# Patient Record
Sex: Female | Born: 1965 | Race: White | Hispanic: No | Marital: Married | Smoking: Former smoker
Health system: Southern US, Community
[De-identification: ages and names within clinical notes are randomized; demographics above are authoritative.]

## PROBLEM LIST (undated history)

## (undated) DIAGNOSIS — F1010 Alcohol abuse, uncomplicated: Secondary | ICD-10-CM

## (undated) DIAGNOSIS — I10 Essential (primary) hypertension: Secondary | ICD-10-CM

## (undated) DIAGNOSIS — Z90710 Acquired absence of both cervix and uterus: Secondary | ICD-10-CM

## (undated) DIAGNOSIS — F32A Depression, unspecified: Secondary | ICD-10-CM

## (undated) DIAGNOSIS — I480 Paroxysmal atrial fibrillation: Secondary | ICD-10-CM

## (undated) DIAGNOSIS — I4891 Unspecified atrial fibrillation: Secondary | ICD-10-CM

## (undated) HISTORY — DX: Essential (primary) hypertension: I10

## (undated) HISTORY — DX: Depression, unspecified: F32.A

## (undated) HISTORY — DX: Acquired absence of both cervix and uterus: Z90.710

## (undated) HISTORY — PX: ABDOMINAL HYSTERECTOMY: SHX81

## (undated) HISTORY — PX: CHOLECYSTECTOMY: SHX55

## (undated) HISTORY — PX: TONSILLECTOMY: SUR1361

## (undated) HISTORY — PX: TOTAL ABDOMINAL HYSTERECTOMY: SHX209

## (undated) HISTORY — PX: SMALL INTESTINE SURGERY: SHX150

---

## 2000-03-04 ENCOUNTER — Emergency Department (HOSPITAL_COMMUNITY): Admission: EM | Admit: 2000-03-04 | Discharge: 2000-03-04 | Payer: Self-pay | Admitting: Emergency Medicine

## 2000-03-05 ENCOUNTER — Other Ambulatory Visit (HOSPITAL_COMMUNITY): Admission: RE | Admit: 2000-03-05 | Discharge: 2000-03-18 | Payer: Self-pay | Admitting: Psychiatry

## 2001-09-26 ENCOUNTER — Emergency Department (HOSPITAL_COMMUNITY): Admission: EM | Admit: 2001-09-26 | Discharge: 2001-09-26 | Payer: Self-pay | Admitting: Emergency Medicine

## 2001-09-26 ENCOUNTER — Encounter: Payer: Self-pay | Admitting: Emergency Medicine

## 2001-09-27 ENCOUNTER — Emergency Department (HOSPITAL_COMMUNITY): Admission: EM | Admit: 2001-09-27 | Discharge: 2001-09-27 | Payer: Self-pay | Admitting: Emergency Medicine

## 2002-01-27 ENCOUNTER — Emergency Department (HOSPITAL_COMMUNITY): Admission: EM | Admit: 2002-01-27 | Discharge: 2002-01-28 | Payer: Self-pay | Admitting: *Deleted

## 2002-01-27 ENCOUNTER — Encounter: Payer: Self-pay | Admitting: Emergency Medicine

## 2002-01-28 ENCOUNTER — Emergency Department (HOSPITAL_COMMUNITY): Admission: EM | Admit: 2002-01-28 | Discharge: 2002-01-28 | Payer: Self-pay | Admitting: Emergency Medicine

## 2002-03-18 ENCOUNTER — Inpatient Hospital Stay (HOSPITAL_COMMUNITY): Admission: EM | Admit: 2002-03-18 | Discharge: 2002-03-19 | Payer: Self-pay | Admitting: Emergency Medicine

## 2020-10-18 ENCOUNTER — Encounter: Payer: Self-pay | Admitting: Internal Medicine

## 2020-10-18 ENCOUNTER — Other Ambulatory Visit: Payer: Self-pay

## 2020-10-18 ENCOUNTER — Ambulatory Visit (INDEPENDENT_AMBULATORY_CARE_PROVIDER_SITE_OTHER): Payer: 59 | Admitting: Internal Medicine

## 2020-10-18 VITALS — BP 143/90 | HR 81 | Temp 98.5°F | Resp 18 | Ht 65.0 in | Wt 262.0 lb

## 2020-10-18 DIAGNOSIS — K219 Gastro-esophageal reflux disease without esophagitis: Secondary | ICD-10-CM

## 2020-10-18 DIAGNOSIS — Z720 Tobacco use: Secondary | ICD-10-CM | POA: Diagnosis not present

## 2020-10-18 DIAGNOSIS — E669 Obesity, unspecified: Secondary | ICD-10-CM | POA: Insufficient documentation

## 2020-10-18 DIAGNOSIS — Z7689 Persons encountering health services in other specified circumstances: Secondary | ICD-10-CM

## 2020-10-18 DIAGNOSIS — F17210 Nicotine dependence, cigarettes, uncomplicated: Secondary | ICD-10-CM

## 2020-10-18 DIAGNOSIS — F320 Major depressive disorder, single episode, mild: Secondary | ICD-10-CM | POA: Diagnosis not present

## 2020-10-18 DIAGNOSIS — Z1231 Encounter for screening mammogram for malignant neoplasm of breast: Secondary | ICD-10-CM

## 2020-10-18 DIAGNOSIS — I10 Essential (primary) hypertension: Secondary | ICD-10-CM | POA: Insufficient documentation

## 2020-10-18 DIAGNOSIS — Z23 Encounter for immunization: Secondary | ICD-10-CM

## 2020-10-18 DIAGNOSIS — F322 Major depressive disorder, single episode, severe without psychotic features: Secondary | ICD-10-CM | POA: Insufficient documentation

## 2020-10-18 DIAGNOSIS — Z122 Encounter for screening for malignant neoplasm of respiratory organs: Secondary | ICD-10-CM

## 2020-10-18 DIAGNOSIS — Z1211 Encounter for screening for malignant neoplasm of colon: Secondary | ICD-10-CM

## 2020-10-18 DIAGNOSIS — F321 Major depressive disorder, single episode, moderate: Secondary | ICD-10-CM | POA: Insufficient documentation

## 2020-10-18 NOTE — Assessment & Plan Note (Signed)
Low-dose CT chest ordered

## 2020-10-18 NOTE — Assessment & Plan Note (Signed)
On Nexium 

## 2020-10-18 NOTE — Assessment & Plan Note (Signed)
Diet modification and moderate exercise/walking advised 

## 2020-10-18 NOTE — Assessment & Plan Note (Signed)
35 pack-year smoking history, currently smokes 1 cigarette in week.  Asked about quitting: confirms that she currently smokes cigarettes Advise to quit smoking: Educated about QUITTING to reduce the risk of cancer, cardio and cerebrovascular disease. Assess willingness: Unwilling to quit at this time, but is working on cutting back. Assist with counseling and pharmacotherapy: Counseled for 5 minutes and literature provided. Arrange for follow up: Follow up in 3 months and continue to offer help.

## 2020-10-18 NOTE — Assessment & Plan Note (Addendum)
On Wellbutrin, well-controlled Was initially started for smoking cessation Melatonin as needed for insomnia

## 2020-10-18 NOTE — Progress Notes (Signed)
New Patient Office Visit  Subjective:  Patient ID: Renee [name], female    DOB: [birth year]  Age: 54 y.o. MRN: [id number]  CC:  Chief Complaint  Patient presents with  . New Patient (Initial Visit)    new pt just moved here from [place]     HPI Renee [name] is a 54 year old female with past medical history of uncontrolled hypertension, GERD, depression and insomnia who presents for establishing care.  Patient has a history of uncontrolled hypertension, for which she takes multiple medications.  She states that she takes her medications regularly.  Her blood pressure was 143/90 in the office today.  She used to check her BP at home, but has not been checking it recently.  She denies any headache, dizziness, chest pain, dyspnea or palpitations.  She has 25-pack-year smoking history, but has been smoking only 1 cigarette in a week for the last 3 years.  Wellbutrin was started initially to help with smoking cessation, but she continued to take it as she was also suffering from mild depression and prefers to continue it for now.  She also takes Nexium for GERD.  She takes melatonin as needed for insomnia.  Patient has had 2 doses of COVID vaccine.  She received flu vaccine and Shingrix vaccine (first dose) in the office today.  Past Medical History:  Diagnosis Date  . Depression    [facility] 10/17/2020  . Hypertension    [employer] 10/17/2020    Past Surgical History:  Procedure Laterality Date  . ABDOMINAL HYSTERECTOMY N/A    [facility] 10/17/2020  . SMALL INTESTINE SURGERY N/A    [facility] 10/17/2020  . TONSILLECTOMY      History reviewed. No pertinent family history.  Social History   Socioeconomic History  . Marital status: Married    Spouse name: Not on file  . Number of children: Not on file  . Years of education: Not on file  . Highest education level: Not on file  Occupational History  . Not on file  Tobacco Use  . Smoking status: Current Some Day Smoker     Types: Cigarettes  . Smokeless tobacco: Never Used  . Tobacco comment: 35 pack-year  Substance and Sexual Activity  . Alcohol use: Yes  . Drug use: Never  . Sexual activity: Yes  Other Topics Concern  . Not on file  Social History Narrative  . Not on file   Social Determinants of Health   Financial Resource Strain:   . Difficulty of Paying Living Expenses: Not on file  Food Insecurity:   . Worried About Charity fundraiser in the Last Year: Not on file  . Ran Out of Food in the Last Year: Not on file  Transportation Needs:   . Lack of Transportation (Medical): Not on file  . Lack of Transportation (Non-Medical): Not on file  Physical Activity:   . Days of Exercise per Week: Not on file  . Minutes of Exercise per Session: Not on file  Stress:   . Feeling of Stress : Not on file  Social Connections:   . Frequency of Communication with Friends and Family: Not on file  . Frequency of Social Gatherings with Friends and Family: Not on file  . Attends Religious Services: Not on file  . Active Member of Clubs or Organizations: Not on file  . Attends Archivist Meetings: Not on file  . Marital Status: Not on file  Intimate Partner Violence:   . Fear  of Current or Ex-Partner: Not on file  . Emotionally Abused: Not on file  . Physically Abused: Not on file  . Sexually Abused: Not on file    ROS Review of Systems  Constitutional: Negative for chills and fever.  HENT: Negative for congestion, sinus pressure, sinus pain and sore throat.   Eyes: Negative for pain and discharge.  Respiratory: Negative for cough and shortness of breath.   Cardiovascular: Negative for chest pain and palpitations.  Gastrointestinal: Negative for abdominal pain, constipation, diarrhea, nausea and vomiting.  Endocrine: Negative for polydipsia and polyuria.  Genitourinary: Negative for dysuria and hematuria.  Musculoskeletal: Negative for neck pain and neck stiffness.  Skin: Negative for rash.   Neurological: Negative for dizziness and weakness.  Psychiatric/Behavioral: Negative for agitation and behavioral problems.    Objective:   Today's Vitals: BP (!) 143/90 (BP Location: Right Arm, Patient Position: Sitting, Cuff Size: Normal)   Pulse 81   Temp 98.5 F (36.9 C) (Oral)   Resp 18   Ht 5' 5"  (1.651 m)   Wt 262 lb (118.8 kg)   SpO2 98%   BMI 43.60 kg/m   Physical Exam Vitals reviewed.  Constitutional:      General: She is not in acute distress.    Appearance: She is obese. She is not diaphoretic.  HENT:     Head: Normocephalic and atraumatic.     Nose: Nose normal.     Mouth/Throat:     Mouth: Mucous membranes are moist.  Eyes:     General: No scleral icterus.    Extraocular Movements: Extraocular movements intact.     Pupils: Pupils are equal, round, and reactive to light.  Cardiovascular:     Rate and Rhythm: Normal rate and regular rhythm.     Pulses: Normal pulses.     Heart sounds: Normal heart sounds. No murmur heard.   Pulmonary:     Breath sounds: Normal breath sounds. No wheezing or rales.  Abdominal:     Palpations: Abdomen is soft.     Tenderness: There is no abdominal tenderness.  Musculoskeletal:     Cervical back: Neck supple. No tenderness.     Right lower leg: No edema.     Left lower leg: No edema.  Skin:    General: Skin is warm.     Findings: No rash.  Neurological:     General: No focal deficit present.     Mental Status: She is alert and oriented to person, place, and time.     Sensory: No sensory deficit.     Motor: No weakness.  Psychiatric:        Mood and Affect: Mood normal.        Behavior: Behavior normal.     Assessment & Plan:   Problem List Items Addressed This Visit      Cardiovascular and Mediastinum   Primary hypertension    BP Readings from Last 1 Encounters:  10/18/20 (!) 143/90   Elevated likely in the setting of anxiety/nervousness in a new place Advised to check BP at home and contact us if BP is  persistently elevated more than 140/90 On atenolol 50 mg daily, benazepril 40 mg daily, chlorthalidone 25 mg daily and clonidine 0.1 mg daily Counseled for compliance with the medications Advised DASH diet and moderate exercise/walking, at least 150 mins/week       Relevant Medications   benazepril (LOTENSIN) 40 MG tablet   cloNIDine (CATAPRES) 0.1 MG tablet   chlorthalidone (HYGROTON)  25 MG tablet   atenolol (TENORMIN) 50 MG tablet     Other   Encounter to establish care - Primary   Relevant Orders   CBC with Differential   CMP14+EGFR   Hemoglobin A1c   Lipid panel   TSH + free T4   Vitamin D (25 hydroxy)   Hepatitis C Antibody   Depression, major, single episode, mild (HCC)    On Wellbutrin, well-controlled Was initially started for smoking cessation      Relevant Medications   buPROPion (WELLBUTRIN XL) 300 MG 24 hr tablet   Tobacco abuse    35 pack-year smoking history, currently smokes 1 cigarette in week.  Asked about quitting: confirms that she currently smokes cigarettes Advise to quit smoking: Educated about QUITTING to reduce the risk of cancer, cardio and cerebrovascular disease. Assess willingness: Unwilling to quit at this time, but is working on cutting back. Assist with counseling and pharmacotherapy: Counseled for 5 minutes and literature provided. Arrange for follow up: Follow up in 3 months and continue to offer help.      Encounter for screening for lung cancer    Low-dose CT chest ordered      Relevant Orders   CT CHEST LUNG CA SCREEN LOW DOSE W/O CM    Other Visit Diagnoses    Special screening for malignant neoplasms, colon       Relevant Orders   Ambulatory referral to Gastroenterology   Screening mammogram for breast cancer       Relevant Orders   MM Digital Screening   Need for viral immunization       Relevant Orders   Varicella-zoster vaccine IM (Shingrix) (Completed)      Outpatient Encounter Medications as of 10/18/2020   Medication Sig  . atenolol (TENORMIN) 50 MG tablet Take 50 mg by mouth daily.  . benazepril (LOTENSIN) 40 MG tablet Take 40 mg by mouth daily. Take 1 tablet at bedtime  . bimatoprost (LATISSE) 0.03 % ophthalmic solution Place into both eyes at bedtime. Place one drop on applicator and apply evenly along the skin of the upper eyelid at base of eyelashes once daily at bedtime; repeat procedure for second eye (use a clean applicator).  Marland Kitchen buPROPion (WELLBUTRIN XL) 300 MG 24 hr tablet Take 300 mg by mouth daily. Take 1 daily  . chlorthalidone (HYGROTON) 25 MG tablet Take 25 mg by mouth daily. Take 1 tablet daily  . cloNIDine (CATAPRES) 0.1 MG tablet Take 0.1 mg by mouth daily. Take 1 tablet daily  . esomeprazole (NEXIUM) 40 MG capsule Take 40 mg by mouth daily at 12 noon. Take 1 daily  . melatonin 5 MG TABS Take 5 mg by mouth. At night as needed  . [DISCONTINUED] cloNIDine (CATAPRES - DOSED IN MG/24 HR) 0.1 mg/24hr patch Place 0.1 mg onto the skin once a week.   No facility-administered encounter medications on file as of 10/18/2020.    Follow-up: Return in about 4 months (around 02/16/2021).   Lindell Spar, MD

## 2020-10-18 NOTE — Assessment & Plan Note (Signed)
BP Readings from Last 1 Encounters:  10/18/20 (!) 143/90   Elevated likely in the setting of anxiety/nervousness in a new place Advised to check BP at home and contact us if BP is persistently elevated more than 140/90 On atenolol 50 mg daily, benazepril 40 mg daily, chlorthalidone 25 mg daily and clonidine 0.1 mg daily Counseled for compliance with the medications Advised DASH diet and moderate exercise/walking, at least 150 mins/week

## 2020-10-18 NOTE — Patient Instructions (Addendum)
Please continue to take medications as prescribed.  Please get fasting blood tests done soon.  You are being scheduled for the following: - Mammography for breast cancer screening - GI referral for colonoscopy - Low dose CT chest for lung cancer screening  You were given flu and Shingrix (1st dose) in the office today.  Please follow DASH diet and perform moderate exercise/walking at least 150 mins/week.  DASH stands for Dietary Approaches to Stop Hypertension. The DASH diet is a healthy-eating plan designed to help treat or prevent high blood pressure (hypertension).  The DASH diet includes foods that are rich in potassium, calcium and magnesium. These nutrients help control blood pressure. The diet limits foods that are high in sodium, saturated fat and added sugars.  Studies have shown that the DASH diet can lower blood pressure in as little as two weeks. The diet can also lower low-density lipoprotein (LDL or "bad") cholesterol levels in the blood. High blood pressure and high LDL cholesterol levels are two major risk factors for heart disease and stroke.    DASH diet: Recommended servings The DASH diet provides daily and weekly nutritional goals. The number of servings you should have depends on your daily calorie needs.  Here's a look at the recommended servings from each food group for a 2,000-calorie-a-day DASH diet:  Grains: 6 to 8 servings a day. One serving is one slice bread, 1 ounce dry cereal, or 1/2 cup cooked cereal, rice or pasta. Vegetables: 4 to 5 servings a day. One serving is 1 cup raw leafy green vegetable, 1/2 cup cut-up raw or cooked vegetables, or 1/2 cup vegetable juice. Fruits: 4 to 5 servings a day. One serving is one medium fruit, 1/2 cup fresh, frozen or canned fruit, or 1/2 cup fruit juice. Fat-free or low-fat dairy products: 2 to 3 servings a day. One serving is 1 cup milk or yogurt, or 1 1/2 ounces cheese. Lean meats, poultry and fish: six 1-ounce  servings or fewer a day. One serving is 1 ounce cooked meat, poultry or fish, or 1 egg. Nuts, seeds and legumes: 4 to 5 servings a week. One serving is 1/3 cup nuts, 2 tablespoons peanut butter, 2 tablespoons seeds, or 1/2 cup cooked legumes (dried beans or peas). Fats and oils: 2 to 3 servings a day. One serving is 1 teaspoon soft margarine, 1 teaspoon vegetable oil, 1 tablespoon mayonnaise or 2 tablespoons salad dressing. Sweets and added sugars: 5 servings or fewer a week. One serving is 1 tablespoon sugar, jelly or jam, 1/2 cup sorbet, or 1 cup lemonade.

## 2020-10-19 ENCOUNTER — Encounter (INDEPENDENT_AMBULATORY_CARE_PROVIDER_SITE_OTHER): Payer: Self-pay | Admitting: *Deleted

## 2020-10-25 ENCOUNTER — Other Ambulatory Visit (HOSPITAL_COMMUNITY): Payer: Self-pay

## 2020-10-25 ENCOUNTER — Encounter (HOSPITAL_COMMUNITY): Payer: Self-pay

## 2020-10-25 DIAGNOSIS — Z122 Encounter for screening for malignant neoplasm of respiratory organs: Secondary | ICD-10-CM

## 2020-10-25 DIAGNOSIS — Z87891 Personal history of nicotine dependence: Secondary | ICD-10-CM

## 2020-10-25 NOTE — Progress Notes (Signed)
Received referral for initial lung cancer screening scan. Contacted patient and obtained smoking history (started age 54 smoking 1/4 PPD until the age of 48 when she began smoking 1/2 PPD. At the age of 74, she began smoking 1PPD until the age of 67 when she stopped smoking, former smoker having quit 2 years ago, 33 pack year history) as well as answering questions related to the screening process. Patient denies signs/symptoms of lung cancer such as weight loss or hemoptysis. Patient denies comorbidity that would prevent curative treatment if lung cancer were to be found. Patient to be scheduled for [facility] and LDCT.

## 2020-10-25 NOTE — Progress Notes (Signed)
Patient scheduled for Lung Cancer Screening [facility] on 11/17/20 and initial LDCT on 11/18/20. Patient aware.

## 2020-11-09 ENCOUNTER — Ambulatory Visit (HOSPITAL_COMMUNITY): Payer: 59

## 2020-11-17 ENCOUNTER — Inpatient Hospital Stay (HOSPITAL_COMMUNITY): Payer: 59 | Attending: Oncology | Admitting: Oncology

## 2020-11-17 DIAGNOSIS — Z87891 Personal history of nicotine dependence: Secondary | ICD-10-CM | POA: Diagnosis not present

## 2020-11-17 NOTE — Progress Notes (Signed)
Virtual Visit via Video Note  I connected with Renee [name] on 11/17/20 at 10:50 AM EST by a video enabled telemedicine application and verified that I am speaking with the correct person using two identifiers.  Location: Patient: Home Provider: Clinic    I discussed the limitations of evaluation and management by telemedicine and the availability of in person appointments. The patient expressed understanding and agreed to proceed.  I discussed the assessment and treatment plan with the patient. The patient was provided an opportunity to ask questions and all were answered. The patient agreed with the plan and demonstrated an understanding of the instructions.   The patient was advised to call back or seek an in-person evaluation if the symptoms worsen or if the condition fails to improve as anticipated.   In accordance with CMS guidelines, patient has met eligibility criteria including age, absence of signs or symptoms of lung cancer.  Social History   Tobacco Use  . Smoking status: Current Some Day Smoker    Types: Cigarettes  . Smokeless tobacco: Never Used  . Tobacco comment: 35 pack-year  Substance Use Topics  . Alcohol use: Yes  . Drug use: Never      A shared decision-making session was conducted prior to the performance of CT scan. This includes one or more decision aids, includes benefits and harms of screening, follow-up diagnostic testing, over-diagnosis, false positive rate, and total radiation exposure.   Counseling on the importance of adherence to annual lung cancer LDCT screening, impact of co-morbidities, and ability or willingness to undergo diagnosis and treatment is imperative for compliance of the program.   Counseling on the importance of continued smoking cessation for former smokers; the importance of smoking cessation for current smokers, and information about tobacco cessation interventions have been given to patient including [place] and [address]  quit [place] programs.   Written order for lung cancer screening with LDCT has been given to the patient and any and all questions have been answered to the best of my abilities.    Yearly follow up will be coordinated by Burgess Estelle, Thoracic Navigator.  I provided 15 minutes of face-to-face video visit time during this encounter, and > 50% was spent counseling as documented under my assessment & plan.   Jacquelin Hawking, NP

## 2020-11-18 ENCOUNTER — Other Ambulatory Visit: Payer: Self-pay

## 2020-11-18 ENCOUNTER — Ambulatory Visit (HOSPITAL_COMMUNITY)
Admission: RE | Admit: 2020-11-18 | Discharge: 2020-11-18 | Disposition: A | Discharge status: Home / Self Care | Payer: 59 | Source: Ambulatory Visit | Attending: Oncology | Admitting: Oncology

## 2020-11-18 DIAGNOSIS — Z87891 Personal history of nicotine dependence: Secondary | ICD-10-CM | POA: Insufficient documentation

## 2020-11-18 DIAGNOSIS — Z122 Encounter for screening for malignant neoplasm of respiratory organs: Secondary | ICD-10-CM | POA: Diagnosis present

## 2020-11-21 ENCOUNTER — Encounter (HOSPITAL_COMMUNITY): Payer: Self-pay

## 2020-11-21 ENCOUNTER — Other Ambulatory Visit: Payer: Self-pay

## 2020-11-21 ENCOUNTER — Ambulatory Visit (HOSPITAL_COMMUNITY)
Admission: RE | Admit: 2020-11-21 | Discharge: 2020-11-21 | Disposition: A | Discharge status: Home / Self Care | Payer: 59 | Source: Ambulatory Visit | Attending: Internal Medicine | Admitting: Internal Medicine

## 2020-11-21 DIAGNOSIS — Z1231 Encounter for screening mammogram for malignant neoplasm of breast: Secondary | ICD-10-CM | POA: Insufficient documentation

## 2020-11-21 NOTE — Progress Notes (Signed)
Patient notified of LDCT Lung Cancer Screening Results via mail with the recommendation to follow-up in 12 months. Patient's referring provider has been sent a copy of results. Results are as follows:  IMPRESSION: 1. Lung-RADS 2, benign appearance or behavior. Continue annual screening with low-dose chest CT without contrast in 12 months. 2. Aortic Atherosclerosis (ICD10-I70.0) and Emphysema (ICD10-J43.9). 

## 2020-11-22 ENCOUNTER — Other Ambulatory Visit: Payer: Self-pay | Admitting: *Deleted

## 2020-11-22 MED ORDER — BENAZEPRIL HCL 40 MG PO TABS
40.0000 mg | ORAL_TABLET | Freq: Every day | ORAL | 2 refills | Status: DC
Start: 2020-11-22 — End: 2021-02-16

## 2020-11-22 MED ORDER — BIMATOPROST 0.03 % EX SOLN: 5.0000 mL | Freq: Every day | CUTANEOUS | 2 refills | Status: DC

## 2020-12-01 ENCOUNTER — Ambulatory Visit: Payer: 59

## 2020-12-06 ENCOUNTER — Telehealth: Payer: Self-pay

## 2020-12-06 NOTE — Telephone Encounter (Signed)
PT is calling to get her mammogram report. She has not heard anything

## 2020-12-06 NOTE — Telephone Encounter (Signed)
It appears that it has not been read yet. If she doesn't hear from them in a week, we will follow up with Radiology department.

## 2020-12-06 NOTE — Telephone Encounter (Signed)
Please advise 

## 2020-12-07 NOTE — Telephone Encounter (Signed)
Pt got results yesterday per pt

## 2020-12-16 LAB — CBC WITH DIFFERENTIAL/PLATELET
Basophils Absolute: 0 10*3/uL (ref 0.0–0.2)
Basos: 1 %
EOS (ABSOLUTE): 0.1 10*3/uL (ref 0.0–0.4)
Eos: 2 %
Hematocrit: 38.5 % (ref 34.0–46.6)
Hemoglobin: 13 g/dL (ref 11.1–15.9)
Immature Grans (Abs): 0 10*3/uL (ref 0.0–0.1)
Immature Granulocytes: 0 %
Lymphocytes Absolute: 2.2 10*3/uL (ref 0.7–3.1)
Lymphs: 35 %
MCH: 32.1 pg (ref 26.6–33.0)
MCHC: 33.8 g/dL (ref 31.5–35.7)
MCV: 95 fL (ref 79–97)
Monocytes Absolute: 0.4 10*3/uL (ref 0.1–0.9)
Monocytes: 6 %
Neutrophils Absolute: 3.4 10*3/uL (ref 1.4–7.0)
Neutrophils: 56 %
Platelets: 273 10*3/uL (ref 150–450)
RBC: 4.05 x10E6/uL (ref 3.77–5.28)
RDW: 11.8 % (ref 11.7–15.4)
WBC: 6.2 10*3/uL (ref 3.4–10.8)

## 2020-12-16 LAB — CMP14+EGFR
ALT: 32 IU/L (ref 0–32)
AST: 31 IU/L (ref 0–40)
Albumin/Globulin Ratio: 2 (ref 1.2–2.2)
Albumin: 4.6 g/dL (ref 3.8–4.9)
Alkaline Phosphatase: 99 IU/L (ref 44–121)
BUN/Creatinine Ratio: 30 — ABNORMAL HIGH (ref 9–23)
BUN: 28 mg/dL — ABNORMAL HIGH (ref 6–24)
Bilirubin Total: 0.3 mg/dL (ref 0.0–1.2)
CO2: 19 mmol/L — ABNORMAL LOW (ref 20–29)
Calcium: 9.7 mg/dL (ref 8.7–10.2)
Chloride: 103 mmol/L (ref 96–106)
Creatinine, Ser: 0.92 mg/dL (ref 0.57–1.00)
GFR calc Af Amer: 83 mL/min/{1.73_m2} (ref >59)
GFR calc non Af Amer: 72 mL/min/{1.73_m2} (ref >59)
Globulin, Total: 2.3 g/dL (ref 1.5–4.5)
Glucose: 103 mg/dL — ABNORMAL HIGH (ref 65–99)
Potassium: 4.4 mmol/L (ref 3.5–5.2)
Sodium: 138 mmol/L (ref 134–144)
Total Protein: 6.9 g/dL (ref 6.0–8.5)

## 2020-12-16 LAB — TSH+FREE T4
Free T4: 1.14 ng/dL (ref 0.82–1.77)
TSH: 3.66 u[IU]/mL (ref 0.450–4.500)

## 2020-12-16 LAB — VITAMIN D 25 HYDROXY (VIT D DEFICIENCY, FRACTURES): Vit D, 25-Hydroxy: 30.1 ng/mL (ref 30.0–100.0)

## 2020-12-16 LAB — HEPATITIS C ANTIBODY: Hep C Virus Ab: <0.1 s/co ratio (ref 0.0–0.9)

## 2020-12-19 ENCOUNTER — Other Ambulatory Visit: Payer: Self-pay | Admitting: Internal Medicine

## 2020-12-19 ENCOUNTER — Inpatient Hospital Stay
Admission: RE | Admit: 2020-12-19 | Discharge: 2020-12-19 | Disposition: A | Discharge status: Home / Self Care | Payer: Self-pay | Source: Ambulatory Visit | Attending: Internal Medicine | Admitting: Internal Medicine

## 2020-12-19 DIAGNOSIS — Z1231 Encounter for screening mammogram for malignant neoplasm of breast: Secondary | ICD-10-CM

## 2020-12-30 ENCOUNTER — Other Ambulatory Visit: Payer: Self-pay | Admitting: *Deleted

## 2020-12-30 ENCOUNTER — Telehealth: Payer: Self-pay

## 2020-12-30 MED ORDER — BUPROPION HCL ER (XL) 300 MG PO TB24: 300.0000 mg | ORAL_TABLET | Freq: Every day | ORAL | 0 refills | Status: DC

## 2020-12-30 NOTE — Telephone Encounter (Signed)
Pt is calling to get a refill on Bupropion --thanks

## 2020-12-30 NOTE — Telephone Encounter (Signed)
This medication has been sent to pt pharmacy  

## 2021-01-02 ENCOUNTER — Telehealth: Payer: Self-pay

## 2021-01-02 ENCOUNTER — Other Ambulatory Visit: Payer: Self-pay | Admitting: *Deleted

## 2021-01-02 MED ORDER — CHLORTHALIDONE 25 MG PO TABS: 25.0000 mg | ORAL_TABLET | Freq: Every day | ORAL | 0 refills | Status: DC

## 2021-01-02 MED ORDER — ATENOLOL 50 MG PO TABS: 50.0000 mg | ORAL_TABLET | Freq: Every day | ORAL | 0 refills | Status: DC

## 2021-01-02 MED ORDER — CLONIDINE HCL 0.1 MG PO TABS: 0.1000 mg | ORAL_TABLET | Freq: Every day | ORAL | 0 refills | Status: DC

## 2021-01-02 NOTE — Telephone Encounter (Signed)
Med refills: atenolol (TENORMIN) 50 MG  chlorthalidone (HYGROTON) 25 MG  cloNIDine (CATAPRES) 0.1 MG   Pharmacy:  [facility]

## 2021-01-02 NOTE — Telephone Encounter (Signed)
Pt medication sent to pharmacy  

## 2021-01-17 ENCOUNTER — Telehealth (INDEPENDENT_AMBULATORY_CARE_PROVIDER_SITE_OTHER): Payer: Self-pay

## 2021-01-17 ENCOUNTER — Other Ambulatory Visit (INDEPENDENT_AMBULATORY_CARE_PROVIDER_SITE_OTHER): Payer: Self-pay

## 2021-01-17 ENCOUNTER — Encounter (INDEPENDENT_AMBULATORY_CARE_PROVIDER_SITE_OTHER): Payer: Self-pay

## 2021-01-17 DIAGNOSIS — Z1211 Encounter for screening for malignant neoplasm of colon: Secondary | ICD-10-CM

## 2021-01-17 MED ORDER — PEG 3350-KCL-NA BICARB-NACL 420 G PO SOLR: 4000.0000 mL | ORAL | 0 refills | Status: DC

## 2021-01-17 NOTE — Telephone Encounter (Signed)
Renee [name], CMA  

## 2021-01-17 NOTE — Telephone Encounter (Signed)
Referring MD/PCP: Posey Pronto  Procedure: Tcs   Reason/Indication:  Screening  Has patient had this procedure before?  no  If so, when, by whom and where?    Is there a family history of colon cancer?  no  Who?  What age when diagnosed?    Is patient diabetic?   no      Does patient have prosthetic heart valve or mechanical valve?  no  Do you have a pacemaker/defibrillator?  no  Has patient ever had endocarditis/atrial fibrillation? no  Have you had a stroke/heart attack last 6 mths? no  Does patient use oxygen? no  Has patient had joint replacement within last 12 months?  no  Is patient constipated or do they take laxatives? no  Does patient have a history of alcohol/drug use?  no  Is patient on blood thinner such as Coumadin, Plavix and/or Aspirin? no  Do you take medicine for weight loss. no  Medications: Benazepril 40mg  daily, Clonidine 0.1 mg daily Bupriopion 300mg  daily, Atenolol 50 mg daily, Chlorthalidone 25mg  daily  Allergies: nkda  Procedure date & time: 01/26/21 / AM

## 2021-01-19 NOTE — Telephone Encounter (Signed)
Ok to schedule.

## 2021-01-24 ENCOUNTER — Other Ambulatory Visit (HOSPITAL_COMMUNITY)
Admission: RE | Admit: 2021-01-24 | Discharge: 2021-01-24 | Disposition: A | Discharge status: Home / Self Care | Payer: 59 | Source: Ambulatory Visit | Attending: Internal Medicine | Admitting: Internal Medicine

## 2021-01-24 ENCOUNTER — Other Ambulatory Visit: Payer: Self-pay

## 2021-01-24 DIAGNOSIS — Z01812 Encounter for preprocedural laboratory examination: Secondary | ICD-10-CM | POA: Diagnosis present

## 2021-01-24 DIAGNOSIS — Z1211 Encounter for screening for malignant neoplasm of colon: Secondary | ICD-10-CM

## 2021-01-24 DIAGNOSIS — Z20822 Contact with and (suspected) exposure to covid-19: Secondary | ICD-10-CM | POA: Diagnosis not present

## 2021-01-25 LAB — SARS CORONAVIRUS 2 (TAT 6-24 HRS): SARS Coronavirus 2: NEGATIVE

## 2021-01-26 ENCOUNTER — Ambulatory Visit (HOSPITAL_COMMUNITY)
Admission: RE | Admit: 2021-01-26 | Discharge: 2021-01-26 | Disposition: A | Discharge status: Home / Self Care | Payer: 59 | Attending: Internal Medicine | Admitting: Internal Medicine

## 2021-01-26 ENCOUNTER — Encounter (HOSPITAL_COMMUNITY)
Admission: RE | Disposition: A | Discharge status: Home / Self Care | Payer: Self-pay | Source: Home / Self Care | Attending: Internal Medicine

## 2021-01-26 ENCOUNTER — Other Ambulatory Visit: Payer: Self-pay

## 2021-01-26 ENCOUNTER — Encounter (HOSPITAL_COMMUNITY): Payer: Self-pay | Admitting: Internal Medicine

## 2021-01-26 DIAGNOSIS — Z791 Long term (current) use of non-steroidal anti-inflammatories (NSAID): Secondary | ICD-10-CM | POA: Diagnosis not present

## 2021-01-26 DIAGNOSIS — Z87891 Personal history of nicotine dependence: Secondary | ICD-10-CM | POA: Diagnosis not present

## 2021-01-26 DIAGNOSIS — Z79899 Other long term (current) drug therapy: Secondary | ICD-10-CM | POA: Diagnosis not present

## 2021-01-26 DIAGNOSIS — I10 Essential (primary) hypertension: Secondary | ICD-10-CM | POA: Diagnosis not present

## 2021-01-26 DIAGNOSIS — K5730 Diverticulosis of large intestine without perforation or abscess without bleeding: Secondary | ICD-10-CM | POA: Diagnosis not present

## 2021-01-26 DIAGNOSIS — Z1211 Encounter for screening for malignant neoplasm of colon: Secondary | ICD-10-CM

## 2021-01-26 DIAGNOSIS — Z9119 Patient's noncompliance with other medical treatment and regimen: Secondary | ICD-10-CM

## 2021-01-26 HISTORY — PX: COLONOSCOPY: SHX5424

## 2021-01-26 SURGERY — COLONOSCOPY
Anesthesia: Moderate Sedation

## 2021-01-26 MED ORDER — MEPERIDINE HCL 50 MG/ML IJ SOLN
INTRAMUSCULAR | Status: DC | PRN
  Administered 2021-01-26 (×4): 25 mg via INTRAVENOUS

## 2021-01-26 MED ORDER — SODIUM CHLORIDE 0.9 % IV SOLN: INTRAVENOUS | Status: DC

## 2021-01-26 MED ORDER — STERILE WATER FOR IRRIGATION IR SOLN: Status: DC | PRN

## 2021-01-26 MED ORDER — MEPERIDINE HCL 50 MG/ML IJ SOLN
INTRAMUSCULAR | Status: AC
  Filled 2021-01-26: qty 1

## 2021-01-26 MED ORDER — MIDAZOLAM HCL 5 MG/5ML IJ SOLN
INTRAMUSCULAR | Status: DC | PRN
  Administered 2021-01-26: 3 mg via INTRAVENOUS
  Administered 2021-01-26 (×3): 2 mg via INTRAVENOUS
  Administered 2021-01-26: 1 mg via INTRAVENOUS

## 2021-01-26 MED ORDER — MIDAZOLAM HCL 5 MG/5ML IJ SOLN
INTRAMUSCULAR | Status: AC
  Filled 2021-01-26: qty 10

## 2021-01-26 NOTE — H&P (Signed)
Renee [name] is an 55 y.o. female.   Chief Complaint: Patient is here for colonoscopy. HPI: Patient is 55 year old Caucasian female who is here for screening colonoscopy.  She believes she had colonoscopy 23 years ago by Dr. Arnoldo Morale when she had small bowel injury when she had abdominal hysterectomy. She denies abdominal pain change in bowel habits or rectal bleeding. She does not take aspirin or NSAIDs. Family history is negative for CRC.  Past Medical History:  Diagnosis Date  . Depression    Phreesia 10/17/2020  . Hypertension    [facility]reesia 10/17/2020    Past Surgical History:  Procedure Laterality Date  . ABDOMINAL HYSTERECTOMY 23 years ago N/A    [facility]reesia 10/17/2020   Small bowel resection 23 years ago for perforation at hysterectomy. N/A      . TONSILLECTOMY          laparoscopic cholecystectomy.  History reviewed. No pertinent family history. Social History:  reports that she quit smoking about 4 years ago. Her smoking use included cigarettes. She has never used smokeless tobacco. She reports current alcohol use of about 4.0 standard drinks of alcohol per week. She reports that she does not use drugs.  Allergies: No Known Allergies  Medications Prior to Admission  Medication Sig Dispense Refill  . acetaminophen (TYLENOL) 500 MG tablet Take 1,000 mg by mouth every 6 (six) hours as needed for mild pain or headache.    Marland Kitchen atenolol (TENORMIN) 50 MG tablet Take 1 tablet (50 mg total) by mouth daily. 30 tablet 0  . benazepril (LOTENSIN) 40 MG tablet Take 1 tablet (40 mg total) by mouth daily. Take 1 tablet at bedtime (Patient taking differently: Take 20 mg by mouth at bedtime.) 30 tablet 2  . buPROPion (WELLBUTRIN XL) 300 MG 24 hr tablet Take 1 tablet (300 mg total) by mouth daily. Take 1 daily 30 tablet 0  . chlorthalidone (HYGROTON) 25 MG tablet Take 1 tablet (25 mg total) by mouth daily. Take 1 tablet daily 30 tablet 0  . cloNIDine (CATAPRES) 0.1 MG tablet Take 1 tablet (0.1  mg total) by mouth daily. Take 1 tablet daily (Patient taking differently: Take 0.1 mg by mouth daily.) 60 tablet 0  . esomeprazole (NEXIUM) 20 MG capsule Take 20 mg by mouth daily at 12 noon. Take 1 daily    . fluticasone (FLONASE) 50 MCG/ACT nasal spray Place 2 sprays into both nostrils daily.    Marland Kitchen ibuprofen (ADVIL) 200 MG tablet Take 800 mg by mouth every 6 (six) hours as needed for mild pain or moderate pain (Back pain).    . melatonin 5 MG TABS Take 5 mg by mouth once a week.    . bimatoprost (LATISSE) 0.03 % ophthalmic solution Place 5 mLs into both eyes at bedtime. Place one drop on applicator and apply evenly along the skin of the upper eyelid at base of eyelashes once daily at bedtime; repeat procedure for second eye (use a clean applicator). (Patient not taking: Reported on 01/23/2021) 3 mL 2  . polyethylene glycol-electrolytes (TRILYTE) 420 g solution Take 4,000 mLs by mouth as directed. 4000 mL 0    Results for orders placed or performed during the hospital encounter of 01/24/21 (from the past 48 hour(s))  SARS CORONAVIRUS 2 (TAT 6-24 HRS) Nasopharyngeal Nasopharyngeal Swab     Status: None   Collection Time: 01/24/21  3:45 PM   Specimen: Nasopharyngeal Swab  Result Value Ref Range   SARS Coronavirus 2 NEGATIVE NEGATIVE  Comment: (NOTE) SARS-CoV-2 target nucleic acids are NOT DETECTED.  The SARS-CoV-2 RNA is generally detectable in upper and lower respiratory specimens during the acute phase of infection. Negative results do not preclude SARS-CoV-2 infection, do not rule out co-infections with other pathogens, and should not be used as the sole basis for treatment or other patient management decisions. Negative results must be combined with clinical observations, patient history, and epidemiological information. The expected result is Negative.  Fact Sheet for Patients: SugarRoll.be  Fact Sheet for Healthcare  Providers: https://www.woods-mathews.com/  This test is not yet approved or cleared by the [place] FDA and  has been authorized for detection and/or diagnosis of SARS-CoV-2 by FDA under an Emergency Use Authorization (EUA). This EUA will remain  in effect (meaning this test can be used) for the duration of the COVID-19 declaration under Se ction 564(b)(1) of the Act, 21 U.S.C. section 360bbb-3(b)(1), unless the authorization is terminated or revoked sooner.  Performed at [facility], [place] [address] [place], [place] [address]    No results found.  Review of Systems  Blood pressure (!) 147/82, temperature 97.8 F (36.6 C), temperature source Oral, resp. rate (!) 22, height 5\' 6"  (1.676 m), weight 117.9 kg, SpO2 96 %. Physical Exam HENT:     Mouth/Throat:     Mouth: Mucous membranes are moist.     Pharynx: Oropharynx is clear.  Eyes:     General: No scleral icterus.    Conjunctiva/sclera: Conjunctivae normal.  Cardiovascular:     Rate and Rhythm: Normal rate and regular rhythm.     Heart sounds: Normal heart sounds. No murmur heard.   Pulmonary:     Effort: Pulmonary effort is normal.     Breath sounds: Normal breath sounds.  Abdominal:     Comments: Abdomen is full.  Pfannenstiel scar.  She also has laparoscopy scars across upper abdomen.  Abdomen is soft and nontender with organomegaly or masses.  Musculoskeletal:        General: No swelling.     Cervical back: Neck supple.  Lymphadenopathy:     Cervical: No cervical adenopathy.  Skin:    General: Skin is warm and dry.  Neurological:     Mental Status: She is alert.      Assessment/Plan  Average risk screening colonoscopy.  Hildred Laser, MD 01/26/2021, 9:19 AM

## 2021-01-26 NOTE — Op Note (Signed)
[facility] Patient Name: Renee [name] Procedure Date: 01/26/2021 9:00 AM MRN: [id number] Date of Birth: 12-30-1965 Attending MD: Hildred Laser , MD CSN: [id number] Age: 55 Admit Type: Outpatient Procedure:                Colonoscopy Indications:              Screening for colorectal malignant neoplasm Providers:                Hildred Laser, MD, Gwenlyn Fudge, RN, Randa Spike, Technician Referring MD:             Lindell Spar, MD Medicines:                Meperidine 100 mg IV, Midazolam 10 mg IV Complications:            No immediate complications. Estimated Blood Loss:     Estimated blood loss: none. Procedure:                Pre-Anesthesia Assessment:                           - Prior to the procedure, a History and Physical                            was performed, and patient medications and                            allergies were reviewed. The patient's tolerance of                            previous anesthesia was also reviewed. The risks                            and benefits of the procedure and the sedation                            options and risks were discussed with the patient.                            All questions were answered, and informed consent                            was obtained. Prior Anticoagulants: The patient has                            taken no previous anticoagulant or antiplatelet                            agents. ASA Grade Assessment: II - A patient with                            mild systemic disease. After reviewing the risks  and benefits, the patient was deemed in                            satisfactory condition to undergo the procedure.                           After obtaining informed consent, the colonoscope                            was passed under direct vision. Throughout the                            procedure, the patient's blood pressure, pulse, and                             oxygen saturations were monitored continuously. The                            PCF-[id number] ([id number]) was introduced through the                            anus and advanced to the the cecum, identified by                            the ileocecal valve. The colonoscopy was somewhat                            difficult due to restricted mobility of the colon.                            Successful completion of the procedure was aided by                            using manual pressure, withdrawing and reinserting                            the scope and scope guide. The patient tolerated                            the procedure well. The quality of the bowel                            preparation was fair except the ascending colon was                            unsatisfactory. The ileocecal valve and the rectum                            were photographed. Scope In: 9:39:20 AM Scope Out: 9:57:44 AM Scope Withdrawal Time: 0 hours 2 minutes 26 seconds  Total Procedure Duration: 0 hours 18 minutes 24 seconds  Findings:      The perianal and digital rectal examinations were normal.      A single medium-mouthed diverticulum was  found in the sigmoid colon.      Extensive amounts of solid stool was found at the hepatic flexure, in       the ascending colon and in the cecum, precluding visualization.      The rectum, recto-sigmoid colon, sigmoid colon, descending colon,       splenic flexure and mid transverse colon appeared normal. Impression:               - Diverticulosis in the sigmoid colon. Single                            diverticulum at sigmoid colon                           - Stool at the hepatic flexure, in the ascending                            colon and in the cecum.                           - The rectum, recto-sigmoid colon, sigmoid colon,                            descending colon, splenic flexure and mid                            transverse colon are  normal.                           - No specimens collected. Moderate Sedation:      Moderate (conscious) sedation was administered by the endoscopy nurse       and supervised by the endoscopist. The following parameters were       monitored: oxygen saturation, heart rate, blood pressure, CO2       capnography and response to care. Total physician intraservice time was       28 minutes. Recommendation:           - Patient has a contact number available for                            emergencies. The signs and symptoms of potential                            delayed complications were discussed with the                            patient. Return to normal activities tomorrow.                            Written discharge instructions were provided to the                            patient.                           - Resume previous diet today.                           -  Continue present medications.                           - Repeat colonoscopy in 3 months after two day                            prep.. Procedure Code(s):        --- Professional ---                           [phone], Colonoscopy, flexible; diagnostic, including                            collection of specimen(s) by brushing or washing,                            when performed (separate procedure)                           99153, Moderate sedation; each additional 15                            minutes intraservice time                           G0500, Moderate sedation services provided by the                            same physician or other qualified health care                            professional performing a gastrointestinal                            endoscopic service that sedation supports,                            requiring the presence of an independent trained                            observer to assist in the monitoring of the                            patient's level of consciousness and  physiological                            status; initial 15 minutes of intra-service time;                            patient age 56 years or older (additional time may                            be reported with [phone], as appropriate) Diagnosis Code(s):        --- Professional ---  Z12.11, Encounter for screening for malignant                            neoplasm of colon                           K57.30, Diverticulosis of large intestine without                            perforation or abscess without bleeding CPT copyright 2019 American Medical Association. All rights reserved. The codes documented in this report are preliminary and upon coder review may  be revised to meet current compliance requirements. Hildred Laser, MD Hildred Laser, MD 01/26/2021 10:10:51 AM This report has been signed electronically. Number of Addenda: 0

## 2021-01-26 NOTE — Discharge Instructions (Signed)
Resume usual medications and diet as before No driving for 24 hours. Colonoscopy to be rescheduled after 2-day prep.  Office will call.     Left message at office, Lacretia Nicks office coordinator notified         Colonoscopy, Adult, Care After This sheet gives you information about how to care for yourself after your procedure. Your doctor may also give you more specific instructions. If you have problems or questions, call your doctor. What can I expect after the procedure? After the procedure, it is common to have:  A small amount of blood in your poop (stool) for 24 hours.  Some gas.  Mild cramping or bloating in your belly (abdomen). Follow these instructions at home: Eating and drinking  Drink enough fluid to keep your pee (urine) pale yellow.  Follow instructions from your doctor about what you cannot eat or drink.  Return to your normal diet as told by your doctor. Avoid heavy or fried foods that are hard to digest.   Activity  Rest as told by your doctor.  Do not sit for a long time without moving. Get up to take short walks every 1-2 hours. This is important. Ask for help if you feel weak or unsteady.  Return to your normal activities as told by your doctor. Ask your doctor what activities are safe for you. To help cramping and bloating:  Try walking around.  Put heat on your belly as told by your doctor. Use the heat source that your doctor recommends, such as a moist heat pack or a heating pad. ? Put a towel between your skin and the heat source. ? Leave the heat on for 20-30 minutes. ? Remove the heat if your skin turns bright red. This is very important if you are unable to feel pain, heat, or cold. You may have a greater risk of getting burned.   General instructions  If you were given a medicine to help you relax (sedative) during your procedure, it can affect you for many hours. Do not drive or use machinery until your doctor says that it is safe.  For the  first 24 hours after the procedure: ? Do not sign important documents. ? Do not drink alcohol. ? Do your daily activities more slowly than normal. ? Eat foods that are soft and easy to digest.  Take over-the-counter or prescription medicines only as told by your doctor.  Keep all follow-up visits as told by your doctor. This is important. Contact a doctor if:  You have blood in your poop 2-3 days after the procedure. Get help right away if:  You have more than a small amount of blood in your poop.  You see large clumps of tissue (blood clots) in your poop.  Your belly is swollen.  You feel like you may vomit (nauseous).  You vomit.  You have a fever.  You have belly pain that gets worse, and medicine does not help your pain. Summary  After the procedure, it is common to have a small amount of blood in your poop. You may also have mild cramping and bloating in your belly.  If you were given a medicine to help you relax (sedative) during your procedure, it can affect you for many hours. Do not drive or use machinery until your doctor says that it is safe.  Get help right away if you have a lot of blood in your poop, feel like you may vomit, have a fever, or  have more belly pain. This information is not intended to replace advice given to you by your health care provider. Make sure you discuss any questions you have with your health care provider. Document Revised: 09/04/2019 Document Reviewed: 05/25/2019 Elsevier Patient Education  2021 [place].      Diverticulosis  Diverticulosis is a condition that develops when small pouches (diverticula) form in the wall of the large intestine (colon). The colon is where water is absorbed and stool (feces) is formed. The pouches form when the inside layer of the colon pushes through weak spots in the outer layers of the colon. You may have a few pouches or many of them. The pouches usually do not cause problems unless they become  inflamed or infected. When this happens, the condition is called diverticulitis. What are the causes? The cause of this condition is not known. What increases the risk? The following factors may make you more likely to develop this condition:  Being older than age 24. Your risk for this condition increases with age. Diverticulosis is rare among people younger than age 76. By age 35, many people have it.  Eating a low-fiber diet.  Having frequent constipation.  Being overweight.  Not getting enough exercise.  Smoking.  Taking over-the-counter pain medicines, like aspirin and ibuprofen.  Having a family history of diverticulosis. What are the signs or symptoms? In most people, there are no symptoms of this condition. If you do have symptoms, they may include:  Bloating.  Cramps in the abdomen.  Constipation or diarrhea.  Pain in the lower left side of the abdomen. How is this diagnosed? Because diverticulosis usually has no symptoms, it is most often diagnosed during an exam for other colon problems. The condition may be diagnosed by:  Using a flexible scope to examine the colon (colonoscopy).  Taking an X-ray of the colon after dye has been put into the colon (barium enema).  Having a CT scan. How is this treated? You may not need treatment for this condition. Your health care provider may recommend treatment to prevent problems. You may need treatment if you have symptoms or if you previously had diverticulitis. Treatment may include:  Eating a high-fiber diet.  Taking a fiber supplement.  Taking a live bacteria supplement (probiotic).  Taking medicine to relax your colon.   Follow these instructions at home: Medicines  Take over-the-counter and prescription medicines only as told by your health care provider.  If told by your health care provider, take a fiber supplement or probiotic. Constipation prevention Your condition may cause constipation. To prevent or  treat constipation, you may need to:  Drink enough fluid to keep your urine pale yellow.  Take over-the-counter or prescription medicines.  Eat foods that are high in fiber, such as beans, whole grains, and fresh fruits and vegetables.  Limit foods that are high in fat and processed sugars, such as fried or sweet foods.   General instructions  Try not to strain when you have a bowel movement.  Keep all follow-up visits as told by your health care provider. This is important. Contact a health care provider if you:  Have pain in your abdomen.  Have bloating.  Have cramps.  Have not had a bowel movement in 3 days. Get help right away if:  Your pain gets worse.  Your bloating becomes very bad.  You have a fever or chills, and your symptoms suddenly get worse.  You vomit.  You have bowel movements that are  bloody or black.  You have bleeding from your rectum. Summary  Diverticulosis is a condition that develops when small pouches (diverticula) form in the wall of the large intestine (colon).  You may have a few pouches or many of them.  This condition is most often diagnosed during an exam for other colon problems.  Treatment may include increasing the fiber in your diet, taking supplements, or taking medicines. This information is not intended to replace advice given to you by your health care provider. Make sure you discuss any questions you have with your health care provider. Document Revised: 05/28/2019 Document Reviewed: 05/28/2019 Elsevier Patient Education  [place].

## 2021-01-30 ENCOUNTER — Other Ambulatory Visit: Payer: Self-pay | Admitting: Internal Medicine

## 2021-02-03 ENCOUNTER — Encounter (HOSPITAL_COMMUNITY): Payer: Self-pay | Admitting: Internal Medicine

## 2021-02-06 ENCOUNTER — Other Ambulatory Visit: Payer: Self-pay | Admitting: Internal Medicine

## 2021-02-16 ENCOUNTER — Encounter: Payer: Self-pay | Admitting: Internal Medicine

## 2021-02-16 ENCOUNTER — Other Ambulatory Visit: Payer: Self-pay

## 2021-02-16 ENCOUNTER — Ambulatory Visit (INDEPENDENT_AMBULATORY_CARE_PROVIDER_SITE_OTHER): Payer: 59 | Admitting: Internal Medicine

## 2021-02-16 VITALS — BP 120/83 | HR 79 | Resp 18 | Ht 66.0 in | Wt 261.0 lb

## 2021-02-16 DIAGNOSIS — I10 Essential (primary) hypertension: Secondary | ICD-10-CM | POA: Diagnosis not present

## 2021-02-16 DIAGNOSIS — G4700 Insomnia, unspecified: Secondary | ICD-10-CM

## 2021-02-16 DIAGNOSIS — Z114 Encounter for screening for human immunodeficiency virus [HIV]: Secondary | ICD-10-CM

## 2021-02-16 DIAGNOSIS — Z23 Encounter for immunization: Secondary | ICD-10-CM

## 2021-02-16 DIAGNOSIS — I700 Atherosclerosis of aorta: Secondary | ICD-10-CM | POA: Diagnosis not present

## 2021-02-16 MED ORDER — ATENOLOL 50 MG PO TABS: 50.0000 mg | ORAL_TABLET | Freq: Every day | ORAL | 1 refills | Status: DC

## 2021-02-16 MED ORDER — TRAZODONE HCL 50 MG PO TABS: 25.0000 mg | ORAL_TABLET | Freq: Every evening | ORAL | 3 refills | Status: DC | PRN

## 2021-02-16 MED ORDER — ROSUVASTATIN CALCIUM 5 MG PO TABS
5.0000 mg | ORAL_TABLET | Freq: Every day | ORAL | 3 refills | Status: DC
Start: 2021-02-16 — End: 2023-02-11

## 2021-02-16 MED ORDER — CLONIDINE HCL 0.1 MG PO TABS: 0.1000 mg | ORAL_TABLET | Freq: Every day | ORAL | 1 refills | Status: DC

## 2021-02-16 MED ORDER — CHLORTHALIDONE 25 MG PO TABS
25.0000 mg | ORAL_TABLET | Freq: Every day | ORAL | 1 refills | Status: DC
Start: 2021-02-16 — End: 2021-08-18

## 2021-02-16 MED ORDER — BENAZEPRIL HCL 40 MG PO TABS: 40.0000 mg | ORAL_TABLET | Freq: Every day | ORAL | 1 refills | Status: DC

## 2021-02-16 NOTE — Assessment & Plan Note (Signed)
Diet modification and moderate exercise/walking advised. Going to see Bariatric surgeon

## 2021-02-16 NOTE — Patient Instructions (Addendum)
Please start taking Trazodone for insomnia.  Please continue taking other medications as prescribed.  Continue to follow DASH diet and perform moderate exercise/walking at least 150 mins/week.  Please get gating blood tests done before the next visit.

## 2021-02-16 NOTE — Progress Notes (Signed)
Established Patient Office Visit  Subjective:  Patient ID: Renee [name], female    DOB: [birth year]  Age: 55 y.o. MRN: [id number]  CC:  Chief Complaint  Patient presents with  . Follow-up    4 month follow up     HPI Renee [name] is a 55 year old female with past medical history of uncontrolled hypertension, GERD, depression and insomnia who presents for follow up of her chronic medical conditions.  HTN: BP is well-controlled. Takes medications regularly. Patient denies headache, dizziness, chest pain, dyspnea or palpitations.  She had CT chest done, which showed aortic atherosclerosis and emphysema. She has quit smoking now.  Insomnia: Continues to have sleep problem. Has been taking Wellbutrin regularly. Denies anhedonia, SI or HI.  Past Medical History:  Diagnosis Date  . Depression    Phreesia 10/17/2020  . Hypertension    Phreesia 10/17/2020    Past Surgical History:  Procedure Laterality Date  . ABDOMINAL HYSTERECTOMY N/A    [facility]reesia 10/17/2020  . COLONOSCOPY N/A 01/26/2021   Procedure: COLONOSCOPY;  Surgeon: Rogene Houston, MD;  Location: [facility];  Service: Endoscopy;  Laterality: N/A;  AM  . SMALL INTESTINE SURGERY N/A    Phreesia 10/17/2020  . TONSILLECTOMY      History reviewed. No pertinent family history.  Social History   Socioeconomic History  . Marital status: Married    Spouse name: Not on file  . Number of children: Not on file  . Years of education: Not on file  . Highest education level: Not on file  Occupational History  . Not on file  Tobacco Use  . Smoking status: Former Smoker    Types: Cigarettes    Quit date: 2018    Years since quitting: 4.2  . Smokeless tobacco: Never Used  . Tobacco comment: 35 pack-year  Vaping Use  . Vaping Use: Never used  Substance and Sexual Activity  . Alcohol use: Yes    Alcohol/week: 4.0 standard drinks    Types: 4 Glasses of wine per week  . Drug use: Never  . Sexual activity: Yes   Other Topics Concern  . Not on file  Social History Narrative  . Not on file   Social Determinants of Health   Financial Resource Strain: Not on file  Food Insecurity: Not on file  Transportation Needs: Not on file  Physical Activity: Not on file  Stress: Not on file  Social Connections: Not on file  Intimate Partner Violence: Not on file    Outpatient Medications Prior to Visit  Medication Sig Dispense Refill  . acetaminophen (TYLENOL) 500 MG tablet Take 1,000 mg by mouth every 6 (six) hours as needed for mild pain or headache.    Marland Kitchen buPROPion (WELLBUTRIN XL) 300 MG 24 hr tablet Take 1 tablet by mouth once daily 30 tablet 0  . esomeprazole (NEXIUM) 20 MG capsule Take 20 mg by mouth daily at 12 noon. Take 1 daily    . fluticasone (FLONASE) 50 MCG/ACT nasal spray Place 2 sprays into both nostrils daily.    Marland Kitchen ibuprofen (ADVIL) 200 MG tablet Take 800 mg by mouth every 6 (six) hours as needed for mild pain or moderate pain (Back pain).    . melatonin 5 MG TABS Take 5 mg by mouth once a week.    Marland Kitchen atenolol (TENORMIN) 50 MG tablet Take 1 tablet by mouth once daily 30 tablet 0  . benazepril (LOTENSIN) 40 MG tablet Take 1 tablet (40 mg total)  by mouth daily. Take 1 tablet at bedtime (Patient taking differently: Take 40 mg by mouth at bedtime.) 30 tablet 2  . chlorthalidone (HYGROTON) 25 MG tablet Take 1 tablet by mouth once daily 30 tablet 0  . cloNIDine (CATAPRES) 0.1 MG tablet Take 1 tablet (0.1 mg total) by mouth daily. Take 1 tablet daily (Patient taking differently: Take 0.1 mg by mouth daily.) 60 tablet 0  . bimatoprost (LATISSE) 0.03 % ophthalmic solution Place 5 mLs into both eyes at bedtime. Place one drop on applicator and apply evenly along the skin of the upper eyelid at base of eyelashes once daily at bedtime; repeat procedure for second eye (use a clean applicator). (Patient not taking: No sig reported) 3 mL 2   No facility-administered medications prior to visit.    No Known  Allergies  ROS Review of Systems  Constitutional: Negative for chills and fever.  HENT: Negative for congestion, sinus pressure, sinus pain and sore throat.   Eyes: Negative for pain and discharge.  Respiratory: Negative for cough and shortness of breath.   Cardiovascular: Negative for chest pain and palpitations.  Gastrointestinal: Negative for abdominal pain, constipation, diarrhea, nausea and vomiting.  Endocrine: Negative for polydipsia and polyuria.  Genitourinary: Negative for dysuria and hematuria.  Musculoskeletal: Negative for neck pain and neck stiffness.  Skin: Negative for rash.  Neurological: Negative for dizziness and weakness.  Psychiatric/Behavioral: Positive for sleep disturbance. Negative for agitation and behavioral problems. The patient is nervous/anxious.       Objective:    Physical Exam Vitals reviewed.  Constitutional:      General: She is not in acute distress.    Appearance: She is obese. She is not diaphoretic.  HENT:     Head: Normocephalic and atraumatic.     Nose: Nose normal.     Mouth/Throat:     Mouth: Mucous membranes are moist.  Eyes:     General: No scleral icterus.    Extraocular Movements: Extraocular movements intact.     Pupils: Pupils are equal, round, and reactive to light.  Cardiovascular:     Rate and Rhythm: Normal rate and regular rhythm.     Pulses: Normal pulses.     Heart sounds: Normal heart sounds. No murmur heard.   Pulmonary:     Breath sounds: Normal breath sounds. No wheezing or rales.  Abdominal:     Palpations: Abdomen is soft.     Tenderness: There is no abdominal tenderness.  Musculoskeletal:     Cervical back: Neck supple. No tenderness.     Right lower leg: No edema.     Left lower leg: No edema.  Skin:    General: Skin is warm.     Findings: No rash.  Neurological:     General: No focal deficit present.     Mental Status: She is alert and oriented to person, place, and time.     Sensory: No sensory  deficit.     Motor: No weakness.  Psychiatric:        Mood and Affect: Mood normal.        Behavior: Behavior normal.     BP 120/83 (BP Location: Right Arm, Patient Position: Sitting, Cuff Size: Normal)   Pulse 79   Resp 18   Ht 5\' 6"  (1.676 m)   Wt 261 lb (118.4 kg)   SpO2 96%   BMI 42.13 kg/m  Wt Readings from Last 3 Encounters:  02/16/21 261 lb (118.4 kg)  01/26/21 260  lb (117.9 kg)  10/18/20 262 lb (118.8 kg)     There are no preventive care reminders to display for this patient.  There are no preventive care reminders to display for this patient.  Lab Results  Component Value Date   TSH 3.660 12/15/2020   Lab Results  Component Value Date   WBC 6.2 12/15/2020   HGB 13.0 12/15/2020   HCT 38.5 12/15/2020   MCV 95 12/15/2020   PLT 273 12/15/2020   Lab Results  Component Value Date   NA 138 12/15/2020   K 4.4 12/15/2020   CO2 19 (L) 12/15/2020   GLUCOSE 103 (H) 12/15/2020   BUN 28 (H) 12/15/2020   CREATININE 0.92 12/15/2020   BILITOT 0.3 12/15/2020   ALKPHOS 99 12/15/2020   AST 31 12/15/2020   ALT 32 12/15/2020   PROT 6.9 12/15/2020   ALBUMIN 4.6 12/15/2020   CALCIUM 9.7 12/15/2020   No results found for: CHOL No results found for: HDL No results found for: LDLCALC No results found for: TRIG No results found for: CHOLHDL No results found for: HGBA1C    Assessment & Plan:   Problem List Items Addressed This Visit      Cardiovascular and Mediastinum   Primary hypertension - Primary    BP Readings from Last 1 Encounters:  02/16/21 120/83   Well-controlled On atenolol 50 mg daily, benazepril 40 mg daily, chlorthalidone 25 mg daily and clonidine 0.1 mg daily Counseled for compliance with the medications Advised DASH diet and moderate exercise/walking, at least 150 mins/week       Relevant Medications   atenolol (TENORMIN) 50 MG tablet   benazepril (LOTENSIN) 40 MG tablet   chlorthalidone (HYGROTON) 25 MG tablet   cloNIDine (CATAPRES) 0.1  MG tablet   rosuvastatin (CRESTOR) 5 MG tablet   Other Relevant Orders   Lipid panel   Basic Metabolic Panel (BMET)   Aortic atherosclerosis ([place])    Noted on CT chest Started Crestor Check lipid profile      Relevant Medications   atenolol (TENORMIN) 50 MG tablet   benazepril (LOTENSIN) 40 MG tablet   chlorthalidone (HYGROTON) 25 MG tablet   cloNIDine (CATAPRES) 0.1 MG tablet   rosuvastatin (CRESTOR) 5 MG tablet   Other Relevant Orders   Lipid panel   Basic Metabolic Panel (BMET)     Other   Morbid obesity ([place])    Diet modification and moderate exercise/walking advised. Going to see Bariatric surgeon      Insomnia    Continue Wellbutrin Added Trazodone PRN      Relevant Medications   traZODone (DESYREL) 50 MG tablet    Other Visit Diagnoses    Encounter for screening for HIV       Relevant Orders   HIV antibody (with reflex)      Meds ordered this encounter  Medications  . traZODone (DESYREL) 50 MG tablet    Sig: Take 0.5-1 tablets (25-50 mg total) by mouth at bedtime as needed for sleep.    Dispense:  30 tablet    Refill:  3  . atenolol (TENORMIN) 50 MG tablet    Sig: Take 1 tablet (50 mg total) by mouth daily.    Dispense:  90 tablet    Refill:  1  . benazepril (LOTENSIN) 40 MG tablet    Sig: Take 1 tablet (40 mg total) by mouth daily. Take 1 tablet at bedtime    Dispense:  90 tablet    Refill:  1  .  chlorthalidone (HYGROTON) 25 MG tablet    Sig: Take 1 tablet (25 mg total) by mouth daily.    Dispense:  90 tablet    Refill:  1  . cloNIDine (CATAPRES) 0.1 MG tablet    Sig: Take 1 tablet (0.1 mg total) by mouth daily. Take 1 tablet daily    Dispense:  90 tablet    Refill:  1  . rosuvastatin (CRESTOR) 5 MG tablet    Sig: Take 1 tablet (5 mg total) by mouth daily.    Dispense:  90 tablet    Refill:  3    Follow-up: Return in about 4 months (around 06/18/2021) for HTN and HLD.    Lindell Spar, MD

## 2021-02-16 NOTE — Assessment & Plan Note (Signed)
BP Readings from Last 1 Encounters:  02/16/21 120/83   Well-controlled On atenolol 50 mg daily, benazepril 40 mg daily, chlorthalidone 25 mg daily and clonidine 0.1 mg daily Counseled for compliance with the medications Advised DASH diet and moderate exercise/walking, at least 150 mins/week

## 2021-02-16 NOTE — Assessment & Plan Note (Signed)
Noted on CT chest Started Crestor Check lipid profile

## 2021-02-16 NOTE — Assessment & Plan Note (Signed)
Continue Wellbutrin Added Trazodone PRN

## 2021-03-01 ENCOUNTER — Other Ambulatory Visit: Payer: Self-pay | Admitting: Internal Medicine

## 2021-03-15 ENCOUNTER — Encounter (HOSPITAL_COMMUNITY): Payer: Self-pay

## 2021-03-15 ENCOUNTER — Observation Stay (HOSPITAL_COMMUNITY)
Admission: EM | Admit: 2021-03-15 | Discharge: 2021-03-16 | Disposition: A | Discharge status: Home / Self Care | Payer: 59 | Attending: Internal Medicine | Admitting: Internal Medicine

## 2021-03-15 ENCOUNTER — Emergency Department (HOSPITAL_COMMUNITY): Payer: 59

## 2021-03-15 ENCOUNTER — Other Ambulatory Visit: Payer: Self-pay

## 2021-03-15 DIAGNOSIS — Z20822 Contact with and (suspected) exposure to covid-19: Secondary | ICD-10-CM | POA: Diagnosis not present

## 2021-03-15 DIAGNOSIS — E871 Hypo-osmolality and hyponatremia: Secondary | ICD-10-CM | POA: Diagnosis not present

## 2021-03-15 DIAGNOSIS — R1110 Vomiting, unspecified: Secondary | ICD-10-CM | POA: Diagnosis present

## 2021-03-15 DIAGNOSIS — Z79899 Other long term (current) drug therapy: Secondary | ICD-10-CM | POA: Insufficient documentation

## 2021-03-15 DIAGNOSIS — F321 Major depressive disorder, single episode, moderate: Secondary | ICD-10-CM | POA: Diagnosis present

## 2021-03-15 DIAGNOSIS — I10 Essential (primary) hypertension: Secondary | ICD-10-CM | POA: Diagnosis not present

## 2021-03-15 DIAGNOSIS — N179 Acute kidney failure, unspecified: Secondary | ICD-10-CM | POA: Diagnosis not present

## 2021-03-15 DIAGNOSIS — R112 Nausea with vomiting, unspecified: Secondary | ICD-10-CM | POA: Diagnosis not present

## 2021-03-15 DIAGNOSIS — E876 Hypokalemia: Secondary | ICD-10-CM | POA: Diagnosis not present

## 2021-03-15 DIAGNOSIS — E669 Obesity, unspecified: Secondary | ICD-10-CM | POA: Diagnosis present

## 2021-03-15 DIAGNOSIS — E66811 Obesity, class 1: Secondary | ICD-10-CM | POA: Diagnosis present

## 2021-03-15 DIAGNOSIS — F320 Major depressive disorder, single episode, mild: Secondary | ICD-10-CM | POA: Diagnosis present

## 2021-03-15 DIAGNOSIS — F322 Major depressive disorder, single episode, severe without psychotic features: Secondary | ICD-10-CM | POA: Diagnosis present

## 2021-03-15 DIAGNOSIS — Z87891 Personal history of nicotine dependence: Secondary | ICD-10-CM | POA: Diagnosis not present

## 2021-03-15 LAB — URINALYSIS, ROUTINE W REFLEX MICROSCOPIC
Bacteria, UA: NONE SEEN
Bilirubin Urine: NEGATIVE
Glucose, UA: NEGATIVE mg/dL
Ketones, ur: NEGATIVE mg/dL
Leukocytes,Ua: NEGATIVE
Nitrite: NEGATIVE
Protein, ur: NEGATIVE mg/dL
Specific Gravity, Urine: 1.014 (ref 1.005–1.030)
pH: 5 (ref 5.0–8.0)

## 2021-03-15 LAB — GLUCOSE, CAPILLARY: Glucose-Capillary: 95 mg/dL (ref 70–99)

## 2021-03-15 LAB — COMPREHENSIVE METABOLIC PANEL WITH GFR
ALT: 43 U/L (ref 0–44)
AST: 49 U/L — ABNORMAL HIGH (ref 15–41)
Albumin: 4.2 g/dL (ref 3.5–5.0)
Alkaline Phosphatase: 95 U/L (ref 38–126)
Anion gap: 15 (ref 5–15)
BUN: 14 mg/dL (ref 6–20)
CO2: 20 mmol/L — ABNORMAL LOW (ref 22–32)
Calcium: 8 mg/dL — ABNORMAL LOW (ref 8.9–10.3)
Chloride: 89 mmol/L — ABNORMAL LOW (ref 98–111)
Creatinine, Ser: 0.75 mg/dL (ref 0.44–1.00)
GFR, Estimated: >60 mL/min
Glucose, Bld: 95 mg/dL (ref 70–99)
Potassium: 3.8 mmol/L (ref 3.5–5.1)
Sodium: 124 mmol/L — ABNORMAL LOW (ref 135–145)
Total Bilirubin: 0.9 mg/dL (ref 0.3–1.2)
Total Protein: 7 g/dL (ref 6.5–8.1)

## 2021-03-15 LAB — RAPID URINE DRUG SCREEN, HOSP PERFORMED
Amphetamines: NOT DETECTED
Barbiturates: NOT DETECTED
Benzodiazepines: NOT DETECTED
Cocaine: NOT DETECTED
Opiates: NOT DETECTED
Tetrahydrocannabinol: NOT DETECTED

## 2021-03-15 LAB — CBC
HCT: 38.2 % (ref 36.0–46.0)
Hemoglobin: 13.5 g/dL (ref 12.0–15.0)
MCH: 32.2 pg (ref 26.0–34.0)
MCHC: 35.3 g/dL (ref 30.0–36.0)
MCV: 91.2 fL (ref 80.0–100.0)
Platelets: 292 K/uL (ref 150–400)
RBC: 4.19 MIL/uL (ref 3.87–5.11)
RDW: 11.9 % (ref 11.5–15.5)
WBC: 9.2 K/uL (ref 4.0–10.5)
nRBC: 0 % (ref 0.0–0.2)

## 2021-03-15 LAB — LIPASE, BLOOD: Lipase: 33 U/L (ref 11–51)

## 2021-03-15 MED ORDER — KCL IN DEXTROSE-NACL 20-5-0.9 MEQ/L-%-% IV SOLN: INTRAVENOUS | Status: DC

## 2021-03-15 MED ORDER — BENAZEPRIL HCL 10 MG PO TABS
40.0000 mg | ORAL_TABLET | Freq: Every day | ORAL | Status: DC
  Administered 2021-03-16: 40 mg via ORAL
  Filled 2021-03-15: qty 4

## 2021-03-15 MED ORDER — ONDANSETRON HCL 4 MG PO TABS
4.0000 mg | ORAL_TABLET | Freq: Four times a day (QID) | ORAL | Status: DC | PRN
  Administered 2021-03-16: 4 mg via ORAL
  Filled 2021-03-15: qty 1

## 2021-03-15 MED ORDER — CLONIDINE HCL 0.1 MG PO TABS
0.1000 mg | ORAL_TABLET | Freq: Every day | ORAL | Status: DC
  Administered 2021-03-16: 0.1 mg via ORAL
  Filled 2021-03-15: qty 1

## 2021-03-15 MED ORDER — KCL IN DEXTROSE-NACL 20-5-0.9 MEQ/L-%-% IV SOLN
INTRAVENOUS | Status: AC
  Filled 2021-03-15 (×2): qty 1000

## 2021-03-15 MED ORDER — ACETAMINOPHEN 650 MG RE SUPP: 650.0000 mg | Freq: Four times a day (QID) | RECTAL | Status: DC | PRN

## 2021-03-15 MED ORDER — SODIUM CHLORIDE 0.9 % IV BOLUS
1000.0000 mL | Freq: Once | INTRAVENOUS | Status: AC
  Administered 2021-03-15: 1000 mL via INTRAVENOUS

## 2021-03-15 MED ORDER — FLUTICASONE PROPIONATE 50 MCG/ACT NA SUSP
2.0000 | Freq: Every day | NASAL | Status: DC
  Administered 2021-03-16: 2 via NASAL
  Filled 2021-03-15: qty 16

## 2021-03-15 MED ORDER — BUPROPION HCL ER (XL) 300 MG PO TB24
300.0000 mg | ORAL_TABLET | Freq: Every day | ORAL | Status: DC
  Administered 2021-03-16: 300 mg via ORAL
  Filled 2021-03-15: qty 1

## 2021-03-15 MED ORDER — ENOXAPARIN SODIUM 60 MG/0.6ML IJ SOSY
60.0000 mg | PREFILLED_SYRINGE | INTRAMUSCULAR | Status: DC
  Administered 2021-03-15: 60 mg via SUBCUTANEOUS
  Filled 2021-03-15: qty 0.6

## 2021-03-15 MED ORDER — PANTOPRAZOLE SODIUM 40 MG IV SOLR
40.0000 mg | INTRAVENOUS | Status: DC
  Administered 2021-03-15: 40 mg via INTRAVENOUS
  Filled 2021-03-15 (×2): qty 40

## 2021-03-15 MED ORDER — ONDANSETRON HCL 4 MG/2ML IJ SOLN
4.0000 mg | Freq: Once | INTRAMUSCULAR | Status: AC
  Administered 2021-03-15: 4 mg via INTRAVENOUS
  Filled 2021-03-15: qty 2

## 2021-03-15 MED ORDER — ONDANSETRON HCL 4 MG/2ML IJ SOLN
4.0000 mg | Freq: Four times a day (QID) | INTRAMUSCULAR | Status: DC | PRN
  Administered 2021-03-15: 4 mg via INTRAVENOUS
  Filled 2021-03-15: qty 2

## 2021-03-15 MED ORDER — ONDANSETRON 4 MG PO TBDP
4.0000 mg | ORAL_TABLET | Freq: Once | ORAL | Status: AC
  Administered 2021-03-15: 4 mg via ORAL
  Filled 2021-03-15: qty 1

## 2021-03-15 MED ORDER — ATENOLOL 25 MG PO TABS
50.0000 mg | ORAL_TABLET | Freq: Every day | ORAL | Status: DC
  Administered 2021-03-16: 50 mg via ORAL
  Filled 2021-03-15: qty 2

## 2021-03-15 MED ORDER — POLYETHYLENE GLYCOL 3350 17 G PO PACK: 17.0000 g | PACK | Freq: Every day | ORAL | Status: DC | PRN

## 2021-03-15 MED ORDER — METOCLOPRAMIDE HCL 5 MG/ML IJ SOLN
10.0000 mg | Freq: Once | INTRAMUSCULAR | Status: AC
  Administered 2021-03-15: 10 mg via INTRAVENOUS
  Filled 2021-03-15: qty 2

## 2021-03-15 MED ORDER — ACETAMINOPHEN 325 MG PO TABS: 650.0000 mg | ORAL_TABLET | Freq: Four times a day (QID) | ORAL | Status: DC | PRN

## 2021-03-15 NOTE — ED Notes (Signed)
6 hall nurse refused to take report and stated next shift can get it.

## 2021-03-15 NOTE — ED Triage Notes (Signed)
Pt presents to ED with complaints of vomiting since 0300. Pt denies diarrhea or fever.

## 2021-03-15 NOTE — ED Notes (Signed)
Attempted IV x2. 

## 2021-03-15 NOTE — Plan of Care (Signed)

## 2021-03-15 NOTE — ED Provider Notes (Signed)
[facility] EMERGENCY DEPARTMENT Provider Note   CSN: [id number] Arrival date & time: 03/15/21  1020     History Chief Complaint  Patient presents with  . Emesis    Renee [name] is a 55 y.o. female.  Patient complains of persistent vomiting since 3 AM today.  No diarrhea no pain or fever  The history is provided by the patient and medical records.  Emesis Severity:  Moderate Timing:  Constant Quality:  Undigested food Able to tolerate:  Liquids Progression:  Unchanged Associated symptoms: no abdominal pain, no cough, no diarrhea and no headaches        Past Medical History:  Diagnosis Date  . Depression    [facility]reesia 10/17/2020  . Hypertension    Phreesia 10/17/2020    Patient Active Problem List   Diagnosis Date Noted  . Aortic atherosclerosis ([place]) 02/16/2021  . Insomnia 02/16/2021  . Primary hypertension 10/18/2020  . Depression, major, single episode, mild ([place]) 10/18/2020  . Tobacco abuse 10/18/2020  . Morbid obesity ([place]) 10/18/2020  . GERD (gastroesophageal reflux disease) 10/18/2020    Past Surgical History:  Procedure Laterality Date  . ABDOMINAL HYSTERECTOMY N/A    Phreesia 10/17/2020  . COLONOSCOPY N/A 01/26/2021   Procedure: COLONOSCOPY;  Surgeon: Rogene Houston, MD;  Location: [facility];  Service: Endoscopy;  Laterality: N/A;  AM  . SMALL INTESTINE SURGERY N/A    Phreesia 10/17/2020  . TONSILLECTOMY       OB History   No obstetric history on file.     No family history on file.  Social History   Tobacco Use  . Smoking status: Former Smoker    Types: Cigarettes    Quit date: 2018    Years since quitting: 4.3  . Smokeless tobacco: Never Used  . Tobacco comment: 35 pack-year  Vaping Use  . Vaping Use: Never used  Substance Use Topics  . Alcohol use: Yes    Alcohol/week: 4.0 standard drinks    Types: 4 Glasses of wine per week  . Drug use: Never    Home Medications Prior to Admission medications   Medication Sig  Start Date End Date Taking? Authorizing Provider  acetaminophen (TYLENOL) 500 MG tablet Take 1,000 mg by mouth every 6 (six) hours as needed for mild pain or headache.    [provider]  atenolol (TENORMIN) 50 MG tablet Take 1 tablet (50 mg total) by mouth daily. 02/16/21   Lindell Spar, MD  benazepril (LOTENSIN) 40 MG tablet Take 1 tablet (40 mg total) by mouth daily. Take 1 tablet at bedtime 02/16/21   Lindell Spar, MD  buPROPion (WELLBUTRIN XL) 300 MG 24 hr tablet Take 1 tablet by mouth once daily 03/01/21   Lindell Spar, MD  chlorthalidone (HYGROTON) 25 MG tablet Take 1 tablet (25 mg total) by mouth daily. 02/16/21   Lindell Spar, MD  cloNIDine (CATAPRES) 0.1 MG tablet Take 1 tablet (0.1 mg total) by mouth daily. Take 1 tablet daily 02/16/21   Lindell Spar, MD  esomeprazole (NEXIUM) 20 MG capsule Take 20 mg by mouth daily at 12 noon. Take 1 daily    [provider]  fluticasone (FLONASE) 50 MCG/ACT nasal spray Place 2 sprays into both nostrils daily.    [provider]  ibuprofen (ADVIL) 200 MG tablet Take 800 mg by mouth every 6 (six) hours as needed for mild pain or moderate pain (Back pain).    [provider]  melatonin  5 MG TABS Take 5 mg by mouth once a week.    [provider]  rosuvastatin (CRESTOR) 5 MG tablet Take 1 tablet (5 mg total) by mouth daily. 02/16/21   Lindell Spar, MD  traZODone (DESYREL) 50 MG tablet Take 0.5-1 tablets (25-50 mg total) by mouth at bedtime as needed for sleep. 02/16/21   Lindell Spar, MD    Allergies    Patient has no known allergies.  Review of Systems   Review of Systems  Constitutional: Negative for appetite change and fatigue.  HENT: Negative for congestion, ear discharge and sinus pressure.   Eyes: Negative for discharge.  Respiratory: Negative for cough.   Cardiovascular: Negative for chest pain.  Gastrointestinal: Positive for vomiting. Negative for abdominal pain and diarrhea.   Genitourinary: Negative for frequency and hematuria.  Musculoskeletal: Negative for back pain.  Skin: Negative for rash.  Neurological: Negative for seizures and headaches.  Psychiatric/Behavioral: Negative for hallucinations.    Physical Exam Updated Vital Signs BP (!) 146/83   Pulse 67   Temp 98.7 F (37.1 C) (Oral)   Resp 13   Ht 5\' 5"  (1.651 m)   Wt 117.9 kg   SpO2 100%   BMI 43.27 kg/m   Physical Exam Vitals and nursing note reviewed.  Constitutional:      Appearance: She is well-developed.  HENT:     Head: Normocephalic.     Nose: Nose normal.     Mouth/Throat:     Comments: Dry mucous membrane Eyes:     General: No scleral icterus.    Conjunctiva/sclera: Conjunctivae normal.  Neck:     Thyroid: No thyromegaly.  Cardiovascular:     Rate and Rhythm: Normal rate and regular rhythm.     Heart sounds: No murmur heard. No friction rub. No gallop.   Pulmonary:     Breath sounds: No stridor. No wheezing or rales.  Chest:     Chest wall: No tenderness.  Abdominal:     General: There is no distension.     Tenderness: There is no abdominal tenderness. There is no rebound.  Musculoskeletal:        General: Normal range of motion.     Cervical back: Neck supple.  Lymphadenopathy:     Cervical: No cervical adenopathy.  Skin:    Findings: No erythema or rash.  Neurological:     Mental Status: She is alert and oriented to person, place, and time.     Motor: No abnormal muscle tone.     Coordination: Coordination normal.  Psychiatric:        Behavior: Behavior normal.     ED Results / Procedures / Treatments   Labs (all labs ordered are listed, but only abnormal results are displayed) Labs Reviewed  COMPREHENSIVE METABOLIC PANEL - Abnormal; Notable for the following components:      Result Value   Sodium 124 (*)    Chloride 89 (*)    CO2 20 (*)    Calcium 8.0 (*)    AST 49 (*)    All other components within normal limits  URINALYSIS, ROUTINE W REFLEX  MICROSCOPIC - Abnormal; Notable for the following components:   Hgb urine dipstick SMALL (*)    All other components within normal limits  LIPASE, BLOOD  CBC    EKG None  Radiology DG ABD ACUTE 2+V W 1V CHEST  Result Date: 03/15/2021 CLINICAL DATA:  Nausea and vomiting for several hours, initial encounter EXAM: DG ABDOMEN  ACUTE WITH 1 VIEW CHEST COMPARISON:  None. FINDINGS: Cardiac shadows within normal limits. The lungs are clear bilaterally. Old rib fractures are noted on the right. Scattered large and small bowel gas is noted. No obstructive changes are seen. No free air is noted. No acute bony abnormality is seen. IMPRESSION: No acute abnormality in the chest and abdomen. Electronically Signed   By: Inez Catalina M.D.   On: 03/15/2021 15:09    Procedures Procedures   Medications Ordered in ED Medications  sodium chloride 0.9 % bolus 1,000 mL (0 mLs Intravenous Stopped 03/15/21 1440)  ondansetron (ZOFRAN) injection 4 mg (4 mg Intravenous Given 03/15/21 1353)  ondansetron (ZOFRAN-ODT) disintegrating tablet 4 mg (4 mg Oral Given 03/15/21 1130)  metoCLOPramide (REGLAN) injection 10 mg (10 mg Intravenous Given 03/15/21 1517)    ED Course  I have reviewed the triage vital signs and the nursing notes.  Pertinent labs & imaging results that were available during my care of the patient were reviewed by me and considered in my medical decision making (see chart for details).    MDM Rules/Calculators/A&P                        Patient with persistent vomiting and dehydration and hyponatremia she will be admitted to medicine Final Clinical Impression(s) / ED Diagnoses Final diagnoses:  None    Rx / DC Orders ED Discharge Orders    None       Milton Ferguson, MD 03/15/21 1528

## 2021-03-15 NOTE — H&P (Addendum)
History and Physical    Renee [name] KGM:[id number] DOB: [birth year] DOA: 03/15/2021  PCP: Lindell Spar, MD   Patient coming from: Home  Chief Complaint: Vomiting  HPI: Renee [name] is a 55 y.o. female with medical history significant for hypertension, depression and obesity. Patient presented to the ED complains of persistent vomiting since 3 AM this morning.  She reports multiple episodes since onset.  No blood, no black emesis.  No abdominal pain.  No fevers no chills.  She reports just 1 episode of loose stool here in the ED otherwise no diarrhea.  No prior episodes of vomiting.  No black stools or bloody stools.  No unusual foods.  No sick contacts and nobody with similar symptoms.  ED Course: 98.7 temperature.  Stable vitals.  Sodium low 124.  WBC 9.2.  Creatinine stable 0.75.  UA abdominal x-ray unremarkable.  EKG sinus rhythm.  ED despite Zofran x 2 and Reglan.  1 L bolus given.  Hospitalist to admit.  Review of Systems: As per HPI all other systems reviewed and negative.  Past Medical History:  Diagnosis Date  . Depression    Phreesia 10/17/2020  . Hypertension    Phreesia 10/17/2020    Past Surgical History:  Procedure Laterality Date  . ABDOMINAL HYSTERECTOMY N/A    Phreesia 10/17/2020  . COLONOSCOPY N/A 01/26/2021   Procedure: COLONOSCOPY;  Surgeon: Rogene [place], MD;  Location: [facility];  Service: Endoscopy;  Laterality: N/A;  AM  . SMALL INTESTINE SURGERY N/A    Phreesia 10/17/2020  . TONSILLECTOMY       reports that she quit smoking about 4 years ago. Her smoking use included cigarettes. She has never used smokeless tobacco. She reports current alcohol use of about 4.0 standard drinks of alcohol per week. She reports that she does not use drugs.  No Known Allergies  No pertinent family history.  Prior to Admission medications   Medication Sig Start Date End Date Taking? Authorizing Provider  acetaminophen (TYLENOL) 500 MG tablet Take 1,000 mg by  mouth every 6 (six) hours as needed for mild pain or headache.    [provider]  atenolol (TENORMIN) 50 MG tablet Take 1 tablet (50 mg total) by mouth daily. 02/16/21   Lindell Spar, MD  benazepril (LOTENSIN) 40 MG tablet Take 1 tablet (40 mg total) by mouth daily. Take 1 tablet at bedtime 02/16/21   Lindell Spar, MD  buPROPion (WELLBUTRIN XL) 300 MG 24 hr tablet Take 1 tablet by mouth once daily 03/01/21   Lindell Spar, MD  chlorthalidone (HYGROTON) 25 MG tablet Take 1 tablet (25 mg total) by mouth daily. 02/16/21   Lindell Spar, MD  cloNIDine (CATAPRES) 0.1 MG tablet Take 1 tablet (0.1 mg total) by mouth daily. Take 1 tablet daily 02/16/21   Lindell Spar, MD  esomeprazole (NEXIUM) 20 MG capsule Take 20 mg by mouth daily at 12 noon. Take 1 daily    [provider]  fluticasone (FLONASE) 50 MCG/ACT nasal spray Place 2 sprays into both nostrils daily.    [provider]  ibuprofen (ADVIL) 200 MG tablet Take 800 mg by mouth every 6 (six) hours as needed for mild pain or moderate pain (Back pain).    [provider]  melatonin 5 MG TABS Take 5 mg by mouth once a week.    [provider]  rosuvastatin (CRESTOR) 5 MG tablet Take 1 tablet (5 mg total) by mouth  daily. 02/16/21   Lindell Spar, MD  traZODone (DESYREL) 50 MG tablet Take 0.5-1 tablets (25-50 mg total) by mouth at bedtime as needed for sleep. 02/16/21   Lindell Spar, MD    Physical Exam: Vitals:   03/15/21 1130 03/15/21 1315 03/15/21 1330 03/15/21 1400  BP: (!) 148/88 100/89 124/82 (!) 146/83  Pulse: 72 69 64 67  Resp: 17 15 15 13   Temp:      TempSrc:      SpO2: 99% 99% 98% 100%  Weight:      Height:        Constitutional: NAD, calm, comfortable Vitals:   03/15/21 1130 03/15/21 1315 03/15/21 1330 03/15/21 1400  BP: (!) 148/88 100/89 124/82 (!) 146/83  Pulse: 72 69 64 67  Resp: 17 15 15 13   Temp:      TempSrc:      SpO2: 99% 99% 98% 100%  Weight:      Height:        Eyes: PERRL, lids and conjunctivae normal ENMT: Mucous membranes are moist. .  Neck: normal, supple, no masses, no thyromegaly Respiratory: clear to auscultation bilaterally, no wheezing, no crackles. Normal respiratory effort. No accessory muscle use.  Cardiovascular: Regular rate and rhythm, no murmurs / rubs / gallops. No extremity edema. 2+ pedal pulses.  Abdomen: no tenderness, no masses palpated. No hepatosplenomegaly.  Musculoskeletal: no clubbing / cyanosis. No joint deformity upper and lower extremities. Good ROM, no contractures. Skin: no rashes, lesions, ulcers. No induration Neurologic: No apparent cranial formality, moving extremities spontaneously. Psychiatric: Normal judgment and insight. Alert and oriented x 3. Normal mood.   Labs on Admission: I have personally reviewed following labs and imaging studies  CBC: Recent Labs  Lab 03/15/21 1125  WBC 9.2  HGB 13.5  HCT 38.2  MCV 91.2  PLT 867   Basic Metabolic Panel: Recent Labs  Lab 03/15/21 1125  NA 124*  K 3.8  CL 89*  CO2 20*  GLUCOSE 95  BUN 14  CREATININE 0.75  CALCIUM 8.0*   Liver Function Tests: Recent Labs  Lab 03/15/21 1125  AST 49*  ALT 43  ALKPHOS 95  BILITOT 0.9  PROT 7.0  ALBUMIN 4.2   Recent Labs  Lab 03/15/21 1125  LIPASE 33   Urine analysis:    Component Value Date/Time   COLORURINE YELLOW 03/15/2021 1128   APPEARANCEUR CLEAR 03/15/2021 1128   LABSPEC 1.014 03/15/2021 1128   PHURINE 5.0 03/15/2021 1128   GLUCOSEU NEGATIVE 03/15/2021 1128   HGBUR SMALL (A) 03/15/2021 1128   BILIRUBINUR NEGATIVE 03/15/2021 [place] 03/15/2021 [place] 03/15/2021 1128   NITRITE NEGATIVE 03/15/2021 [place] 03/15/2021 1128    Radiological Exams on Admission: DG ABD ACUTE 2+V W 1V CHEST  Result Date: 03/15/2021 CLINICAL DATA:  Nausea and vomiting for several hours, initial encounter EXAM: DG ABDOMEN ACUTE WITH 1 VIEW CHEST  COMPARISON:  None. FINDINGS: Cardiac shadows within normal limits. The lungs are clear bilaterally. Old rib fractures are noted on the right. Scattered large and small bowel gas is noted. No obstructive changes are seen. No free air is noted. No acute bony abnormality is seen. IMPRESSION: No acute abnormality in the chest and abdomen. Electronically Signed   By: Inez Catalina M.D.   On: 03/15/2021 15:09    EKG: Independently reviewed.  Sinus rhythm rate 71, QTc 486.  T waves appear more pronounced in inferior lateral leads.  No  prior EKG to compare.  Assessment/Plan Principal Problem:   Intractable vomiting Active Problems:   Hyponatremia   Primary hypertension   Depression, major, single episode, mild (HCC)   Morbid obesity (HCC)  Intractable vomiting- no leukocytosis, afebrile, benign abdominal exam.  Abdominal x-ray unremarkable. - 1L bolus given continue D5 Ns + 20 Kcl 100cc/hr x 20hrs -Zofran as needed -Bowel rest n.p.o. -Obtain UDS, rule out cannabis induced -Further imaging deferred for now, if persistent symptoms/pending clinical course consider. -IV Protonix 40 daily  Hyponatremia-sodium 124 last check 3 months ago sodium was 138.  Likely from multiple episodes of vomiting. - Hydrate -Hold chlorthalidone for now  Hypertension-stable. -Resume atenolol, clonidine 0.1 mg daily, benazepril. -Hold chlorthalidone while hydrating  Depression -Resume bupropion  Obesity  DVT prophylaxis: Lovenox Code Status: Full code Family Communication: Spouse at bedside Disposition Plan: ~ 1-2 days Consults called: None Admission status: Obs med surg   Bethena Roys MD [facility]  03/15/2021, 5:04 PM

## 2021-03-16 DIAGNOSIS — F320 Major depressive disorder, single episode, mild: Secondary | ICD-10-CM | POA: Diagnosis not present

## 2021-03-16 DIAGNOSIS — R112 Nausea with vomiting, unspecified: Secondary | ICD-10-CM | POA: Diagnosis not present

## 2021-03-16 DIAGNOSIS — E871 Hypo-osmolality and hyponatremia: Secondary | ICD-10-CM | POA: Diagnosis not present

## 2021-03-16 DIAGNOSIS — E876 Hypokalemia: Secondary | ICD-10-CM

## 2021-03-16 DIAGNOSIS — I10 Essential (primary) hypertension: Secondary | ICD-10-CM

## 2021-03-16 DIAGNOSIS — N179 Acute kidney failure, unspecified: Secondary | ICD-10-CM

## 2021-03-16 LAB — CBC
HCT: 35.8 % — ABNORMAL LOW (ref 36.0–46.0)
Hemoglobin: 12.6 g/dL (ref 12.0–15.0)
MCH: 33.3 pg (ref 26.0–34.0)
MCHC: 35.2 g/dL (ref 30.0–36.0)
MCV: 94.7 fL (ref 80.0–100.0)
Platelets: 195 10*3/uL (ref 150–400)
RBC: 3.78 MIL/uL — ABNORMAL LOW (ref 3.87–5.11)
RDW: 12.3 % (ref 11.5–15.5)
WBC: 7.8 10*3/uL (ref 4.0–10.5)
nRBC: 0 % (ref 0.0–0.2)

## 2021-03-16 LAB — COMPREHENSIVE METABOLIC PANEL
ALT: 38 U/L (ref 0–44)
AST: 55 U/L — ABNORMAL HIGH (ref 15–41)
Albumin: 4.1 g/dL (ref 3.5–5.0)
Alkaline Phosphatase: 92 U/L (ref 38–126)
Anion gap: 12 (ref 5–15)
BUN: 9 mg/dL (ref 6–20)
CO2: 22 mmol/L (ref 22–32)
Calcium: 7.8 mg/dL — ABNORMAL LOW (ref 8.9–10.3)
Chloride: 98 mmol/L (ref 98–111)
Creatinine, Ser: 1.01 mg/dL — ABNORMAL HIGH (ref 0.44–1.00)
GFR, Estimated: >60 mL/min (ref >60)
Glucose, Bld: 126 mg/dL — ABNORMAL HIGH (ref 70–99)
Potassium: 3.3 mmol/L — ABNORMAL LOW (ref 3.5–5.1)
Sodium: 132 mmol/L — ABNORMAL LOW (ref 135–145)
Total Bilirubin: 1.2 mg/dL (ref 0.3–1.2)
Total Protein: 6.9 g/dL (ref 6.5–8.1)

## 2021-03-16 LAB — BASIC METABOLIC PANEL
Anion gap: 12 (ref 5–15)
BUN: 10 mg/dL (ref 6–20)
CO2: 26 mmol/L (ref 22–32)
Calcium: 7.9 mg/dL — ABNORMAL LOW (ref 8.9–10.3)
Chloride: 94 mmol/L — ABNORMAL LOW (ref 98–111)
Creatinine, Ser: 0.93 mg/dL (ref 0.44–1.00)
GFR, Estimated: >60 mL/min (ref >60)
Glucose, Bld: 105 mg/dL — ABNORMAL HIGH (ref 70–99)
Potassium: 3.2 mmol/L — ABNORMAL LOW (ref 3.5–5.1)
Sodium: 132 mmol/L — ABNORMAL LOW (ref 135–145)

## 2021-03-16 LAB — HEMOGLOBIN A1C
Hgb A1c MFr Bld: 5.5 % (ref 4.8–5.6)
Mean Plasma Glucose: 111.15 mg/dL

## 2021-03-16 LAB — PHOSPHORUS: Phosphorus: 3.3 mg/dL (ref 2.5–4.6)

## 2021-03-16 LAB — MAGNESIUM: Magnesium: 0.9 mg/dL — CL (ref 1.7–2.4)

## 2021-03-16 LAB — SARS CORONAVIRUS 2 (TAT 6-24 HRS): SARS Coronavirus 2: NEGATIVE

## 2021-03-16 MED ORDER — POTASSIUM CHLORIDE 10 MEQ/100ML IV SOLN
10.0000 meq | INTRAVENOUS | Status: DC
  Administered 2021-03-16: 10 meq via INTRAVENOUS
  Filled 2021-03-16: qty 100

## 2021-03-16 MED ORDER — MAGNESIUM SULFATE 4 GM/100ML IV SOLN
4.0000 g | Freq: Once | INTRAVENOUS | Status: AC
  Administered 2021-03-16: 4 g via INTRAVENOUS
  Filled 2021-03-16: qty 100

## 2021-03-16 MED ORDER — POTASSIUM CHLORIDE IN NACL 40-0.9 MEQ/L-% IV SOLN: INTRAVENOUS | Status: DC

## 2021-03-16 MED ORDER — MELATONIN 3 MG PO TABS
6.0000 mg | ORAL_TABLET | Freq: Once | ORAL | Status: AC
  Administered 2021-03-16: 6 mg via ORAL
  Filled 2021-03-16: qty 2

## 2021-03-16 MED ORDER — SODIUM CHLORIDE 0.9 % IV BOLUS: 500.0000 mL | Freq: Once | INTRAVENOUS | Status: DC

## 2021-03-16 MED ORDER — POTASSIUM CHLORIDE CRYS ER 20 MEQ PO TBCR
40.0000 meq | EXTENDED_RELEASE_TABLET | Freq: Two times a day (BID) | ORAL | Status: DC
  Administered 2021-03-16: 40 meq via ORAL
  Filled 2021-03-16: qty 2

## 2021-03-16 MED ORDER — PROCHLORPERAZINE EDISYLATE 10 MG/2ML IJ SOLN
10.0000 mg | Freq: Four times a day (QID) | INTRAMUSCULAR | Status: DC | PRN
  Administered 2021-03-16 (×2): 10 mg via INTRAVENOUS
  Filled 2021-03-16 (×3): qty 2

## 2021-03-16 NOTE — Progress Notes (Addendum)
Patient IV has been saying occluded all night, we have tried new dressing, new channel, and attempted x2 to get a new IV in which was unsuccessful. I notified the House supervisor to see if there was anyone here that could use Ultrasound to place IV. She stated there was not and that I needed to call the doctor and ask him to get an order for a PICC line or for the IV team to come place the IV since there will not be a PICC team here today. I am notifying MD Adefeso right now. I will follow up.   Spoke with MD Josephine Cables, he stated to defer to AM team. I will let Am Nurse know.

## 2021-03-16 NOTE — Progress Notes (Signed)
Went into patient's room to place an IV. Patient stated that she wanted to leave because her daughter was graduating from college [place] the beach. I talked with her about staying until the morning. I secure chatted Dr. Alfredia Ferguson and asked to call into her room at patient's request. He called into room and talked with patient. Patient agreed to stay until tomorrow morning. Then a few minutes later she said she was leaving AMA. I secure chatted the doctor again to let him know the patient was leaving. Her husband arrived and she signed the [facility] paperwork and walked out with her husband. I advised her to drink some Gatorade /power aide and rest.

## 2021-03-16 NOTE — Discharge Summary (Signed)
Physician Discharge Summary  Renee [name] YQI:[id number] DOB: [birth year] DOA: 03/15/2021  PCP: Lindell Spar, MD  Admit date: 03/15/2021 Discharge date: 03/16/2021  Admitted From: Home Disposition: Left AMA  Recommendations for Outpatient Follow-up:  1. Follow up with PCP in 1-2 weeks 2. Please obtain CMP/CBC, Mag, Phos in one week 3. Please follow up on the following pending results:  Home Health: No Equipment/Devices: None  Discharge Condition: Guarded CODE STATUS: FULL COD Diet recommendation: Soft Diet   Brief/Interim Summary: Patient is a 55 year old morbidly obese Caucasian female with a past medical history significant for but not limited to hypertension, depression and anxiety, as well as other comorbidities who presented to the ED with persistent vomiting since 3 AM.  She reports multiple episodes since her onset with no blood or black emesis noted.  She is also reports 5 loose stools which are getting better.  She states that she ate [employer] the day before and thinks that she may have gotten food poisoning.  She denies any black or bloody stools.  Denies any sick contacts and nobody else has the similar symptoms.  In the ED she was noted to have a stable WBC and creatinine of 0.75.  UA and abdominal imaging was unremarkable.  She is given Zofran x2 in the ED as well as Reglan.  Also noted to give a 1 L bolus in the ED.  She was admitted for intractable nausea vomiting and had severe electrolyte abnormalities and these are currently being repleted but unfortunatley she lost IV Access. Upon repeat Labs she had a worsened Renal Fxn and K+ was still low. Patient had a family commitment tomorrow evening and did not want to stay and wanted to be discharged. I explained to her she was not deemed medically stable to be discharged and she, being of sound mind, decided to leave AMA.   Discharge Diagnoses:  Principal Problem:   Intractable vomiting Active Problems:   Primary  hypertension   Depression, major, single episode, mild (HCC)   Morbid obesity (HCC)   Hyponatremia  Intractable Nausea, Vomiting, and Associated Diarrhea likely in the setting of Viral Gastroenteritis, improving slowly  -Improving -Patient was a little Nauseous this AM and Dry heaving -Continues to have no Leukocytosis and Abd Exam remains benign with mild tenderness -KUB and CXR done and showed "No acute abnormality in the chest and abdomen." -C/w IVF Hydration with D5 NS + 20 mEQ of KCl at 100 mL/hr for 20 Hours -Given a 1 Liter NS bolus yesterday and will give a 500 mL Bolus today -UDS unremarkable -Further Imaging deferred given that she is improving -C/w IV Pantoprazole 40 mg q24h -C/w Antiemetics p.o./IV Zofran every 6 as needed for nausea as well as prochlorperazine 10 mg IV every 6 current for nausea vomiting and refractory nausea vomiting -Patient received a dose of metoclopramide 10 mg IV once in the ED -Initially put on Bowel rest but Diet advanced to a Soft Diet given that patient wanted to try  -IF improved and electrolytes are replete and tolerating soft Diet can Discharge Home however they did not. Patient now with AKI. She wanted to be discharged but I discussed with her that I didn't feel she was medically stable and that she would benefit from overnight hydration and further electrolyte repletion but patient wanted to leave to go to her daughter's graduation tomorrow. She subsequently being of sound mind Left AMA.   Hypertension -Holding home chlorthalidone given that it likely contributed to her  electrolyte abnormalities -Continued with benazepril 40 mg p.o. daily until renal fxn came back elevated this afternoon -C/w Atenolol 50 mg daily as well as clonidine 0.1 mg p.o. daily -Continue to monitor blood pressures per protocol -Last BP reading was 113/83 -If Necessary will add IV Hydralazine 10 mg q6hprn for SBP>160 or DBP>100  Hypokalemia -Mild in the setting of  Nausea and Vomiting and Diarrhea -K+ went from 3.8 -> 3.2 -> 3.3 -Replete with D5 NS + 20 mEQ of KCl at 100 mL/hr and with IV KCl 40 mEQ but only had 1 run so will stop and replete with po KCl 40 mEQ BID x2 -Mag Level was low as below and will be replete -Continue to Monitor and Replete as Necessary -Repeat CMP in the AM but she left AMA  AKI -Paitent's BUN/Cr went from 14/0.75 -> 10/0.93 -> 9/1.01 -Was to get IVF hydration but Lost IV; Nursing to replace IV -Discontinued Benazepril and had Held Chlorthalidone given Hyponatremia  -Avoid Nephrotoxic Medications, Contrast Dyes, and Hypotension -Wanted to Repeat CMP in the AM but patient left AMA  Depression and Anxiety -Continue with Bupropion 300 mg p.o. daily  Hyponatremia, improving  -Patient's Na+ was 124 and likely acute in the setting of her Intractable Nausea and Vomiting -Na+ was 138 on last check 3 months ago; repeat this AM was 132 as well as this Afternoon -C/w IVF Hydration with D5 NS + 20 mEQ of KCl at 100 mL/hr for 20 Hours -Given a 1 Liter NS bolus yesterday and will give a 500 mL Bolus today but unfortunately lost IV Access -Continue to Monitor and Trend and wanted to repeat CMP in the AM but she left AMA prior to Doing So  Hypomagnesemia -Patient's magnesium level was 0.9 this morning -Replete with IV mag sulfate 4 g -Continue to monitor and replete as necessary -Wanted to Repeat magnesium level in a.m but Patient left AMA  Morbid Obesity -Complicates overall prognosis and care -Estimated body mass index is 43.27 kg/m as calculated from the following:   Height as of this encounter: 5\' 5"  (1.651 m).   Weight as of this encounter: 117.9 kg. -Weight Loss and Dietary Counseling given   Discharge Instructions   Allergies as of 03/16/2021   No Known Allergies     Medication List    TAKE these medications   acetaminophen 500 MG tablet Commonly known as: TYLENOL Take 1,000 mg by mouth every 6 (six) hours as  needed for mild pain or headache.   atenolol 50 MG tablet Commonly known as: TENORMIN Take 1 tablet (50 mg total) by mouth daily.   benazepril 40 MG tablet Commonly known as: LOTENSIN Take 1 tablet (40 mg total) by mouth daily. Take 1 tablet at bedtime   buPROPion 300 MG 24 hr tablet Commonly known as: WELLBUTRIN XL Take 1 tablet by mouth once daily   chlorthalidone 25 MG tablet Commonly known as: HYGROTON Take 1 tablet (25 mg total) by mouth daily.   cloNIDine 0.1 MG tablet Commonly known as: CATAPRES Take 1 tablet (0.1 mg total) by mouth daily. Take 1 tablet daily   esomeprazole 20 MG capsule Commonly known as: NEXIUM Take 20 mg by mouth daily at 12 noon. Take 1 daily   fluticasone 50 MCG/ACT nasal spray Commonly known as: FLONASE Place 2 sprays into both nostrils daily.   ibuprofen 200 MG tablet Commonly known as: ADVIL Take 800 mg by mouth every 6 (six) hours as needed for mild pain or moderate  pain (Back pain).   melatonin 5 MG Tabs Take 5 mg by mouth once a week.   multivitamin tablet Take 1 tablet by mouth daily.   rosuvastatin 5 MG tablet Commonly known as: Crestor Take 1 tablet (5 mg total) by mouth daily.   traZODone 50 MG tablet Commonly known as: DESYREL Take 0.5-1 tablets (25-50 mg total) by mouth at bedtime as needed for sleep.       No Known Allergies  Consultations:  None  Procedures/Studies: DG ABD ACUTE 2+V W 1V CHEST  Result Date: 03/15/2021 CLINICAL DATA:  Nausea and vomiting for several hours, initial encounter EXAM: DG ABDOMEN ACUTE WITH 1 VIEW CHEST COMPARISON:  None. FINDINGS: Cardiac shadows within normal limits. The lungs are clear bilaterally. Old rib fractures are noted on the right. Scattered large and small bowel gas is noted. No obstructive changes are seen. No free air is noted. No acute bony abnormality is seen. IMPRESSION: No acute abnormality in the chest and abdomen. Electronically Signed   By: Inez Catalina M.D.   On:  03/15/2021 15:09    Subjective: Seen and examined this morning and she was a little bit nauseous and had some dry heaving "a lot better.  Abdominal pain is minimal.  Having some diarrhea but thinks it is improving.  No chest pain or shortness of breath.  Did have some diaphoresis earlier with her nausea.  No other concerns or complaints at this time but then wanted to be discharged this afternoon. She was not medically stable to D/C and then subsequently signed out AMA.  Discharge Exam: Vitals:   03/16/21 0822 03/16/21 1344  BP: (!) 138/103 113/83  Pulse: 74 61  Resp:  18  Temp: 98.2 F (36.8 C) 98.6 F (37 C)  SpO2: 94% 99%   Vitals:   03/15/21 2353 03/16/21 0409 03/16/21 0822 03/16/21 1344  BP: (!) 158/114 113/68 (!) 138/103 113/83  Pulse: 60 64 74 61  Resp: 20 16  18   Temp: 97.9 F (36.6 C) (!) 97.1 F (36.2 C) 98.2 F (36.8 C) 98.6 F (37 C)  TempSrc:   Oral Oral  SpO2: 96% 93% 94% 99%  Weight:      Height:       Examination: Physical Exam:  Constitutional: WN/WD morbidly obese Caucasian female in NAD and appears calm but mildly uncomfortable Eyes: Lids and conjunctivae normal, sclerae anicteric  ENMT: External Ears, Nose appear normal. Grossly normal hearing. Neck: Appears normal, supple, no cervical masses, normal ROM, no appreciable thyromegaly; no JVD Respiratory: Diminished to auscultation bilaterally, no wheezing, rales, rhonchi or crackles. Normal respiratory effort and patient is not tachypenic. No accessory muscle use. Unlabored breathing  Cardiovascular: RRR, no murmurs / rubs / gallops. S1 and S2 auscultated. Minimal extremity edema Abdomen: Soft, minimally tender, Distended due to body habitus. Bowel sounds positive.  GU: Deferred. Musculoskeletal: No clubbing / cyanosis of digits/nails. No joint deformity upper and lower extremities.  Skin: No rashes, lesions, ulcers on a limited skin evaluation. No induration; Warm and dry.  Neurologic: CN 2-12 grossly  intact with no focal deficits. Romberg sign and cerebellar reflexes not assessed.  Psychiatric: Normal judgment and insight. Alert and oriented x 3. Normal mood and appropriate affect.   The results of significant diagnostics from this hospitalization (including imaging, microbiology, ancillary and laboratory) are listed below for reference.    Microbiology: Recent Results (from the past 240 hour(s))  SARS CORONAVIRUS 2 (TAT 6-24 HRS) Nasopharyngeal Nasopharyngeal Swab  Status: None   Collection Time: 03/15/21  3:40 PM   Specimen: Nasopharyngeal Swab  Result Value Ref Range Status   SARS Coronavirus 2 NEGATIVE NEGATIVE Final    Comment: (NOTE) SARS-CoV-2 target nucleic acids are NOT DETECTED.  The SARS-CoV-2 RNA is generally detectable in upper and lower respiratory specimens during the acute phase of infection. Negative results do not preclude SARS-CoV-2 infection, do not rule out co-infections with other pathogens, and should not be used as the sole basis for treatment or other patient management decisions. Negative results must be combined with clinical observations, patient history, and epidemiological information. The expected result is Negative.  Fact Sheet for Patients: SugarRoll.be  Fact Sheet for Healthcare Providers: https://www.woods-mathews.com/  This test is not yet approved or cleared by the [place] FDA and  has been authorized for detection and/or diagnosis of SARS-CoV-2 by FDA under an Emergency Use Authorization (EUA). This EUA will remain  in effect (meaning this test can be used) for the duration of the COVID-19 declaration under Se ction 564(b)(1) of the Act, 21 U.S.C. section 360bbb-3(b)(1), unless the authorization is terminated or revoked sooner.  Performed at [facility], [place] [address] [place],  [address]     Labs: BNP (last 3 results) No results for input(s): BNP in the last 8760  hours. Basic Metabolic Panel: Recent Labs  Lab 03/15/21 1125 03/16/21 0532 03/16/21 0538 03/16/21 1345  NA 124*  --  132* 132*  K 3.8  --  3.2* 3.3*  CL 89*  --  94* 98  CO2 20*  --  26 22  GLUCOSE 95  --  105* 126*  BUN 14  --  10 9  CREATININE 0.75  --  0.93 1.01*  CALCIUM 8.0*  --  7.9* 7.8*  MG  --  0.9*  --   --   PHOS  --  3.3  --   --    Liver Function Tests: Recent Labs  Lab 03/15/21 1125 03/16/21 1345  AST 49* 55*  ALT 43 38  ALKPHOS 95 92  BILITOT 0.9 1.2  PROT 7.0 6.9  ALBUMIN 4.2 4.1   Recent Labs  Lab 03/15/21 1125  LIPASE 33   No results for input(s): AMMONIA in the last 168 hours. CBC: Recent Labs  Lab 03/15/21 1125 03/16/21 0538  WBC 9.2 7.8  HGB 13.5 12.6  HCT 38.2 35.8*  MCV 91.2 94.7  PLT 292 195   Cardiac Enzymes: No results for input(s): CKTOTAL, CKMB, CKMBINDEX, TROPONINI in the last 168 hours. BNP: Invalid input(s): POCBNP CBG: Recent Labs  Lab 03/15/21 2035  GLUCAP 95   D-Dimer No results for input(s): DDIMER in the last 72 hours. Hgb A1c Recent Labs    03/16/21 0538  HGBA1C 5.5   Lipid Profile No results for input(s): CHOL, HDL, LDLCALC, TRIG, CHOLHDL, LDLDIRECT in the last 72 hours. Thyroid function studies No results for input(s): TSH, T4TOTAL, T3FREE, THYROIDAB in the last 72 hours.  Invalid input(s): FREET3 Anemia work up No results for input(s): VITAMINB12, FOLATE, FERRITIN, TIBC, IRON, RETICCTPCT in the last 72 hours. Urinalysis    Component Value Date/Time   COLORURINE YELLOW 03/15/2021 1128   APPEARANCEUR CLEAR 03/15/2021 1128   LABSPEC 1.014 03/15/2021 1128   PHURINE 5.0 03/15/2021 1128   GLUCOSEU NEGATIVE 03/15/2021 1128   HGBUR SMALL (A) 03/15/2021 1128   [place] 03/15/2021 [place] 03/15/2021 [place] 03/15/2021 1128   NITRITE NEGATIVE 03/15/2021  [place] 03/15/2021 1128   Sepsis Labs Invalid input(s): PROCALCITONIN,  WBC,   LACTICIDVEN Microbiology Recent Results (from the past 240 hour(s))  SARS CORONAVIRUS 2 (TAT 6-24 HRS) Nasopharyngeal Nasopharyngeal Swab     Status: None   Collection Time: 03/15/21  3:40 PM   Specimen: Nasopharyngeal Swab  Result Value Ref Range Status   SARS Coronavirus 2 NEGATIVE NEGATIVE Final    Comment: (NOTE) SARS-CoV-2 target nucleic acids are NOT DETECTED.  The SARS-CoV-2 RNA is generally detectable in upper and lower respiratory specimens during the acute phase of infection. Negative results do not preclude SARS-CoV-2 infection, do not rule out co-infections with other pathogens, and should not be used as the sole basis for treatment or other patient management decisions. Negative results must be combined with clinical observations, patient history, and epidemiological information. The expected result is Negative.  Fact Sheet for Patients: SugarRoll.be  Fact Sheet for Healthcare Providers: https://www.woods-mathews.com/  This test is not yet approved or cleared by the [place] FDA and  has been authorized for detection and/or diagnosis of SARS-CoV-2 by FDA under an Emergency Use Authorization (EUA). This EUA will remain  in effect (meaning this test can be used) for the duration of the COVID-19 declaration under Se ction 564(b)(1) of the Act, 21 U.S.C. section 360bbb-3(b)(1), unless the authorization is terminated or revoked sooner.  Performed at [facility], [place] [address] [place], [place] [address]    Time coordinating discharge: 35 minutes  SIGNED:  Kerney Elbe, DO [facility] 03/16/2021, 6:04 PM Pager is on [phone]  If 7PM-7AM, please contact night-coverage [email or web address]

## 2021-03-16 NOTE — Progress Notes (Signed)
PROGRESS NOTE    Renee [name]  WUJ:[id number] DOB: 1966/05/29 DOA: 03/15/2021 PCP: Lindell Spar, MD   Brief Narrative:  Patient is a 55 year old morbidly obese Caucasian female with a past medical history significant for but not limited to hypertension, depression and anxiety, as well as other comorbidities who presented to the ED with persistent vomiting since 3 AM.  She reports multiple episodes since her onset with no blood or black emesis noted.  She is also reports 5 loose stools which are getting better.  She states that she ate [employer] the day before and thinks that she may have gotten food poisoning.  She denies any black or bloody stools.  Denies any sick contacts and nobody else has the similar symptoms.  In the ED she was noted to have a stable WBC and creatinine of 0.75.  UA and abdominal imaging was unremarkable.  She is given Zofran x2 in the ED as well as Reglan.  Also noted to give a 1 L bolus in the ED.  She was admitted for intractable nausea vomiting and had severe electrolyte abnormalities and these are currently being repleted. Labs being rechecked this Afternoon   Assessment & Plan:   Principal Problem:   Intractable vomiting Active Problems:   Primary hypertension   Depression, major, single episode, mild (HCC)   Morbid obesity (HCC)   Hyponatremia  Intractable Nausea, Vomiting, and Associated Diarrhea likely in the setting of Viral Gastroenteritis, improving  -Improving -Patient was a little Nauseous this AM and Dry heaving -Continues to have no Leukocytosis and Abd Exam remains benign with mild tenderness -KUB and CXR done and showed "No acute abnormality in the chest and abdomen." -C/w IVF Hydration with D5 NS + 20 mEQ of KCl at 100 mL/hr for 20 Hours -Given a 1 Liter NS bolus yesterday and will give a 500 mL Bolus today -UDS unremarkable -Further Imaging deferred given that she is improving -C/w IV Pantoprazole 40 mg q24h -C/w Antiemetics p.o./IV Zofran  every 6 as needed for nausea as well as prochlorperazine 10 mg IV every 6 current for nausea vomiting and refractory nausea vomiting -Patient received a dose of metoclopramide 10 mg IV once in the ED -Initially put on Bowel rest but Diet advanced to a Soft Diet  -IF improved and electrolytes are replete and tolerating soft Diet can Discharge Home this evening   Hypertension -Holding home chlorthalidone given that it likely contributed to her electrolyte abnormalities -Continue with benazepril 40 mg p.o. daily, atenolol 50 mg daily as well as clonidine 0.1 mg p.o. daily -Continue to monitor blood pressures per protocol -Last BP reading was 138/103 -If Necessary will add IV Hydralazine 10 mg q6hprn for SBP>160 or DBP>100  Hypokalemia -Mild in the setting of Nausea and Vomiting and Diarrhea -K+ went from 3.8 -> 3.2 -Replete with D5 NS + 20 mEQ of KCl at 100 mL/hr and with IV KCl 40 mEQ but only had 1 run so will stop and replete with po KCl 40 mEQ BID x2 -Mag Level was low as below and will be replete -Continue to Monitor and Replete as Necessary -Repeat CMP in the AM   Depression and Anxiety -Continue with Bupropion 300 mg p.o. daily  Hyponatremia, improving  -Patient's Na+ was 124 and likely acute in the setting of her Intractable Nausea and Vomiting -Na+ was 138 on last check 3 months ago; repeat this AM was 132  -C/w IVF Hydration with D5 NS + 20 mEQ of KCl  at 100 mL/hr for 20 Hours -Given a 1 Liter NS bolus yesterday and will give a 500 mL Bolus today -Continue to Monitor and Trend and repeat CMP this Afternoon  Hypomagnesemia -Patient's magnesium level was 0.9 this morning -Replete with IV mag sulfate 4 g -Continue to monitor and replete as necessary -Repeat magnesium level in a.m.  Morbid Obesity -Complicates overall prognosis and care -Estimated body mass index is 43.27 kg/m as calculated from the following:   Height as of this encounter: 5\' 5"  (1.651 m).   Weight as of  this encounter: 117.9 kg. -Weight Loss and Dietary Counseling given   DVT prophylaxis: Enoxaparin 60 mg sq q24h Code Status: FULL CODE Family Communication: No family present at bedside  Disposition Plan: [facility] in the next 24-48 hours  Status is: Observation  The patient remains OBS appropriate and will d/c before 2 midnights.  Dispo: The patient is from: Home              Anticipated d/c is to: Home              Patient currently is not medically stable to d/c.   Difficult to place patient No  Consultants:   None  Procedures: None  Antimicrobials:  Anti-infectives (From admission, onward)   None        Subjective: Seen and examined this morning and she was a little bit nauseous and had some dry heaving "a lot better.  Abdominal pain is minimal.  Having some diarrhea but thinks it is improving.  No chest pain or shortness of breath.  Did have some diaphoresis earlier with her nausea.  No other concerns or complaints at this time.  Objective: Vitals:   03/15/21 2034 03/15/21 2353 03/16/21 0409 03/16/21 0822  BP: (!) 148/100 (!) 158/114 113/68 (!) 138/103  Pulse: 68 60 64 74  Resp: 18 20 16    Temp: 98.3 F (36.8 C) 97.9 F (36.6 C) (!) 97.1 F (36.2 C) 98.2 F (36.8 C)  TempSrc: Oral   Oral  SpO2: 98% 96% 93% 94%  Weight:      Height:        Intake/Output Summary (Last 24 hours) at 03/16/2021 1108 Last data filed at 03/16/2021 3016 Gross per 24 hour  Intake 2119.65 ml  Output --  Net 2119.65 ml   Filed Weights   03/15/21 1038  Weight: 117.9 kg   Examination: Physical Exam:  Constitutional: WN/WD morbidly obese Caucasian female in NAD and appears calm but mildly uncomfortable Eyes: Lids and conjunctivae normal, sclerae anicteric  ENMT: External Ears, Nose appear normal. Grossly normal hearing. Neck: Appears normal, supple, no cervical masses, normal ROM, no appreciable thyromegaly; no JVD Respiratory: Diminished to auscultation  bilaterally, no wheezing, rales, rhonchi or crackles. Normal respiratory effort and patient is not tachypenic. No accessory muscle use. Unlabored breathing  Cardiovascular: RRR, no murmurs / rubs / gallops. S1 and S2 auscultated. Minimal extremity edema Abdomen: Soft, minimally tender, Distended due to body habitus. Bowel sounds positive.  GU: Deferred. Musculoskeletal: No clubbing / cyanosis of digits/nails. No joint deformity upper and lower extremities.  Skin: No rashes, lesions, ulcers on a limited skin evaluation. No induration; Warm and dry.  Neurologic: CN 2-12 grossly intact with no focal deficits. Romberg sign and cerebellar reflexes not assessed.  Psychiatric: Normal judgment and insight. Alert and oriented x 3. Normal mood and appropriate affect.   Data Reviewed: I have personally reviewed following labs and imaging studies  CBC: Recent  Labs  Lab 03/15/21 1125 03/16/21 0538  WBC 9.2 7.8  HGB 13.5 12.6  HCT 38.2 35.8*  MCV 91.2 94.7  PLT 292 0000000   Basic Metabolic Panel: Recent Labs  Lab 03/15/21 1125 03/16/21 0532 03/16/21 0538  NA 124*  --  132*  K 3.8  --  3.2*  CL 89*  --  94*  CO2 20*  --  26  GLUCOSE 95  --  105*  BUN 14  --  10  CREATININE 0.75  --  0.93  CALCIUM 8.0*  --  7.9*  MG  --  0.9*  --   PHOS  --  3.3  --    GFR: Estimated Creatinine Clearance: 89.9 mL/min (by C-G formula based on SCr of 0.93 mg/dL). Liver Function Tests: Recent Labs  Lab 03/15/21 1125  AST 49*  ALT 43  ALKPHOS 95  BILITOT 0.9  PROT 7.0  ALBUMIN 4.2   Recent Labs  Lab 03/15/21 1125  LIPASE 33   No results for input(s): AMMONIA in the last 168 hours. Coagulation Profile: No results for input(s): INR, PROTIME in the last 168 hours. Cardiac Enzymes: No results for input(s): CKTOTAL, CKMB, CKMBINDEX, TROPONINI in the last 168 hours. BNP (last 3 results) No results for input(s): PROBNP in the last 8760 hours. HbA1C: No results for input(s): HGBA1C in the last 72  hours. CBG: Recent Labs  Lab 03/15/21 2035  GLUCAP 95   Lipid Profile: No results for input(s): CHOL, HDL, LDLCALC, TRIG, CHOLHDL, LDLDIRECT in the last 72 hours. Thyroid Function Tests: No results for input(s): TSH, T4TOTAL, FREET4, T3FREE, THYROIDAB in the last 72 hours. Anemia Panel: No results for input(s): VITAMINB12, FOLATE, FERRITIN, TIBC, IRON, RETICCTPCT in the last 72 hours. Sepsis Labs: No results for input(s): PROCALCITON, LATICACIDVEN in the last 168 hours.  Recent Results (from the past 240 hour(s))  SARS CORONAVIRUS 2 (TAT 6-24 HRS) Nasopharyngeal Nasopharyngeal Swab     Status: None   Collection Time: 03/15/21  3:40 PM   Specimen: Nasopharyngeal Swab  Result Value Ref Range Status   SARS Coronavirus 2 NEGATIVE NEGATIVE Final    Comment: (NOTE) SARS-CoV-2 target nucleic acids are NOT DETECTED.  The SARS-CoV-2 RNA is generally detectable in upper and lower respiratory specimens during the acute phase of infection. Negative results do not preclude SARS-CoV-2 infection, do not rule out co-infections with other pathogens, and should not be used as the sole basis for treatment or other patient management decisions. Negative results must be combined with clinical observations, patient history, and epidemiological information. The expected result is Negative.  Fact Sheet for Patients: SugarRoll.be  Fact Sheet for Healthcare Providers: https://www.woods-mathews.com/  This test is not yet approved or cleared by the [place] FDA and  has been authorized for detection and/or diagnosis of SARS-CoV-2 by FDA under an Emergency Use Authorization (EUA). This EUA will remain  in effect (meaning this test can be used) for the duration of the COVID-19 declaration under Se ction 564(b)(1) of the Act, 21 U.S.C. section 360bbb-3(b)(1), unless the authorization is terminated or revoked sooner.  Performed at [facility],  [place] [address], [place], [place] [address]      RN Pressure Injury Documentation:     Estimated body mass index is 43.27 kg/m as calculated from the following:   Height as of this encounter: 5\' 5"  (1.651 m).   Weight as of this encounter: 117.9 kg.  Malnutrition Type:   Malnutrition Characteristics:   Nutrition Interventions:  Radiology Studies: DG ABD ACUTE 2+V W 1V CHEST  Result Date: 03/15/2021 CLINICAL DATA:  Nausea and vomiting for several hours, initial encounter EXAM: DG ABDOMEN ACUTE WITH 1 VIEW CHEST COMPARISON:  None. FINDINGS: Cardiac shadows within normal limits. The lungs are clear bilaterally. Old rib fractures are noted on the right. Scattered large and small bowel gas is noted. No obstructive changes are seen. No free air is noted. No acute bony abnormality is seen. IMPRESSION: No acute abnormality in the chest and abdomen. Electronically Signed   By: Inez Catalina M.D.   On: 03/15/2021 15:09   Scheduled Meds: . atenolol  50 mg Oral Daily  . benazepril  40 mg Oral Daily  . buPROPion  300 mg Oral Daily  . cloNIDine  0.1 mg Oral Daily  . enoxaparin (LOVENOX) injection  60 mg Subcutaneous Q24H  . fluticasone  2 spray Each Nare Daily  . pantoprazole (PROTONIX) IV  40 mg Intravenous Q24H  . potassium chloride  40 mEq Oral BID   Continuous Infusions: . dextrose 5 % and 0.9 % NaCl with KCl 20 mEq/L Stopped (03/16/21 0520)  . magnesium sulfate bolus IVPB    . sodium chloride      LOS: 0 days   Kerney Elbe, DO [facility] PAGER is on [place]  If 7PM-7AM, please contact night-coverage [email or web address]

## 2021-03-17 ENCOUNTER — Telehealth: Payer: Self-pay

## 2021-03-17 NOTE — Telephone Encounter (Signed)
Transition Care Management Follow-up Telephone Call  Date of discharge and from where: 03/16/2021 from [facility]    How have you been since you were released from the hospital? Much better !   Any questions or concerns? Yes, pt would like to recheck renal function since dehydration.   Items Reviewed:  Did the pt receive and understand the discharge instructions provided? Yes   Medications obtained and verified? Yes   Other? No   Any new allergies since your discharge? No   Dietary orders reviewed? Yes  Do you have support at home? Yes   Home Care and Equipment/Supplies: Were home health services ordered? no If so, what is the name of the agency? NA  Has the agency set up a time to come to the patient's home? not applicable Were any new equipment or medical supplies ordered?  No What is the name of the medical supply agency? NA  Were you able to get the supplies/equipment? not applicable Do you have any questions related to the use of the equipment or supplies? No  Functional Questionnaire: (I = Independent and D = Dependent) ADLs: I  Bathing/Dressing- I  Meal Prep- I  Eating- I  Maintaining continence- I  Transferring/Ambulation- I  Managing Meds- I  Follow up appointments reviewed:   PCP Hospital f/u appt confirmed? Yes  Scheduled to see Dr. Posey Pronto on 03/23/21 @ 1:00pm.  [facility] f/u appt confirmed? No    Are transportation arrangements needed? No   If their condition worsens, is the pt aware to call PCP or go to the Emergency Dept.? Yes  Was the patient provided with contact information for the PCP's office or ED? Yes  Was to pt encouraged to call back with questions or concerns? Yes

## 2021-03-23 ENCOUNTER — Ambulatory Visit: Payer: 59 | Admitting: Internal Medicine

## 2021-03-28 ENCOUNTER — Telehealth: Payer: Self-pay

## 2021-03-28 NOTE — Telephone Encounter (Signed)
Pt scheduled  

## 2021-03-28 NOTE — Telephone Encounter (Signed)
Pt husband called and states she has tested + for Covid, pt has been taking otc cough medication without relief. Husband wants to know if you can call her anything to help with symptoms?

## 2021-03-28 NOTE — Telephone Encounter (Signed)
Please schedule virtual visit tomorrow and I will discuss it with her. Thank you.

## 2021-03-29 ENCOUNTER — Encounter: Payer: Self-pay | Admitting: Internal Medicine

## 2021-03-29 ENCOUNTER — Other Ambulatory Visit: Payer: Self-pay

## 2021-03-29 ENCOUNTER — Telehealth (INDEPENDENT_AMBULATORY_CARE_PROVIDER_SITE_OTHER): Payer: 59 | Admitting: Internal Medicine

## 2021-03-29 DIAGNOSIS — U071 COVID-19: Secondary | ICD-10-CM | POA: Diagnosis not present

## 2021-03-29 MED ORDER — NIRMATRELVIR/RITONAVIR (PAXLOVID)TABLET: 3.0000 | ORAL_TABLET | Freq: Two times a day (BID) | ORAL | 0 refills | Status: AC

## 2021-03-29 MED ORDER — PROMETHAZINE-DM 6.25-15 MG/5ML PO SYRP: 5.0000 mL | ORAL_SOLUTION | Freq: Four times a day (QID) | ORAL | 0 refills | Status: DC | PRN

## 2021-03-29 NOTE — Progress Notes (Signed)
Virtual Visit via Telephone Note   This visit type was conducted due to national recommendations for restrictions regarding the COVID-19 Pandemic (e.g. social distancing) in an effort to limit this patient's exposure and mitigate transmission in our community.  Due to her co-morbid illnesses, this patient is at least at moderate risk for complications without adequate follow up.  This format is felt to be most appropriate for this patient at this time.  The patient did not have access to video technology/had technical difficulties with video requiring transitioning to audio format only (telephone).  All issues noted in this document were discussed and addressed.  No physical exam could be performed with this format.  Evaluation Performed:  Follow-up visit  Date:  03/29/2021   ID:  [name], DOB 1966-04-12, MRN [id number]  Patient Location: Home Provider Location: Office/Clinic  Participants: Patient Location of Patient: Home Location of Provider: Telehealth Consent was obtain for visit to be over via telehealth. I verified that I am speaking with the correct person using two identifiers.  PCP:  Lindell Spar, MD   Chief Complaint:  Cough and congestion  History of Present Illness:    Renee [name] is a 55 y.o. female who has a televisit for c/o cough, nasal congestion, headache, fatigue and mild dyspnea upon exertion for last 2 days. She had positive COVID test 2 days ago. Denies any dyspnea at rest or wheezing. Her mother also tested positive for COVID.  The patient does have symptoms concerning for COVID-19 infection (fever, chills, cough, or new shortness of breath).   Past Medical, Surgical, Social History, Allergies, and Medications have been Reviewed.  Past Medical History:  Diagnosis Date  . Depression    Phreesia 10/17/2020  . Hypertension    Phreesia 10/17/2020   Past Surgical History:  Procedure Laterality Date  . ABDOMINAL HYSTERECTOMY N/A    Phreesia  10/17/2020  . COLONOSCOPY N/A 01/26/2021   Procedure: COLONOSCOPY;  Surgeon: Rogene Houston, MD;  Location: [facility];  Service: Endoscopy;  Laterality: N/A;  AM  . SMALL INTESTINE SURGERY N/A    Phreesia 10/17/2020  . TONSILLECTOMY       Current Meds  Medication Sig  . acetaminophen (TYLENOL) 500 MG tablet Take 1,000 mg by mouth every 6 (six) hours as needed for mild pain or headache.  Marland Kitchen atenolol (TENORMIN) 50 MG tablet Take 1 tablet (50 mg total) by mouth daily.  . benazepril (LOTENSIN) 40 MG tablet Take 1 tablet (40 mg total) by mouth daily. Take 1 tablet at bedtime  . buPROPion (WELLBUTRIN XL) 300 MG 24 hr tablet Take 1 tablet by mouth once daily  . chlorthalidone (HYGROTON) 25 MG tablet Take 1 tablet (25 mg total) by mouth daily.  . cloNIDine (CATAPRES) 0.1 MG tablet Take 1 tablet (0.1 mg total) by mouth daily. Take 1 tablet daily  . esomeprazole (NEXIUM) 20 MG capsule Take 20 mg by mouth daily at 12 noon. Take 1 daily  . fluticasone (FLONASE) 50 MCG/ACT nasal spray Place 2 sprays into both nostrils daily.  Marland Kitchen ibuprofen (ADVIL) 200 MG tablet Take 800 mg by mouth every 6 (six) hours as needed for mild pain or moderate pain (Back pain).  . melatonin 5 MG TABS Take 5 mg by mouth once a week.  . Multiple Vitamin (MULTIVITAMIN) tablet Take 1 tablet by mouth daily.  . nirmatrelvir/ritonavir EUA (PAXLOVID) TABS Take 3 tablets by mouth 2 (two) times daily for 5 days. Patient GFR  is >60.Take nirmatrelvir (150 mg) two tablets twice daily for 5 days and ritonavir (100 mg) one tablet twice daily for 5 days.  . promethazine-dextromethorphan (PROMETHAZINE-DM) 6.25-15 MG/5ML syrup Take 5 mLs by mouth every 6 (six) hours as needed for cough.  . rosuvastatin (CRESTOR) 5 MG tablet Take 1 tablet (5 mg total) by mouth daily.  . traZODone (DESYREL) 50 MG tablet Take 0.5-1 tablets (25-50 mg total) by mouth at bedtime as needed for sleep.     Allergies:   Patient has no known allergies.   ROS:    Please see the history of present illness.     All other systems reviewed and are negative.   Labs/Other Tests and Data Reviewed:    Recent Labs: 12/15/2020: TSH 3.660 03/16/2021: ALT 38; BUN 9; Creatinine, Ser 1.01; Hemoglobin 12.6; Magnesium 0.9; Platelets 195; Potassium 3.3; Sodium 132   Recent Lipid Panel No results found for: CHOL, TRIG, HDL, CHOLHDL, LDLCALC, LDLDIRECT  Wt Readings from Last 3 Encounters:  03/15/21 260 lb (117.9 kg)  02/16/21 261 lb (118.4 kg)  01/26/21 260 lb (117.9 kg)      ASSESSMENT & PLAN:    COVID-19 infection Considering chronic medical conditions, will start Paxlovid Promethazine-DM syrup for cough Mucinex PRN for cough and congestion Self-quarantine for 7 days from symptom onset or 24-hour afebrile period, whichever is later  Time:   Today, I have spent 7 minutes reviewing the chart, including problem list, medications, and with the patient with telehealth technology discussing the above problems.   Medication Adjustments/Labs and Tests Ordered: Current medicines are reviewed at length with the patient today.  Concerns regarding medicines are outlined above.   Tests Ordered: No orders of the defined types were placed in this encounter.   Medication Changes: Meds ordered this encounter  Medications  . nirmatrelvir/ritonavir EUA (PAXLOVID) TABS    Sig: Take 3 tablets by mouth 2 (two) times daily for 5 days. Patient GFR is >60.Take nirmatrelvir (150 mg) two tablets twice daily for 5 days and ritonavir (100 mg) one tablet twice daily for 5 days.    Dispense:  30 tablet    Refill:  0  . promethazine-dextromethorphan (PROMETHAZINE-DM) 6.25-15 MG/5ML syrup    Sig: Take 5 mLs by mouth every 6 (six) hours as needed for cough.    Dispense:  118 mL    Refill:  0     Note: This dictation was prepared with Dragon dictation along with smaller phrase technology. Similar sounding words can be transcribed inadequately or may not be corrected upon  review. Any transcriptional errors that result from this process are unintentional.      Disposition:  Follow up  Signed, Lindell Spar, MD  03/29/2021 11:54 AM     [facility]

## 2021-03-29 NOTE — Patient Instructions (Signed)
Please start taking Paxlovid as prescribed.  Please make sure to stay hydrated by taking at least 64 ounces of fluid in a day.  Okay to take Mucinex as needed for cough and congestion.  Please continue to self-quarantine for at least 7 days from symptom onset or at least 24-hour afebrile period. 

## 2021-03-30 ENCOUNTER — Encounter (INDEPENDENT_AMBULATORY_CARE_PROVIDER_SITE_OTHER): Payer: Self-pay | Admitting: *Deleted

## 2021-03-31 ENCOUNTER — Other Ambulatory Visit: Payer: Self-pay | Admitting: Internal Medicine

## 2021-04-24 ENCOUNTER — Ambulatory Visit: Payer: 59 | Admitting: Internal Medicine

## 2021-05-02 ENCOUNTER — Other Ambulatory Visit: Payer: Self-pay | Admitting: Internal Medicine

## 2021-06-02 ENCOUNTER — Other Ambulatory Visit: Payer: Self-pay | Admitting: Internal Medicine

## 2021-06-22 ENCOUNTER — Encounter: Payer: Self-pay | Admitting: Internal Medicine

## 2021-06-22 ENCOUNTER — Other Ambulatory Visit: Payer: Self-pay

## 2021-06-22 ENCOUNTER — Ambulatory Visit (INDEPENDENT_AMBULATORY_CARE_PROVIDER_SITE_OTHER): Payer: 59 | Admitting: Internal Medicine

## 2021-06-22 VITALS — BP 120/80 | HR 75 | Temp 98.4°F | Resp 18 | Ht 66.0 in | Wt 256.4 lb

## 2021-06-22 DIAGNOSIS — G4700 Insomnia, unspecified: Secondary | ICD-10-CM

## 2021-06-22 DIAGNOSIS — F321 Major depressive disorder, single episode, moderate: Secondary | ICD-10-CM

## 2021-06-22 MED ORDER — PAROXETINE HCL 20 MG PO TABS: 20.0000 mg | ORAL_TABLET | Freq: Every day | ORAL | 2 refills | Status: DC

## 2021-06-22 MED ORDER — TRAZODONE HCL 100 MG PO TABS: 100.0000 mg | ORAL_TABLET | Freq: Every evening | ORAL | 5 refills | Status: DC | PRN

## 2021-06-22 NOTE — Assessment & Plan Note (Signed)
PHQ9 SCORE ONLY 06/22/2021 03/29/2021 02/16/2021  PHQ-9 Total Score 8 0 6   Taper Wellbutrin Switch to Paroxetine Trazodone 100 mg qHS PRN for insomnia Advised to take Vitamin B12

## 2021-06-22 NOTE — Patient Instructions (Signed)
Please start taking Paroxetine instead of Wellbutrin as instructed.  Please continue taking Trazodone.  Engage in activities of liking.  Please start taking Vitamin B12 1000 mcg once daily.

## 2021-06-22 NOTE — Progress Notes (Signed)
Acute Office Visit  Subjective:    Patient ID: DEMIANA AO, female    DOB: 10/29/1966, 55 y.o.   MRN: [id number]  Chief Complaint  Patient presents with   Follow-up    Feels like she is depressed she is isolating herself no energy and crys a lot this has been going on for about 5 months     HPI Patient is in today for evaluation of depressed mood and fatigue for last 5 months. She has lost interest in sewing, which she liked in the past. She is also stressed about her mother - patient is her caregiver and is overwhelmed at times. She has crying spells. Denies any SI or HI. She takes Wellbutrin, which was started for smoking cessation. She has been taking Trazodone 100 mg from her mother's prescription instead of 50 mg.  Past Medical History:  Diagnosis Date   Depression    Phreesia 10/17/2020   Hypertension    Phreesia 10/17/2020    Past Surgical History:  Procedure Laterality Date   ABDOMINAL HYSTERECTOMY N/A    Phreesia 10/17/2020   COLONOSCOPY N/A 01/26/2021   Procedure: COLONOSCOPY;  Surgeon: Rogene Houston, MD;  Location: [facility];  Service: Endoscopy;  Laterality: N/A;  AM   SMALL INTESTINE SURGERY N/A    Phreesia 10/17/2020   TONSILLECTOMY      History reviewed. No pertinent family history.  Social History   Socioeconomic History   Marital status: Married    Spouse name: Not on file   Number of children: Not on file   Years of education: Not on file   Highest education level: Not on file  Occupational History   Not on file  Tobacco Use   Smoking status: Former    Types: Cigarettes    Quit date: 2018    Years since quitting: 4.6   Smokeless tobacco: Never   Tobacco comments:    35 pack-year  Vaping Use   Vaping Use: Never used  Substance and Sexual Activity   Alcohol use: Yes    Alcohol/week: 4.0 standard drinks    Types: 4 Glasses of wine per week   Drug use: Never   Sexual activity: Yes  Other Topics Concern   Not on file  Social  History Narrative   Not on file   Social Determinants of Health   Financial Resource Strain: Not on file  Food Insecurity: Not on file  Transportation Needs: Not on file  Physical Activity: Not on file  Stress: Not on file  Social Connections: Not on file  Intimate Partner Violence: Not on file    Outpatient Medications Prior to Visit  Medication Sig Dispense Refill   acetaminophen (TYLENOL) 500 MG tablet Take 1,000 mg by mouth every 6 (six) hours as needed for mild pain or headache.     atenolol (TENORMIN) 50 MG tablet Take 1 tablet (50 mg total) by mouth daily. 90 tablet 1   benazepril (LOTENSIN) 40 MG tablet Take 1 tablet (40 mg total) by mouth daily. Take 1 tablet at bedtime 90 tablet 1   chlorthalidone (HYGROTON) 25 MG tablet Take 1 tablet (25 mg total) by mouth daily. 90 tablet 1   cloNIDine (CATAPRES) 0.1 MG tablet Take 1 tablet (0.1 mg total) by mouth daily. Take 1 tablet daily 90 tablet 1   esomeprazole (NEXIUM) 20 MG capsule Take 20 mg by mouth daily at 12 noon. Take 1 daily     fluticasone (FLONASE) 50 MCG/ACT nasal spray  Place 2 sprays into both nostrils daily.     ibuprofen (ADVIL) 200 MG tablet Take 800 mg by mouth every 6 (six) hours as needed for mild pain or moderate pain (Back pain).     melatonin 5 MG TABS Take 5 mg by mouth once a week.     Multiple Vitamin (MULTIVITAMIN) tablet Take 1 tablet by mouth daily.     promethazine-dextromethorphan (PROMETHAZINE-DM) 6.25-15 MG/5ML syrup Take 5 mLs by mouth every 6 (six) hours as needed for cough. 118 mL 0   rosuvastatin (CRESTOR) 5 MG tablet Take 1 tablet (5 mg total) by mouth daily. 90 tablet 3   buPROPion (WELLBUTRIN XL) 300 MG 24 hr tablet Take 1 tablet by mouth once daily 30 tablet 0   traZODone (DESYREL) 50 MG tablet Take 0.5-1 tablets (25-50 mg total) by mouth at bedtime as needed for sleep. 30 tablet 3   No facility-administered medications prior to visit.    No Known Allergies  Review of Systems   Constitutional:  Positive for fatigue. Negative for chills and fever.  HENT:  Negative for congestion, sinus pressure, sinus pain and sore throat.   Eyes:  Negative for pain and discharge.  Respiratory:  Negative for cough and shortness of breath.   Cardiovascular:  Negative for chest pain and palpitations.  Gastrointestinal:  Negative for abdominal pain, constipation, diarrhea, nausea and vomiting.  Endocrine: Negative for polydipsia and polyuria.  Genitourinary:  Negative for dysuria and hematuria.  Musculoskeletal:  Negative for neck pain and neck stiffness.  Skin:  Negative for rash.  Neurological:  Negative for dizziness and weakness.  Psychiatric/Behavioral:  Positive for dysphoric mood and sleep disturbance. Negative for agitation and behavioral problems.       Objective:    Physical Exam Vitals reviewed.  Constitutional:      General: She is not in acute distress.    Appearance: She is obese. She is not diaphoretic.  HENT:     Head: Normocephalic and atraumatic.     Nose: Nose normal.     Mouth/Throat:     Mouth: Mucous membranes are moist.  Eyes:     General: No scleral icterus.    Extraocular Movements: Extraocular movements intact.     Pupils: Pupils are equal, round, and reactive to light.  Cardiovascular:     Rate and Rhythm: Normal rate and regular rhythm.     Pulses: Normal pulses.     Heart sounds: Normal heart sounds. No murmur heard. Pulmonary:     Breath sounds: Normal breath sounds. No wheezing or rales.  Abdominal:     Palpations: Abdomen is soft.     Tenderness: There is no abdominal tenderness.  Musculoskeletal:     Cervical back: Neck supple. No tenderness.     Right lower leg: No edema.     Left lower leg: No edema.  Skin:    General: Skin is warm.     Findings: No rash.  Neurological:     General: No focal deficit present.     Mental Status: She is alert and oriented to person, place, and time.     Sensory: No sensory deficit.     Motor:  No weakness.  Psychiatric:        Mood and Affect: Mood is depressed.        Behavior: Behavior normal.    BP 120/80 (BP Location: Left Arm, Patient Position: Sitting, Cuff Size: Normal)   Pulse 75   Temp 98.4 F (36.9 C) (  Oral)   Resp 18   Ht '5\' 6"'$  (1.676 m)   Wt 256 lb 6.4 oz (116.3 kg)   SpO2 95%   BMI 41.38 kg/m  Wt Readings from Last 3 Encounters:  06/22/21 256 lb 6.4 oz (116.3 kg)  03/15/21 260 lb (117.9 kg)  02/16/21 261 lb (118.4 kg)    Health Maintenance Due  Topic Date Due   COVID-19 Vaccine (2 - Moderna series) 05/18/2020   INFLUENZA VACCINE  06/12/2021    There are no preventive care reminders to display for this patient.   Lab Results  Component Value Date   TSH 3.660 12/15/2020   Lab Results  Component Value Date   WBC 7.8 03/16/2021   HGB 12.6 03/16/2021   HCT 35.8 (L) 03/16/2021   MCV 94.7 03/16/2021   PLT 195 03/16/2021   Lab Results  Component Value Date   NA 132 (L) 03/16/2021   K 3.3 (L) 03/16/2021   CO2 22 03/16/2021   GLUCOSE 126 (H) 03/16/2021   BUN 9 03/16/2021   CREATININE 1.01 (H) 03/16/2021   BILITOT 1.2 03/16/2021   ALKPHOS 92 03/16/2021   AST 55 (H) 03/16/2021   ALT 38 03/16/2021   PROT 6.9 03/16/2021   ALBUMIN 4.1 03/16/2021   CALCIUM 7.8 (L) 03/16/2021   ANIONGAP 12 03/16/2021   No results found for: CHOL No results found for: HDL No results found for: LDLCALC No results found for: TRIG No results found for: CHOLHDL Lab Results  Component Value Date   HGBA1C 5.5 03/16/2021       Assessment & Plan:   Problem List Items Addressed This Visit       Other   Depression, major, single episode, moderate ([place]) - Primary    PHQ9 SCORE ONLY 06/22/2021 03/29/2021 02/16/2021  PHQ-9 Total Score 8 0 6  Taper Wellbutrin Switch to Paroxetine Trazodone 100 mg qHS PRN for insomnia Advised to take Vitamin B12      Relevant Medications   PARoxetine (PAXIL) 20 MG tablet   traZODone (DESYREL) 100 MG tablet   Insomnia    Relevant Medications   traZODone (DESYREL) 100 MG tablet     Meds ordered this encounter  Medications   PARoxetine (PAXIL) 20 MG tablet    Sig: Take 1 tablet (20 mg total) by mouth daily.    Dispense:  30 tablet    Refill:  2   traZODone (DESYREL) 100 MG tablet    Sig: Take 1 tablet (100 mg total) by mouth at bedtime as needed for sleep.    Dispense:  30 tablet    Refill:  5     Briahnna Harries Keith Rake, MD

## 2021-08-01 ENCOUNTER — Other Ambulatory Visit (INDEPENDENT_AMBULATORY_CARE_PROVIDER_SITE_OTHER): Payer: Self-pay

## 2021-08-01 ENCOUNTER — Telehealth (INDEPENDENT_AMBULATORY_CARE_PROVIDER_SITE_OTHER): Payer: Self-pay

## 2021-08-01 ENCOUNTER — Encounter (INDEPENDENT_AMBULATORY_CARE_PROVIDER_SITE_OTHER): Payer: Self-pay

## 2021-08-01 DIAGNOSIS — Z1211 Encounter for screening for malignant neoplasm of colon: Secondary | ICD-10-CM

## 2021-08-01 MED ORDER — PEG 3350-KCL-NA BICARB-NACL 420 G PO SOLR: 4000.0000 mL | ORAL | 0 refills | Status: DC

## 2021-08-01 NOTE — Telephone Encounter (Signed)
LeighAnn Emari Demmer, CMA  

## 2021-08-01 NOTE — Telephone Encounter (Signed)
Referring MD/PCP: Posey Pronto  Procedure: Tcs  Reason/Indication:  Screening  Has patient had this procedure before?  yes  If so, when, by whom and where?  01/2021 (Repeat Tcs due to poor prep)  Is there a family history of colon cancer?  no  Who?  What age when diagnosed?    Is patient diabetic? If yes, Type 1 or Type 2   no      Does patient have prosthetic heart valve or mechanical valve?  no  Do you have a pacemaker/defibrillator?  no  Has patient ever had endocarditis/atrial fibrillation? no  Does patient use oxygen? no  Has patient had joint replacement within last 12 months?  no  Is patient constipated or do they take laxatives? no  Does patient have a history of alcohol/drug use?  no  Have you had a stroke/heart attack last 6 mths? no  Do you take medicine for weight loss?  no  For female patients,: do you still have your menstrual cycle? no  Is patient on blood thinner such as Coumadin, Plavix and/or Aspirin? no  Medications: bupropion 300 mg daily, atenolol 50 mg daily, chlorthalidone 25 mg daily, clonidine 0.1 mg daily, benazepril 40 mg daily  Allergies: nkda  Medication Adjustment per Dr Laural Golden  none  Procedure date & time: 08/17/21 at 10:45

## 2021-08-09 ENCOUNTER — Other Ambulatory Visit: Payer: Self-pay

## 2021-08-09 ENCOUNTER — Emergency Department (HOSPITAL_COMMUNITY)
Admission: EM | Admit: 2021-08-09 | Discharge: 2021-08-09 | Disposition: A | Discharge status: Home / Self Care | Payer: 59 | Source: Home / Self Care | Attending: Emergency Medicine | Admitting: Emergency Medicine

## 2021-08-09 ENCOUNTER — Encounter (HOSPITAL_COMMUNITY): Payer: Self-pay | Admitting: Radiology

## 2021-08-09 ENCOUNTER — Emergency Department (HOSPITAL_COMMUNITY): Payer: 59

## 2021-08-09 DIAGNOSIS — D72829 Elevated white blood cell count, unspecified: Secondary | ICD-10-CM | POA: Insufficient documentation

## 2021-08-09 DIAGNOSIS — F10231 Alcohol dependence with withdrawal delirium: Secondary | ICD-10-CM | POA: Diagnosis not present

## 2021-08-09 DIAGNOSIS — F419 Anxiety disorder, unspecified: Secondary | ICD-10-CM

## 2021-08-09 DIAGNOSIS — R0602 Shortness of breath: Secondary | ICD-10-CM

## 2021-08-09 DIAGNOSIS — F1010 Alcohol abuse, uncomplicated: Secondary | ICD-10-CM

## 2021-08-09 DIAGNOSIS — Z79899 Other long term (current) drug therapy: Secondary | ICD-10-CM | POA: Insufficient documentation

## 2021-08-09 DIAGNOSIS — Z87891 Personal history of nicotine dependence: Secondary | ICD-10-CM | POA: Insufficient documentation

## 2021-08-09 DIAGNOSIS — R079 Chest pain, unspecified: Secondary | ICD-10-CM | POA: Insufficient documentation

## 2021-08-09 DIAGNOSIS — R197 Diarrhea, unspecified: Secondary | ICD-10-CM | POA: Diagnosis not present

## 2021-08-09 DIAGNOSIS — Y908 Blood alcohol level of 240 mg/100 ml or more: Secondary | ICD-10-CM | POA: Insufficient documentation

## 2021-08-09 DIAGNOSIS — R0789 Other chest pain: Secondary | ICD-10-CM

## 2021-08-09 DIAGNOSIS — I10 Essential (primary) hypertension: Secondary | ICD-10-CM | POA: Insufficient documentation

## 2021-08-09 LAB — CBC WITH DIFFERENTIAL/PLATELET
Abs Immature Granulocytes: 0.05 10*3/uL (ref 0.00–0.07)
Basophils Absolute: 0.1 10*3/uL (ref 0.0–0.1)
Basophils Relative: 1 %
Eosinophils Absolute: 0.1 10*3/uL (ref 0.0–0.5)
Eosinophils Relative: 1 %
HCT: 39.5 % (ref 36.0–46.0)
Hemoglobin: 13.6 g/dL (ref 12.0–15.0)
Immature Granulocytes: 0 %
Lymphocytes Relative: 35 %
Lymphs Abs: 3.9 10*3/uL (ref 0.7–4.0)
MCH: 33.8 pg (ref 26.0–34.0)
MCHC: 34.4 g/dL (ref 30.0–36.0)
MCV: 98.3 fL (ref 80.0–100.0)
Monocytes Absolute: 0.5 10*3/uL (ref 0.1–1.0)
Monocytes Relative: 4 %
Neutro Abs: 6.5 10*3/uL (ref 1.7–7.7)
Neutrophils Relative %: 59 %
Platelets: 347 10*3/uL (ref 150–400)
RBC: 4.02 MIL/uL (ref 3.87–5.11)
RDW: 12.4 % (ref 11.5–15.5)
WBC: 11.1 10*3/uL — ABNORMAL HIGH (ref 4.0–10.5)
nRBC: 0 % (ref 0.0–0.2)

## 2021-08-09 LAB — COMPREHENSIVE METABOLIC PANEL
ALT: 34 U/L (ref 0–44)
AST: 53 U/L — ABNORMAL HIGH (ref 15–41)
Albumin: 4.2 g/dL (ref 3.5–5.0)
Alkaline Phosphatase: 114 U/L (ref 38–126)
Anion gap: 13 (ref 5–15)
BUN: 14 mg/dL (ref 6–20)
CO2: 19 mmol/L — ABNORMAL LOW (ref 22–32)
Calcium: 8.2 mg/dL — ABNORMAL LOW (ref 8.9–10.3)
Chloride: 97 mmol/L — ABNORMAL LOW (ref 98–111)
Creatinine, Ser: 0.77 mg/dL (ref 0.44–1.00)
GFR, Estimated: >60 mL/min (ref >60)
Glucose, Bld: 94 mg/dL (ref 70–99)
Potassium: 4.1 mmol/L (ref 3.5–5.1)
Sodium: 129 mmol/L — ABNORMAL LOW (ref 135–145)
Total Bilirubin: 0.4 mg/dL (ref 0.3–1.2)
Total Protein: 7.4 g/dL (ref 6.5–8.1)

## 2021-08-09 LAB — TROPONIN I (HIGH SENSITIVITY)
Troponin I (High Sensitivity): 3 ng/L (ref <18)
Troponin I (High Sensitivity): 2 ng/L (ref <18)

## 2021-08-09 LAB — ETHANOL: Alcohol, Ethyl (B): 334 mg/dL (ref <10)

## 2021-08-09 MED ORDER — ONDANSETRON HCL 4 MG/2ML IJ SOLN
4.0000 mg | Freq: Once | INTRAMUSCULAR | Status: AC
  Administered 2021-08-09: 4 mg via INTRAVENOUS
  Filled 2021-08-09: qty 2

## 2021-08-09 MED ORDER — SODIUM CHLORIDE 0.9 % IV BOLUS
500.0000 mL | Freq: Once | INTRAVENOUS | Status: AC
Start: 2021-08-09 — End: 2021-08-09
  Administered 2021-08-09: 500 mL via INTRAVENOUS

## 2021-08-09 NOTE — ED Provider Notes (Signed)
Patient received in sign-out from R. Sponseller, PA-C. History of alcohol abuse. Preparing for rehab. Presented with left sided chest pain, shortness of breath, and anxiety.  Results for orders placed or performed during the hospital encounter of 08/09/21  Comprehensive metabolic panel  Result Value Ref Range   Sodium 129 (L) 135 - 145 mmol/L   Potassium 4.1 3.5 - 5.1 mmol/L   Chloride 97 (L) 98 - 111 mmol/L   CO2 19 (L) 22 - 32 mmol/L   Glucose, Bld 94 70 - 99 mg/dL   BUN 14 6 - 20 mg/dL   Creatinine, Ser 0.77 0.44 - 1.00 mg/dL   Calcium 8.2 (L) 8.9 - 10.3 mg/dL   Total Protein 7.4 6.5 - 8.1 g/dL   Albumin 4.2 3.5 - 5.0 g/dL   AST 53 (H) 15 - 41 U/L   ALT 34 0 - 44 U/L   Alkaline Phosphatase 114 38 - 126 U/L   Total Bilirubin 0.4 0.3 - 1.2 mg/dL   GFR, Estimated >60 >60 mL/min   Anion gap 13 5 - 15  Ethanol  Result Value Ref Range   Alcohol, Ethyl (B) 334 (HH) <10 mg/dL  CBC with Differential  Result Value Ref Range   WBC 11.1 (H) 4.0 - 10.5 K/uL   RBC 4.02 3.87 - 5.11 MIL/uL   Hemoglobin 13.6 12.0 - 15.0 g/dL   HCT 39.5 36.0 - 46.0 %   MCV 98.3 80.0 - 100.0 fL   MCH 33.8 26.0 - 34.0 pg   MCHC 34.4 30.0 - 36.0 g/dL   RDW 12.4 11.5 - 15.5 %   Platelets 347 150 - 400 K/uL   nRBC 0.0 0.0 - 0.2 %   Neutrophils Relative % 59 %   Neutro Abs 6.5 1.7 - 7.7 K/uL   Lymphocytes Relative 35 %   Lymphs Abs 3.9 0.7 - 4.0 K/uL   Monocytes Relative 4 %   Monocytes Absolute 0.5 0.1 - 1.0 K/uL   Eosinophils Relative 1 %   Eosinophils Absolute 0.1 0.0 - 0.5 K/uL   Basophils Relative 1 %   Basophils Absolute 0.1 0.0 - 0.1 K/uL   Immature Granulocytes 0 %   Abs Immature Granulocytes 0.05 0.00 - 0.07 K/uL  Troponin I (High Sensitivity)  Result Value Ref Range   Troponin I (High Sensitivity) 3 <18 ng/L  Troponin I (High Sensitivity)  Result Value Ref Range   Troponin I (High Sensitivity) 2 <18 ng/L   DG Chest Port 1 View  Result Date: 08/09/2021 CLINICAL DATA:  Chest pain.   Shortness of breath. EXAM: PORTABLE CHEST 1 VIEW COMPARISON:  CT chest 11/18/2020 FINDINGS: The heart and mediastinal contours are within normal limits. No focal consolidation. No pulmonary edema. No pleural effusion. No pneumothorax. No acute osseous abnormality. IMPRESSION: No active disease. Electronically Signed   By: Iven Finn M.D.   On: 08/09/2021 18:31     Results reviewed and shared with patient and husband.  Patient is cleared to seek rehab treatment.  Patient is to be discharged with recommendation to follow up with PCP in regards to today's hospital visit. Chest pain is not likely of cardiac or pulmonary etiology d/t presentation, perc negative, VSS, no tracheal deviation, no JVD or new murmur, RRR, breath sounds equal bilaterally, EKG without acute abnormalities, negative troponin, and negative CXR. Pt has been advised to return to the ED is CP becomes exertional, associated with diaphoresis or nausea, radiates to left jaw/arm, worsens or becomes concerning in any way.  Pt appears reliable for follow up and is agreeable to discharge.   Case has been discussed with Dr. Karle Starch who agrees with the above plan to discharge.     Etta Quill, NP 08/09/21 2328    Truddie Hidden, MD 08/11/21 0900

## 2021-08-09 NOTE — ED Provider Notes (Signed)
[facility] EMERGENCY DEPARTMENT Provider Note   CSN: [id number] Arrival date & time: 08/09/21  1656     History Chief Complaint  Patient presents with   Shortness of Breath    Renee [name] is a 55 y.o. female who presents with her husband at the bedside with concern for left-sided chest pain that is intermittent today, not exacerbated with exertion.  Some associated shortness of breath and anxiety but no nausea or vomiting.   Much of the history is provided by the patient's husband as she is extremely anxious.  Both the patient and her husband endorse that she is an alcoholic and they have scheduled for her to be admitted to a rehab facility, however want her to be evaluated for her recurrent chest pain prior to this admission.  I have personally reviewed this patient's medical records.  She has history of alcohol abuse, depression, hypertension, and hyponatremia.  She is on paroxetine.  HPI     Past Medical History:  Diagnosis Date   Depression    Phreesia 10/17/2020   Hypertension    Phreesia 10/17/2020    Patient Active Problem List   Diagnosis Date Noted   Intractable vomiting 03/15/2021   Hyponatremia 03/15/2021   Aortic atherosclerosis ([place]) 02/16/2021   Insomnia 02/16/2021   Primary hypertension 10/18/2020   Depression, major, single episode, moderate ([place]) 10/18/2020   Tobacco abuse 10/18/2020   Morbid obesity ([place]) 10/18/2020   GERD (gastroesophageal reflux disease) 10/18/2020    Past Surgical History:  Procedure Laterality Date   ABDOMINAL HYSTERECTOMY N/A    Phreesia 10/17/2020   COLONOSCOPY N/A 01/26/2021   Procedure: COLONOSCOPY;  Surgeon: Rogene Houston, MD;  Location: [facility];  Service: Endoscopy;  Laterality: N/A;  AM   SMALL INTESTINE SURGERY N/A    Phreesia 10/17/2020   TONSILLECTOMY       OB History   No obstetric history on file.     History reviewed. No pertinent family history.  Social History   Tobacco Use   Smoking  status: Former    Types: Cigarettes    Quit date: 2018    Years since quitting: 4.7   Smokeless tobacco: Never   Tobacco comments:    35 pack-year  Vaping Use   Vaping Use: Never used  Substance Use Topics   Alcohol use: Yes    Alcohol/week: 4.0 standard drinks    Types: 4 Glasses of wine per week   Drug use: Never    Home Medications Prior to Admission medications   Medication Sig Start Date End Date Taking? Authorizing Provider  acetaminophen (TYLENOL) 500 MG tablet Take 1,000 mg by mouth every 6 (six) hours as needed for mild pain or headache.    [provider]  atenolol (TENORMIN) 50 MG tablet Take 1 tablet (50 mg total) by mouth daily. 02/16/21   Lindell Spar, MD  benazepril (LOTENSIN) 40 MG tablet Take 1 tablet (40 mg total) by mouth daily. Take 1 tablet at bedtime 02/16/21   Lindell Spar, MD  chlorthalidone (HYGROTON) 25 MG tablet Take 1 tablet (25 mg total) by mouth daily. 02/16/21   Lindell Spar, MD  cloNIDine (CATAPRES) 0.1 MG tablet Take 1 tablet (0.1 mg total) by mouth daily. Take 1 tablet daily 02/16/21   Lindell Spar, MD  esomeprazole (NEXIUM) 20 MG capsule Take 20 mg by mouth daily at 12 noon. Take 1 daily    [provider]  fluticasone (FLONASE) 50 MCG/ACT nasal  spray Place 2 sprays into both nostrils daily.    [provider]  ibuprofen (ADVIL) 200 MG tablet Take 800 mg by mouth every 6 (six) hours as needed for mild pain or moderate pain (Back pain).    [provider]  melatonin 5 MG TABS Take 5 mg by mouth once a week.    [provider]  Multiple Vitamin (MULTIVITAMIN) tablet Take 1 tablet by mouth daily.    [provider]  PARoxetine (PAXIL) 20 MG tablet Take 1 tablet (20 mg total) by mouth daily. 06/22/21   Lindell Spar, MD  polyethylene glycol-electrolytes (TRILYTE) 420 g solution Take 4,000 mLs by mouth as directed. 08/01/21   Rogene Houston, MD  promethazine-dextromethorphan (PROMETHAZINE-DM)  6.25-15 MG/5ML syrup Take 5 mLs by mouth every 6 (six) hours as needed for cough. 03/29/21   Lindell Spar, MD  rosuvastatin (CRESTOR) 5 MG tablet Take 1 tablet (5 mg total) by mouth daily. 02/16/21   Lindell Spar, MD  traZODone (DESYREL) 100 MG tablet Take 1 tablet (100 mg total) by mouth at bedtime as needed for sleep. 06/22/21   Lindell Spar, MD    Allergies    Patient has no known allergies.  Review of Systems   Review of Systems  Constitutional: Negative.   HENT: Negative.    Eyes: Negative.   Respiratory:  Positive for shortness of breath. Negative for cough and chest tightness.   Cardiovascular:  Positive for chest pain. Negative for palpitations and leg swelling.  Gastrointestinal: Negative.   Genitourinary: Negative.   Musculoskeletal: Negative.   Neurological: Negative.   Psychiatric/Behavioral:  Positive for agitation. Negative for self-injury, sleep disturbance and suicidal ideas. The patient is nervous/anxious and is hyperactive.    Physical Exam Updated Vital Signs BP 135/81 (BP Location: Right Arm)   Pulse 81   Resp 18   Ht 5\' 6"  (1.676 m)   Wt 104.3 kg   SpO2 100%   BMI 37.12 kg/m   Physical Exam Vitals and nursing note reviewed.  Constitutional:      Appearance: She is obese. She is not ill-appearing or toxic-appearing.  HENT:     Head: Normocephalic and atraumatic.     Nose: Nose normal.     Mouth/Throat:     Mouth: Mucous membranes are moist.     Pharynx: Oropharynx is clear. Uvula midline. No oropharyngeal exudate or posterior oropharyngeal erythema.     Tonsils: No tonsillar exudate.  Eyes:     General: Lids are normal. Vision grossly intact.        Right eye: No discharge.        Left eye: No discharge.     Extraocular Movements: Extraocular movements intact.     Conjunctiva/sclera: Conjunctivae normal.     Pupils: Pupils are equal, round, and reactive to light.  Neck:     Trachea: Trachea and phonation normal.     Meningeal: Brudzinski's  sign and Kernig's sign absent.  Cardiovascular:     Rate and Rhythm: Normal rate and regular rhythm.     Pulses: Normal pulses.     Heart sounds: Normal heart sounds. No murmur heard. Pulmonary:     Effort: Pulmonary effort is normal. No tachypnea, bradypnea, accessory muscle usage, prolonged expiration or respiratory distress.     Breath sounds: Normal breath sounds. No wheezing or rales.  Chest:     Chest wall: No mass, lacerations, deformity, swelling, tenderness, crepitus or edema.  Abdominal:  General: Bowel sounds are normal. There is no distension.     Palpations: Abdomen is soft.     Tenderness: There is no abdominal tenderness. There is no right CVA tenderness, left CVA tenderness, guarding or rebound.  Musculoskeletal:        General: No deformity.     Cervical back: Normal range of motion and neck supple. No edema, rigidity or crepitus. No pain with movement, spinous process tenderness or muscular tenderness.     Right lower leg: No edema.     Left lower leg: No edema.  Lymphadenopathy:     Cervical: No cervical adenopathy.  Skin:    General: Skin is warm and dry.     Capillary Refill: Capillary refill takes less than 2 seconds.  Neurological:     General: No focal deficit present.     Mental Status: She is alert and oriented to person, place, and time. Mental status is at baseline.  Psychiatric:        Attention and Perception: She is inattentive.        Mood and Affect: Mood is anxious.        Speech: Speech is rapid and pressured and tangential.        Thought Content: Thought content does not include homicidal or suicidal ideation.     Comments: Does not appear to be responding to internal stimuli.    ED Results / Procedures / Treatments   Labs (all labs ordered are listed, but only abnormal results are displayed) Labs Reviewed  COMPREHENSIVE METABOLIC PANEL  ETHANOL  RAPID URINE DRUG SCREEN, HOSP PERFORMED  CBC WITH DIFFERENTIAL/PLATELET  TROPONIN I (HIGH  SENSITIVITY)    EKG EKG Interpretation  Date/Time:  Wednesday August 09 2021 17:19:21 EDT Ventricular Rate:  87 PR Interval:  172 QRS Duration: 84 QT Interval:  388 QTC Calculation: 466 R Axis:   92 Text Interpretation: Normal sinus rhythm Rightward axis Borderline ECG Early repolarization No significant change since last tracing Confirmed by Calvert Cantor [phone]) on 08/09/2021 5:29:03 PM  Radiology No results found.  Procedures Procedures   Medications Ordered in ED Medications - No data to display  ED Course  I have reviewed the triage vital signs and the nursing notes.  Pertinent labs & imaging results that were available during my care of the patient were reviewed by me and considered in my medical decision making (see chart for details).    MDM Rules/Calculators/A&P                         55 year old female presents with concern for intermittent left-sided chest pain throughout the day with associated shortness of breath.  Not exertional, does not radiate, and no associated palpitations.  Vital signs are normal intake.  Cardiopulmonary exam is normal, abdominal exam is benign.  Cardiopulmonary exam is normal, abdominal exam.  Patient does appear very anxious at this time. PERC negative.   EKG is reassuring with normal sinus rhythm without ischemic changes.  CBC with mild leukocytosis of 11,000.  Chest x-ray also reassuring without acute cardiopulmonary disease.  Care of this patient signed out to oncoming provider Etta Quill, NP, at time of shift change.  She is pending laboratory studies including CMP and troponin.  Disposition pending labs.  All pertinent HPI, physical exam, and laboratory findings were discussed with him prior to my departure.  I appreciate his collaboration care of this patient.  This chart was dictated using voice recognition software,  Dragon. Despite the best efforts of this provider to proofread and correct errors, errors may still occur  which can change documentation meaning.   Final Clinical Impression(s) / ED Diagnoses Final diagnoses:  None    Rx / DC Orders ED Discharge Orders     None        Aura Dials 08/11/21 1409    Truddie Hidden, MD 08/11/21 [phone]

## 2021-08-09 NOTE — ED Notes (Signed)
Date and time results received: 08/09/21 1934  Test: ETOH Critical Value: 334  Name of Provider Notified: Karle Starch, MD  Orders Received? Or Actions Taken?: acknowledged

## 2021-08-09 NOTE — ED Triage Notes (Signed)
Pt states she has chest pain and shortness of breath. Pt has severe anxiety in triage. Pt last drank ETOH 1 hour ago. Pt husband here and states the patient isnt being very honest about the way she is feeling.

## 2021-08-09 NOTE — Discharge Instructions (Signed)
You are cleared to begin treatment for alcohol rehab. Please follow-up with your primary care provider.

## 2021-08-11 ENCOUNTER — Ambulatory Visit: Payer: 59 | Admitting: Internal Medicine

## 2021-08-12 ENCOUNTER — Encounter (HOSPITAL_COMMUNITY): Payer: Self-pay | Admitting: Emergency Medicine

## 2021-08-12 ENCOUNTER — Emergency Department (HOSPITAL_COMMUNITY): Payer: 59

## 2021-08-12 ENCOUNTER — Inpatient Hospital Stay (HOSPITAL_COMMUNITY)
Admission: EM | Admit: 2021-08-12 | Discharge: 2021-08-18 | DRG: 897 | Disposition: A | Discharge status: Rehabilitation Facility | Payer: 59 | Attending: Family Medicine | Admitting: Family Medicine

## 2021-08-12 ENCOUNTER — Other Ambulatory Visit: Payer: Self-pay

## 2021-08-12 DIAGNOSIS — Z6841 Body Mass Index (BMI) 40.0 and over, adult: Secondary | ICD-10-CM

## 2021-08-12 DIAGNOSIS — Y906 Blood alcohol level of 120-199 mg/100 ml: Secondary | ICD-10-CM | POA: Diagnosis present

## 2021-08-12 DIAGNOSIS — E860 Dehydration: Secondary | ICD-10-CM | POA: Diagnosis present

## 2021-08-12 DIAGNOSIS — R197 Diarrhea, unspecified: Secondary | ICD-10-CM | POA: Diagnosis present

## 2021-08-12 DIAGNOSIS — E871 Hypo-osmolality and hyponatremia: Secondary | ICD-10-CM | POA: Diagnosis present

## 2021-08-12 DIAGNOSIS — F1010 Alcohol abuse, uncomplicated: Secondary | ICD-10-CM | POA: Diagnosis not present

## 2021-08-12 DIAGNOSIS — F10229 Alcohol dependence with intoxication, unspecified: Secondary | ICD-10-CM | POA: Diagnosis present

## 2021-08-12 DIAGNOSIS — E8729 Other acidosis: Secondary | ICD-10-CM | POA: Diagnosis present

## 2021-08-12 DIAGNOSIS — F10930 Alcohol use, unspecified with withdrawal, uncomplicated: Secondary | ICD-10-CM

## 2021-08-12 DIAGNOSIS — Z8679 Personal history of other diseases of the circulatory system: Secondary | ICD-10-CM | POA: Diagnosis present

## 2021-08-12 DIAGNOSIS — D61818 Other pancytopenia: Secondary | ICD-10-CM | POA: Diagnosis present

## 2021-08-12 DIAGNOSIS — F10939 Alcohol use, unspecified with withdrawal, unspecified: Secondary | ICD-10-CM | POA: Diagnosis present

## 2021-08-12 DIAGNOSIS — F32A Depression, unspecified: Secondary | ICD-10-CM | POA: Diagnosis present

## 2021-08-12 DIAGNOSIS — Z87891 Personal history of nicotine dependence: Secondary | ICD-10-CM

## 2021-08-12 DIAGNOSIS — K7010 Alcoholic hepatitis without ascites: Secondary | ICD-10-CM | POA: Diagnosis present

## 2021-08-12 DIAGNOSIS — F419 Anxiety disorder, unspecified: Secondary | ICD-10-CM | POA: Diagnosis present

## 2021-08-12 DIAGNOSIS — Z9114 Patient's other noncompliance with medication regimen: Secondary | ICD-10-CM | POA: Diagnosis not present

## 2021-08-12 DIAGNOSIS — I4891 Unspecified atrial fibrillation: Secondary | ICD-10-CM | POA: Diagnosis not present

## 2021-08-12 DIAGNOSIS — E876 Hypokalemia: Secondary | ICD-10-CM | POA: Diagnosis present

## 2021-08-12 DIAGNOSIS — F10129 Alcohol abuse with intoxication, unspecified: Secondary | ICD-10-CM | POA: Diagnosis not present

## 2021-08-12 DIAGNOSIS — I10 Essential (primary) hypertension: Secondary | ICD-10-CM | POA: Diagnosis present

## 2021-08-12 DIAGNOSIS — Z20822 Contact with and (suspected) exposure to covid-19: Secondary | ICD-10-CM | POA: Diagnosis present

## 2021-08-12 DIAGNOSIS — F10231 Alcohol dependence with withdrawal delirium: Principal | ICD-10-CM | POA: Diagnosis present

## 2021-08-12 DIAGNOSIS — I480 Paroxysmal atrial fibrillation: Secondary | ICD-10-CM | POA: Diagnosis present

## 2021-08-12 DIAGNOSIS — Z79899 Other long term (current) drug therapy: Secondary | ICD-10-CM

## 2021-08-12 DIAGNOSIS — Z91128 Patient's intentional underdosing of medication regimen for other reason: Secondary | ICD-10-CM | POA: Diagnosis not present

## 2021-08-12 HISTORY — DX: Paroxysmal atrial fibrillation: I48.0

## 2021-08-12 HISTORY — DX: Unspecified atrial fibrillation: I48.91

## 2021-08-12 HISTORY — DX: Alcohol abuse, uncomplicated: F10.10

## 2021-08-12 LAB — COMPREHENSIVE METABOLIC PANEL
ALT: 70 U/L — ABNORMAL HIGH (ref 0–44)
AST: 192 U/L — ABNORMAL HIGH (ref 15–41)
Albumin: 4.2 g/dL (ref 3.5–5.0)
Alkaline Phosphatase: 172 U/L — ABNORMAL HIGH (ref 38–126)
Anion gap: 20 — ABNORMAL HIGH (ref 5–15)
BUN: 20 mg/dL (ref 6–20)
CO2: 11 mmol/L — ABNORMAL LOW (ref 22–32)
Calcium: 7.9 mg/dL — ABNORMAL LOW (ref 8.9–10.3)
Chloride: 89 mmol/L — ABNORMAL LOW (ref 98–111)
Creatinine, Ser: 1.16 mg/dL — ABNORMAL HIGH (ref 0.44–1.00)
GFR, Estimated: 56 mL/min — ABNORMAL LOW (ref >60)
Glucose, Bld: 74 mg/dL (ref 70–99)
Potassium: 4.1 mmol/L (ref 3.5–5.1)
Sodium: 120 mmol/L — ABNORMAL LOW (ref 135–145)
Total Bilirubin: 1.3 mg/dL — ABNORMAL HIGH (ref 0.3–1.2)
Total Protein: 7.4 g/dL (ref 6.5–8.1)

## 2021-08-12 LAB — BRAIN NATRIURETIC PEPTIDE: B Natriuretic Peptide: 22 pg/mL (ref 0.0–100.0)

## 2021-08-12 LAB — CBC WITH DIFFERENTIAL/PLATELET
Abs Immature Granulocytes: 0.03 10*3/uL (ref 0.00–0.07)
Basophils Absolute: 0.1 10*3/uL (ref 0.0–0.1)
Basophils Relative: 0 %
Eosinophils Absolute: 0 10*3/uL (ref 0.0–0.5)
Eosinophils Relative: 0 %
HCT: 38.7 % (ref 36.0–46.0)
Hemoglobin: 13.4 g/dL (ref 12.0–15.0)
Immature Granulocytes: 0 %
Lymphocytes Relative: 20 %
Lymphs Abs: 2.3 10*3/uL (ref 0.7–4.0)
MCH: 33.9 pg (ref 26.0–34.0)
MCHC: 34.6 g/dL (ref 30.0–36.0)
MCV: 98 fL (ref 80.0–100.0)
Monocytes Absolute: 0.4 10*3/uL (ref 0.1–1.0)
Monocytes Relative: 4 %
Neutro Abs: 8.8 10*3/uL — ABNORMAL HIGH (ref 1.7–7.7)
Neutrophils Relative %: 76 %
Platelets: 244 10*3/uL (ref 150–400)
RBC: 3.95 MIL/uL (ref 3.87–5.11)
RDW: 12.4 % (ref 11.5–15.5)
WBC: 11.6 10*3/uL — ABNORMAL HIGH (ref 4.0–10.5)
nRBC: 0 % (ref 0.0–0.2)

## 2021-08-12 LAB — PHOSPHORUS: Phosphorus: 2.8 mg/dL (ref 2.5–4.6)

## 2021-08-12 LAB — MAGNESIUM: Magnesium: 1.6 mg/dL — ABNORMAL LOW (ref 1.7–2.4)

## 2021-08-12 LAB — TROPONIN I (HIGH SENSITIVITY)
Troponin I (High Sensitivity): 4 ng/L (ref <18)
Troponin I (High Sensitivity): 8 ng/L (ref <18)

## 2021-08-12 LAB — ETHANOL: Alcohol, Ethyl (B): 177 mg/dL — ABNORMAL HIGH (ref <10)

## 2021-08-12 LAB — CBG MONITORING, ED: Glucose-Capillary: 80 mg/dL (ref 70–99)

## 2021-08-12 MED ORDER — DEXTROSE 5 % IV SOLN: Freq: Once | INTRAVENOUS | Status: DC

## 2021-08-12 MED ORDER — ONDANSETRON HCL 4 MG/2ML IJ SOLN
4.0000 mg | Freq: Once | INTRAMUSCULAR | Status: AC
  Administered 2021-08-12: 4 mg via INTRAVENOUS
  Filled 2021-08-12: qty 2

## 2021-08-12 MED ORDER — DILTIAZEM HCL-DEXTROSE 125-5 MG/125ML-% IV SOLN (PREMIX)
5.0000 mg/h | INTRAVENOUS | Status: DC
  Administered 2021-08-12: 5 mg/h via INTRAVENOUS
  Administered 2021-08-13: 10 mg/h via INTRAVENOUS
  Filled 2021-08-12 (×2): qty 125

## 2021-08-12 MED ORDER — ONDANSETRON HCL 4 MG/2ML IJ SOLN
4.0000 mg | INTRAMUSCULAR | Status: AC
  Administered 2021-08-12: 4 mg via INTRAVENOUS
  Filled 2021-08-12: qty 2

## 2021-08-12 MED ORDER — MAGNESIUM SULFATE 2 GM/50ML IV SOLN
2.0000 g | Freq: Once | INTRAVENOUS | Status: AC
  Administered 2021-08-12: 2 g via INTRAVENOUS
  Filled 2021-08-12: qty 50

## 2021-08-12 MED ORDER — SODIUM CHLORIDE 0.9 % IV BOLUS
1000.0000 mL | Freq: Once | INTRAVENOUS | Status: AC
  Administered 2021-08-12: 1000 mL via INTRAVENOUS

## 2021-08-12 MED ORDER — THIAMINE HCL 100 MG PO TABS
200.0000 mg | ORAL_TABLET | Freq: Once | ORAL | Status: AC
  Administered 2021-08-12: 200 mg via ORAL
  Filled 2021-08-12: qty 2

## 2021-08-12 MED ORDER — DEXTROSE-NACL 5-0.9 % IV SOLN: Freq: Once | INTRAVENOUS | Status: AC

## 2021-08-12 MED ORDER — LORAZEPAM 2 MG/ML IJ SOLN: 1.0000 mg | Freq: Once | INTRAMUSCULAR | Status: AC

## 2021-08-12 MED ORDER — LORAZEPAM 2 MG/ML IJ SOLN
INTRAMUSCULAR | Status: AC
  Administered 2021-08-12: 1 mg via INTRAVENOUS
  Filled 2021-08-12: qty 1

## 2021-08-12 MED ORDER — DILTIAZEM LOAD VIA INFUSION
15.0000 mg | Freq: Once | INTRAVENOUS | Status: AC
  Administered 2021-08-12: 15 mg via INTRAVENOUS
  Filled 2021-08-12: qty 15

## 2021-08-12 MED ORDER — LORAZEPAM 2 MG/ML IJ SOLN
2.0000 mg | Freq: Once | INTRAMUSCULAR | Status: AC
  Administered 2021-08-12: 2 mg via INTRAVENOUS
  Filled 2021-08-12: qty 1

## 2021-08-12 NOTE — H&P (Addendum)
History and Physical    Renee [name] HKV:[id number] DOB: September 26, 1966 DOA: 08/12/2021  PCP: Lindell Spar, MD   Patient coming from: Home  I have personally briefly reviewed patient's old medical records in [place]  Chief Complaint: Alcoholism  HPI: Renee [name] is a 55 y.o. female with medical history significant for obesity, hypertension, depression, atrial fibrillation. Patient presented to the ED with complaints of heavy drinking over the past 6 days.  Patient has not eaten anything in the past 2 days.  Spouse is present at her bed side.  Patient has tried unsuccessfully to quit drinking alcohol prior to this.  Last drink was about 12 hours ago, between 8 and 10 AM this morning.  She drinks multiple bottles of wine daily.  She reports daily diarrhea up to 4 times daily over the past few weeks.   She also reports central to left-sided chest pain, not related to activity, with associated difficulty breathing, that may be secondary to panic attacks, which the husband confirms she does have.  Quit smoking cigarettes about 5 years ago.  No known family history of heart attacks.  Patient was admitted about 5 weeks ago when [place] to the beach, patient had electrolyte abnormalities, dehydration and atrial fibrillation with hypotension.  She also had withdrawal from alcohol. She denies history of seizures.  Denies hallucinations at this time.  Patient is scheduled to start rehab at a facility-[facility] in [place] on Tuesday, 4th of October.  ED Course: Temperature 98.1.  Heart rate 87 - 100.  Blood pressure systolic 564P to 329J.  Sodium 120.  Blood alcohol level 177.  Potassium 4.1.  Troponin 4.  Glucose 74.  Anion gap 20, serum bicarb 11. Ativan given in ED, dextrose containing fluids started.  2 L bolus given.  Hospitalist to admit.  Review of Systems: As per HPI all other systems reviewed and negative.  Past Medical History:  Diagnosis Date   A-fib [facility])     Depression    Phreesia 10/17/2020   Hypertension    Phreesia 10/17/2020    Past Surgical History:  Procedure Laterality Date   ABDOMINAL HYSTERECTOMY N/A    Phreesia 10/17/2020   CHOLECYSTECTOMY     COLONOSCOPY N/A 01/26/2021   Procedure: COLONOSCOPY;  Surgeon: Rogene Houston, MD;  [facility];  Service: Endoscopy;  Laterality: N/A;  AM   SMALL INTESTINE SURGERY N/A    Phreesia 10/17/2020   TONSILLECTOMY       reports that she quit smoking about 4 years ago. Her smoking use included cigarettes. She has never used smokeless tobacco. She reports current alcohol use of about 4.0 standard drinks per week. She reports that she does not use drugs.  Not on File  No family history of heart disease.  Prior to Admission medications   Medication Sig Start Date End Date Taking? Authorizing Provider  acetaminophen (TYLENOL) 500 MG tablet Take 1,000 mg by mouth every 6 (six) hours as needed for mild pain or headache.   Yes [provider]  atenolol (TENORMIN) 50 MG tablet Take 1 tablet (50 mg total) by mouth daily. 02/16/21  Yes Lindell Spar, MD  benazepril (LOTENSIN) 20 MG tablet Take 20 mg by mouth daily.   Yes [provider]  chlorthalidone (HYGROTON) 25 MG tablet Take 1 tablet (25 mg total) by mouth daily. 02/16/21  Yes Lindell Spar, MD  cloNIDine (CATAPRES) 0.1 MG tablet Take 1 tablet (0.1 mg total) by  mouth daily. Take 1 tablet daily 02/16/21  Yes Patel, Colin Broach, MD  esomeprazole (NEXIUM) 20 MG capsule Take 20 mg by mouth daily.   Yes [provider]  fluticasone (FLONASE) 50 MCG/ACT nasal spray Place 2 sprays into both nostrils daily as needed for allergies.   Yes [provider]  ibuprofen (ADVIL) 200 MG tablet Take 800 mg by mouth every 6 (six) hours as needed for mild pain or moderate pain (Back pain).   Yes [provider]  PARoxetine (PAXIL) 20 MG tablet Take 1 tablet (20 mg total) by mouth daily. 06/22/21  Yes Lindell Spar, MD  rosuvastatin (CRESTOR) 5 MG tablet Take 1 tablet (5 mg total) by mouth daily. 02/16/21  Yes Lindell Spar, MD  traZODone (DESYREL) 100 MG tablet Take 1 tablet (100 mg total) by mouth at bedtime as needed for sleep. 06/22/21  Yes Lindell Spar, MD  vitamin B-12 (CYANOCOBALAMIN) 1000 MCG tablet Take 1,000 mcg by mouth daily.   Yes [provider]  benazepril (LOTENSIN) 40 MG tablet Take 1 tablet (40 mg total) by mouth daily. Take 1 tablet at bedtime Patient not taking: No sig reported 02/16/21   Lindell Spar, MD  melatonin 5 MG TABS Take 5 mg by mouth at bedtime as needed (sleep). Patient not taking: Reported on 08/12/2021    [provider]  polyethylene glycol-electrolytes (TRILYTE) 420 g solution Take 4,000 mLs by mouth as directed. Patient not taking: Reported on 08/12/2021 08/01/21   Rogene Houston, MD  promethazine-dextromethorphan (PROMETHAZINE-DM) 6.25-15 MG/5ML syrup Take 5 mLs by mouth every 6 (six) hours as needed for cough. Patient not taking: Reported on 08/12/2021 03/29/21   Lindell Spar, MD    Physical Exam: Vitals:   08/12/21 1715 08/12/21 1730 08/12/21 1800 08/12/21 1830  BP:  (!) 140/92 (!) 159/93 (!) 165/85  Pulse:   92 100  Resp: 17 (!) 21 18 19   Temp:      TempSrc:      SpO2:   97% 96%  Weight:      Height:        Constitutional: Restless/jittery, Vitals:   08/12/21 1715 08/12/21 1730 08/12/21 1800 08/12/21 1830  BP:  (!) 140/92 (!) 159/93 (!) 165/85  Pulse:   92 100  Resp: 17 (!) 21 18 19   Temp:      TempSrc:      SpO2:   97% 96%  Weight:      Height:       Eyes: PERRL, lids and conjunctivae normal ENMT: Mucous membranes are dry  Neck: normal, supple, no masses, no thyromegaly Respiratory: clear to auscultation bilaterally, no wheezing, no crackles. Normal respiratory effort. No accessory muscle use.  Cardiovascular: Regular rate and rhythm, no murmurs / rubs / gallops. No extremity edema. 2+ pedal pulses.  Abdomen: no  tenderness, no masses palpated. No hepatosplenomegaly. Bowel sounds positive.  Musculoskeletal: no clubbing / cyanosis. No joint deformity upper and lower extremities. Good ROM, no contractures. Normal muscle tone.  Skin: no rashes, lesions, ulcers. No induration Neurologic: No apparent cranial nerve abnormality, moving extremities spontaneously Psychiatric: Normal judgment and insight. Alert and oriented x 3. Normal mood.   Labs on Admission: I have personally reviewed following labs and imaging studies  CBC: Recent Labs  Lab 08/09/21 1830 08/12/21 1537  WBC 11.1* 11.6*  NEUTROABS 6.5 8.8*  HGB 13.6 13.4  HCT 39.5 38.7  MCV 98.3 98.0  PLT 347 244   Basic  Metabolic Panel: Recent Labs  Lab 08/09/21 1830 08/12/21 1537  NA 129* 120*  K 4.1 4.1  CL 97* 89*  CO2 19* 11*  GLUCOSE 94 74  BUN 14 20  CREATININE 0.77 1.16*  CALCIUM 8.2* 7.9*   Liver Function Tests: Recent Labs  Lab 08/09/21 1830 08/12/21 1537  AST 53* 192*  ALT 34 70*  ALKPHOS 114 172*  BILITOT 0.4 1.3*  PROT 7.4 7.4  ALBUMIN 4.2 4.2    Radiological Exams on Admission: DG Chest Port 1 View  Result Date: 08/12/2021 CLINICAL DATA:  Chest pain and shortness of breath. EXAM: PORTABLE CHEST 1 VIEW COMPARISON:  Chest radiograph August 09, 2021. FINDINGS: Monitoring leads overlie the patient. Stable cardiac and mediastinal contours. No large area pulmonary consolidation. No pleural effusion or pneumothorax. Thoracic spine degenerative changes. Chronic right rib fractures. IMPRESSION: No active disease. Electronically Signed   By: Lovey Newcomer M.D.   On: 08/12/2021 16:45    EKG: Independently reviewed.  Sinus rhythm rate 88, QTc 481.  No significant change from prior.  Assessment/Plan Principal Problem:   Hyponatremia Active Problems:   Alcohol abuse with intoxication (HCC)   Alcohol withdrawal (HCC)   Alcoholic ketoacidosis   Atrial fibrillation with rapid ventricular response (HCC)    Hyponatremia-sodium 120. BAseline appears to be in the 132 -138. Likely 2/2 alcohol abuse. - 2 L bolus N/s given, continue D5 N/s 100cc/hr   Alcohol abuse with intoxication and withdrawal-blood alcohol level 161, last alcoholic drink about 12 hours ago, between 8 and 10 AM this morning.  Signs of early withdrawal, gittery, mildly tachycardic.  She is scheduled to start rehab on Tuesday the fourth/oct. - CIWA PRN and scheduled - Thiamine folate multivitamin  Alcoholic ketoacidosis-blood alcohol level 177, anion gap 20, serum bicarb of 11.  Likely starvation ketosis also. -Dextrose containing fluids  -Thiamine folate multivitamins  ATria Fib with RVR- EKG shows rates up to 178.  In the setting of alcohol intoxication and withdrawal. Similar occurrence during hospitalization 5 weeks ago.  - ECHO - TSH - Decision on anticoagulation held for now - Cardizem bolus 15mg  and then continue drip.   Chest pain-atypical, likely related to anxiety.  Troponin x 1 and EKG unremarkable so far.  No personal or first-degree family history of coronary artery disease. -Trend troponin  Diarrhea-reports 4 watery stools daily -C. difficile  Elevated liver enzymes-AST 192, ALT 70, consistent with alcohol induced liver disease. Hep C negative 12/2020. - trend -Acute hepatitis panel - Hold statins for now  DVT prophylaxis: Lovenox Code Status: Full code Family Communication: Spouse Richard at bedside Disposition Plan:  ~ 2 days Consults called: None Admission status: Inpt, tele  I certify that at the point of admission it is my clinical judgment that the patient will require inpatient hospital care spanning beyond 2 midnights from the point of admission due to high intensity of service, high risk for further deterioration and high frequency of surveillance required.    Bethena Roys MD [facility]  08/12/2021, 12:09 AM

## 2021-08-12 NOTE — ED Notes (Signed)
Pt ambulated to bathroom with NT and husband

## 2021-08-12 NOTE — ED Notes (Signed)
Pt states that she has been drinking heavily over the past few weeks again after weaning herself off. Last drink was around 8am today. States she plans to go into rehab center on Tuesday

## 2021-08-12 NOTE — ED Triage Notes (Signed)
Patient c/o nausea and vomiting that started this morning. Per patient drinking alcohol heavily x6 days. Per patient diarrhea but no fevers or urinary symptoms. Husband states patient hasn't eaten anything in 2 days. Patient does report some chest pain and shortness of breath. New hx of afib in August. Patient has not followed-up and is not taking medication for the Afib.

## 2021-08-12 NOTE — ED Provider Notes (Signed)
[facility] Provider Note   CSN: [id number] Arrival date & time: 08/12/21  1326     History Chief Complaint  Patient presents with   Emesis    Renee [name] is a 55 y.o. female.   Emesis  This patient is a 55 year old female, she has a history most significant for hypertension and severe alcohol abuse.  She presents to the hospital in the care of her husband with complaints of persistent vomiting and chest pain which is been going on today.  According to the medical record and with the husband reports [place] during which time she was in a metabolic crisis with multiple electrolyte abnormalities severe dehydration and severe atrial fibrillation with hypotension, she had been admitted for approximately 5 days before she was finally released.  Since that time she is gone back to drinking heavily multiple bottles of wine per day.  Today she has had a heaviness in the middle of her chest, this is worse with coughing and breathing and vomiting, it is intermittent, it is not associated with abdominal pain and there is been no diarrhea.  She reports her last drink was several hours ago.  She is scheduled to go into outpatient rehab facility within the next several days.  No fevers or chills, no blood in the stool, no blood in the vomit, no medications given prior to arrival.  Past Medical History:  Diagnosis Date   A-fib [facility])    Depression    Phreesia 10/17/2020   Hypertension    Phreesia 10/17/2020    Patient Active Problem List   Diagnosis Date Noted   Intractable vomiting 03/15/2021   Hyponatremia 03/15/2021   Aortic atherosclerosis ([place]) 02/16/2021   Insomnia 02/16/2021   Primary hypertension 10/18/2020   Depression, major, single episode, moderate ([place]) 10/18/2020   Tobacco abuse 10/18/2020   Morbid obesity ([place]) 10/18/2020   GERD (gastroesophageal reflux disease) 10/18/2020     Past Surgical History:  Procedure Laterality Date   ABDOMINAL HYSTERECTOMY N/A    Phreesia 10/17/2020   CHOLECYSTECTOMY     COLONOSCOPY N/A 01/26/2021   Procedure: COLONOSCOPY;  Surgeon: Rogene Houston, MD;  [facility];  Service: Endoscopy;  Laterality: N/A;  AM   SMALL INTESTINE SURGERY N/A    Phreesia 10/17/2020   TONSILLECTOMY       OB History   No obstetric history on file.     History reviewed. No pertinent family history.  Social History   Tobacco Use   Smoking status: Former    Types: Cigarettes    Quit date: 2018    Years since quitting: 4.7   Smokeless tobacco: Never   Tobacco comments:    35 pack-year  Vaping Use   Vaping Use: Never used  Substance Use Topics   Alcohol use: Yes    Alcohol/week: 4.0 standard drinks    Types: 4 Glasses of wine per week   Drug use: Never    Home Medications Prior to Admission medications   Medication Sig Start Date End Date Taking? Authorizing Provider  acetaminophen (TYLENOL) 500 MG tablet Take 1,000 mg by mouth every 6 (six) hours as needed for mild pain or headache.   Yes [provider]  atenolol (TENORMIN) 50 MG tablet Take 1 tablet (50 mg total) by mouth daily. 02/16/21  Yes Lindell Spar, MD  benazepril (LOTENSIN) 20 MG tablet Take 20 mg by mouth  daily.   Yes [provider]  chlorthalidone (HYGROTON) 25 MG tablet Take 1 tablet (25 mg total) by mouth daily. 02/16/21  Yes Lindell Spar, MD  cloNIDine (CATAPRES) 0.1 MG tablet Take 1 tablet (0.1 mg total) by mouth daily. Take 1 tablet daily 02/16/21  Yes Patel, Colin Broach, MD  esomeprazole (NEXIUM) 20 MG capsule Take 20 mg by mouth daily.   Yes [provider]  fluticasone (FLONASE) 50 MCG/ACT nasal spray Place 2 sprays into both nostrils daily as needed for allergies.   Yes [provider]  ibuprofen (ADVIL) 200 MG tablet Take 800 mg by mouth every 6 (six) hours as needed for mild pain or moderate pain (Back pain).   Yes  [provider]  PARoxetine (PAXIL) 20 MG tablet Take 1 tablet (20 mg total) by mouth daily. 06/22/21  Yes Lindell Spar, MD  rosuvastatin (CRESTOR) 5 MG tablet Take 1 tablet (5 mg total) by mouth daily. 02/16/21  Yes Lindell Spar, MD  traZODone (DESYREL) 100 MG tablet Take 1 tablet (100 mg total) by mouth at bedtime as needed for sleep. 06/22/21  Yes Lindell Spar, MD  vitamin B-12 (CYANOCOBALAMIN) 1000 MCG tablet Take 1,000 mcg by mouth daily.   Yes [provider]  benazepril (LOTENSIN) 40 MG tablet Take 1 tablet (40 mg total) by mouth daily. Take 1 tablet at bedtime Patient not taking: No sig reported 02/16/21   Lindell Spar, MD  melatonin 5 MG TABS Take 5 mg by mouth at bedtime as needed (sleep). Patient not taking: Reported on 08/12/2021    [provider]  polyethylene glycol-electrolytes (TRILYTE) 420 g solution Take 4,000 mLs by mouth as directed. Patient not taking: Reported on 08/12/2021 08/01/21   Rogene Houston, MD  promethazine-dextromethorphan (PROMETHAZINE-DM) 6.25-15 MG/5ML syrup Take 5 mLs by mouth every 6 (six) hours as needed for cough. Patient not taking: Reported on 08/12/2021 03/29/21   Lindell Spar, MD    Allergies    Patient has no allergy information on record.  Review of Systems   Review of Systems  Gastrointestinal:  Positive for vomiting.  All other systems reviewed and are negative.  Physical Exam Updated Vital Signs BP (!) 140/92   Pulse 99   Temp 98.1 F (36.7 C) (Oral)   Resp (!) 21   Ht 1.676 m (5\' 6" )   Wt 117.9 kg   SpO2 100%   BMI 41.97 kg/m   Physical Exam Vitals and nursing note reviewed.  Constitutional:      General: She is not in acute distress.    Appearance: She is well-developed.  HENT:     Head: Normocephalic and atraumatic.     Mouth/Throat:     Mouth: Mucous membranes are dry.     Pharynx: No oropharyngeal exudate.  Eyes:     General: No scleral icterus.       Right eye: No discharge.         Left eye: No discharge.     Conjunctiva/sclera: Conjunctivae normal.     Pupils: Pupils are equal, round, and reactive to light.  Neck:     Thyroid: No thyromegaly.     Vascular: No JVD.  Cardiovascular:     Rate and Rhythm: Normal rate and regular rhythm.     Heart sounds: Normal heart sounds. No murmur heard.   No friction rub. No gallop.     Comments: Normal heart rate and peripheral pulses, no crepitance over the  chest or the neck Pulmonary:     Effort: Pulmonary effort is normal. No respiratory distress.     Breath sounds: Normal breath sounds. No wheezing or rales.     Comments: No abnormal lung sounds, speaks in full sentences, oxygen of 96% on room air Abdominal:     General: Bowel sounds are normal. There is no distension.     Palpations: Abdomen is soft. There is no mass.     Tenderness: There is no abdominal tenderness.     Comments: No abdominal tenderness to palpation  Musculoskeletal:        General: No tenderness. Normal range of motion.     Cervical back: Normal range of motion and neck supple.  Lymphadenopathy:     Cervical: No cervical adenopathy.  Skin:    General: Skin is warm and dry.     Findings: No erythema or rash.  Neurological:     General: No focal deficit present.     Mental Status: She is alert.     Coordination: Coordination normal.     Comments: Clear speech, normal movements in all 4 extremities, normal grips bilaterally, straight leg raise bilaterally, cranial nerves III through XII normal  Psychiatric:        Behavior: Behavior normal.    ED Results / Procedures / Treatments   Labs (all labs ordered are listed, but only abnormal results are displayed) Labs Reviewed  ETHANOL - Abnormal; Notable for the following components:      Result Value   Alcohol, Ethyl (B) 177 (*)    All other components within normal limits  COMPREHENSIVE METABOLIC PANEL - Abnormal; Notable for the following components:   Sodium 120 (*)    Chloride 89 (*)     CO2 11 (*)    Creatinine, Ser 1.16 (*)    Calcium 7.9 (*)    AST 192 (*)    ALT 70 (*)    Alkaline Phosphatase 172 (*)    Total Bilirubin 1.3 (*)    GFR, Estimated 56 (*)    Anion gap 20 (*)    All other components within normal limits  CBC WITH DIFFERENTIAL/PLATELET - Abnormal; Notable for the following components:   WBC 11.6 (*)    Neutro Abs 8.8 (*)    All other components within normal limits  SARS CORONAVIRUS 2 (TAT 6-24 HRS)  BRAIN NATRIURETIC PEPTIDE  VITAMIN B1  TROPONIN I (HIGH SENSITIVITY)    EKG EKG Interpretation  Date/Time:  Saturday August 12 2021 16:13:31 EDT Ventricular Rate:  88 PR Interval:  165 QRS Duration: 97 QT Interval:  397 QTC Calculation: 481 R Axis:   85 Text Interpretation: Sinus rhythm RAE, consider biatrial enlargement Confirmed by Noemi Chapel [phone]) on 08/12/2021 6:19:04 PM  Radiology DG Chest Port 1 View  Result Date: 08/12/2021 CLINICAL DATA:  Chest pain and shortness of breath. EXAM: PORTABLE CHEST 1 VIEW COMPARISON:  Chest radiograph August 09, 2021. FINDINGS: Monitoring leads overlie the patient. Stable cardiac and mediastinal contours. No large area pulmonary consolidation. No pleural effusion or pneumothorax. Thoracic spine degenerative changes. Chronic right rib fractures. IMPRESSION: No active disease. Electronically Signed   By: Lovey Newcomer M.D.   On: 08/12/2021 16:45    Procedures .Critical Care Performed by: Noemi Chapel, MD Authorized by: Noemi Chapel, MD   Critical care provider statement:    Critical care time (minutes):  35   Critical care time was exclusive of:  Separately billable procedures and treating other patients and  teaching time   Critical care was necessary to treat or prevent imminent or life-threatening deterioration of the following conditions:  Metabolic crisis   Critical care was time spent personally by me on the following activities:  Blood draw for specimens, development of treatment plan with  patient or surrogate, discussions with consultants, evaluation of patient's response to treatment, examination of patient, obtaining history from patient or surrogate, ordering and performing treatments and interventions, ordering and review of laboratory studies, ordering and review of radiographic studies, pulse oximetry, re-evaluation of patient's condition and review of old charts Comments:         Medications Ordered in ED Medications  LORazepam (ATIVAN) injection 2 mg (has no administration in time range)  sodium chloride 0.9 % bolus 1,000 mL (has no administration in time range)  thiamine tablet 200 mg (has no administration in time range)  dextrose 5 % solution (has no administration in time range)  ondansetron (ZOFRAN) injection 4 mg (has no administration in time range)  sodium chloride 0.9 % bolus 1,000 mL (1,000 mLs Intravenous New Bag/Given 08/12/21 1604)  ondansetron (ZOFRAN) injection 4 mg (4 mg Intravenous Given 08/12/21 1602)    ED Course  I have reviewed the triage vital signs and the nursing notes.  Pertinent labs & imaging results that were available during my care of the patient were reviewed by me and considered in my medical decision making (see chart for details).    MDM Rules/Calculators/A&P                           This patient has history that is primarily significant for her alcoholism and hypertension.  She was diagnosed with atrial fibrillation when she was in the hospital last month but is not taking any of those medications.  She is not in atrial fibrillation at this time, this may have been holiday heart.  The patient does not appear to be in any distress however given her significant underlying alcoholism and her now chest pain with vomiting will need to rule out mediastinal abnormalities or injury, peptic ulcer disease, coronary disease, pneumothorax, chest x-ray labs and EKG will be ordered.  The patient is agreeable to the plan.  She does not appear to  be in acute withdrawal at this time as she is not tachycardic diaphoretic or actively vomiting.  There is no tremor  Hyponatremia AKA - acidosis Needs IV fluids, Ativan, thiamine, dextrose, patient critically ill with alcoholic ketoacidosis and severe hyponatremia, will admit to hospitalist  Final Clinical Impression(s) / ED Diagnoses Final diagnoses:  Alcoholic ketoacidosis  Hyponatremia    Rx / DC Orders ED Discharge Orders     None        Noemi Chapel, MD 08/12/21 1820

## 2021-08-13 ENCOUNTER — Inpatient Hospital Stay (HOSPITAL_COMMUNITY): Payer: 59

## 2021-08-13 ENCOUNTER — Encounter (HOSPITAL_COMMUNITY): Payer: Self-pay | Admitting: Internal Medicine

## 2021-08-13 DIAGNOSIS — I4891 Unspecified atrial fibrillation: Secondary | ICD-10-CM | POA: Diagnosis not present

## 2021-08-13 LAB — COMPREHENSIVE METABOLIC PANEL
ALT: 63 U/L — ABNORMAL HIGH (ref 0–44)
AST: 133 U/L — ABNORMAL HIGH (ref 15–41)
Albumin: 4.1 g/dL (ref 3.5–5.0)
Alkaline Phosphatase: 162 U/L — ABNORMAL HIGH (ref 38–126)
Anion gap: 16 — ABNORMAL HIGH (ref 5–15)
BUN: 14 mg/dL (ref 6–20)
CO2: 15 mmol/L — ABNORMAL LOW (ref 22–32)
Calcium: 8.2 mg/dL — ABNORMAL LOW (ref 8.9–10.3)
Chloride: 96 mmol/L — ABNORMAL LOW (ref 98–111)
Creatinine, Ser: 0.99 mg/dL (ref 0.44–1.00)
GFR, Estimated: >60 mL/min (ref >60)
Glucose, Bld: 116 mg/dL — ABNORMAL HIGH (ref 70–99)
Potassium: 3.6 mmol/L (ref 3.5–5.1)
Sodium: 127 mmol/L — ABNORMAL LOW (ref 135–145)
Total Bilirubin: 1.9 mg/dL — ABNORMAL HIGH (ref 0.3–1.2)
Total Protein: 7.3 g/dL (ref 6.5–8.1)

## 2021-08-13 LAB — ECHOCARDIOGRAM COMPLETE
Area-P 1/2: 3.37 cm2
Height: 66 in
S' Lateral: 3.1 cm
Weight: 3989.44 oz

## 2021-08-13 LAB — CBC
HCT: 37.8 % (ref 36.0–46.0)
Hemoglobin: 12.9 g/dL (ref 12.0–15.0)
MCH: 33.6 pg (ref 26.0–34.0)
MCHC: 34.1 g/dL (ref 30.0–36.0)
MCV: 98.4 fL (ref 80.0–100.0)
Platelets: 219 10*3/uL (ref 150–400)
RBC: 3.84 MIL/uL — ABNORMAL LOW (ref 3.87–5.11)
RDW: 12.5 % (ref 11.5–15.5)
WBC: 11.1 10*3/uL — ABNORMAL HIGH (ref 4.0–10.5)
nRBC: 0 % (ref 0.0–0.2)

## 2021-08-13 LAB — TSH: TSH: 2.029 u[IU]/mL (ref 0.350–4.500)

## 2021-08-13 LAB — RESP PANEL BY RT-PCR (FLU A&B, COVID) ARPGX2
Influenza A by PCR: NEGATIVE
Influenza B by PCR: NEGATIVE
SARS Coronavirus 2 by RT PCR: NEGATIVE

## 2021-08-13 LAB — SARS CORONAVIRUS 2 (TAT 6-24 HRS): SARS Coronavirus 2: NEGATIVE

## 2021-08-13 LAB — MRSA NEXT GEN BY PCR, NASAL: MRSA by PCR Next Gen: NOT DETECTED

## 2021-08-13 MED ORDER — THIAMINE HCL 100 MG/ML IJ SOLN: 100.0000 mg | Freq: Every day | INTRAMUSCULAR | Status: DC

## 2021-08-13 MED ORDER — DEXTROSE-NACL 5-0.9 % IV SOLN: INTRAVENOUS | Status: DC

## 2021-08-13 MED ORDER — THIAMINE HCL 100 MG PO TABS
100.0000 mg | ORAL_TABLET | Freq: Every day | ORAL | Status: DC
  Administered 2021-08-13 – 2021-08-18 (×6): 100 mg via ORAL
  Filled 2021-08-13 (×6): qty 1

## 2021-08-13 MED ORDER — ADULT MULTIVITAMIN W/MINERALS CH
1.0000 | ORAL_TABLET | Freq: Every day | ORAL | Status: DC
  Administered 2021-08-13 – 2021-08-18 (×6): 1 via ORAL
  Filled 2021-08-13 (×6): qty 1

## 2021-08-13 MED ORDER — ENOXAPARIN SODIUM 60 MG/0.6ML IJ SOSY
60.0000 mg | PREFILLED_SYRINGE | INTRAMUSCULAR | Status: DC
  Administered 2021-08-13 – 2021-08-18 (×6): 60 mg via SUBCUTANEOUS
  Filled 2021-08-13 (×5): qty 0.6

## 2021-08-13 MED ORDER — LORAZEPAM 2 MG/ML IJ SOLN
1.0000 mg | INTRAMUSCULAR | Status: AC | PRN
  Administered 2021-08-14: 2 mg via INTRAVENOUS
  Administered 2021-08-14: 4 mg via INTRAVENOUS
  Administered 2021-08-14 – 2021-08-15 (×2): 2 mg via INTRAVENOUS
  Filled 2021-08-13 (×2): qty 1
  Filled 2021-08-13: qty 2
  Filled 2021-08-13: qty 1

## 2021-08-13 MED ORDER — CLONIDINE HCL 0.1 MG PO TABS: 0.1000 mg | ORAL_TABLET | Freq: Every day | ORAL | Status: DC

## 2021-08-13 MED ORDER — ACETAMINOPHEN 325 MG PO TABS
650.0000 mg | ORAL_TABLET | Freq: Four times a day (QID) | ORAL | Status: DC | PRN
  Administered 2021-08-17: 650 mg via ORAL
  Filled 2021-08-13: qty 2

## 2021-08-13 MED ORDER — PANTOPRAZOLE SODIUM 40 MG PO TBEC
40.0000 mg | DELAYED_RELEASE_TABLET | Freq: Every day | ORAL | Status: DC
  Administered 2021-08-13 – 2021-08-18 (×6): 40 mg via ORAL
  Filled 2021-08-13 (×6): qty 1

## 2021-08-13 MED ORDER — FOLIC ACID 1 MG PO TABS
1.0000 mg | ORAL_TABLET | Freq: Every day | ORAL | Status: DC
  Administered 2021-08-13 – 2021-08-18 (×6): 1 mg via ORAL
  Filled 2021-08-13 (×6): qty 1

## 2021-08-13 MED ORDER — LORAZEPAM 2 MG/ML IJ SOLN
0.0000 mg | Freq: Two times a day (BID) | INTRAMUSCULAR | Status: AC
  Administered 2021-08-15 (×2): 1 mg via INTRAVENOUS
  Filled 2021-08-13: qty 1

## 2021-08-13 MED ORDER — ONDANSETRON HCL 4 MG/2ML IJ SOLN
4.0000 mg | Freq: Four times a day (QID) | INTRAMUSCULAR | Status: DC | PRN
  Administered 2021-08-13 – 2021-08-15 (×4): 4 mg via INTRAVENOUS
  Filled 2021-08-13 (×4): qty 2

## 2021-08-13 MED ORDER — ACETAMINOPHEN 650 MG RE SUPP: 650.0000 mg | Freq: Four times a day (QID) | RECTAL | Status: DC | PRN

## 2021-08-13 MED ORDER — POLYETHYLENE GLYCOL 3350 17 G PO PACK: 17.0000 g | PACK | Freq: Every day | ORAL | Status: DC | PRN

## 2021-08-13 MED ORDER — LORAZEPAM 2 MG/ML IJ SOLN
0.0000 mg | Freq: Four times a day (QID) | INTRAMUSCULAR | Status: AC
Start: 2021-08-13 — End: 2021-08-15
  Administered 2021-08-13 (×4): 2 mg via INTRAVENOUS
  Administered 2021-08-14: 1 mg via INTRAVENOUS
  Administered 2021-08-14: 2 mg via INTRAVENOUS
  Filled 2021-08-13 (×4): qty 1
  Filled 2021-08-13: qty 2
  Filled 2021-08-13: qty 1

## 2021-08-13 MED ORDER — PAROXETINE HCL 20 MG PO TABS
20.0000 mg | ORAL_TABLET | Freq: Every day | ORAL | Status: DC
  Administered 2021-08-13 – 2021-08-18 (×6): 20 mg via ORAL
  Filled 2021-08-13 (×6): qty 1

## 2021-08-13 MED ORDER — ATENOLOL 25 MG PO TABS
75.0000 mg | ORAL_TABLET | Freq: Every day | ORAL | Status: DC
  Administered 2021-08-13: 75 mg via ORAL
  Filled 2021-08-13 (×2): qty 3

## 2021-08-13 MED ORDER — CHLORDIAZEPOXIDE HCL 25 MG PO CAPS
25.0000 mg | ORAL_CAPSULE | Freq: Three times a day (TID) | ORAL | Status: DC
  Administered 2021-08-13 – 2021-08-18 (×16): 25 mg via ORAL
  Filled 2021-08-13 (×16): qty 1

## 2021-08-13 MED ORDER — BENAZEPRIL HCL 10 MG PO TABS: 20.0000 mg | ORAL_TABLET | Freq: Every day | ORAL | Status: DC

## 2021-08-13 MED ORDER — ONDANSETRON HCL 4 MG PO TABS: 4.0000 mg | ORAL_TABLET | Freq: Four times a day (QID) | ORAL | Status: DC | PRN

## 2021-08-13 MED ORDER — LORAZEPAM 1 MG PO TABS
1.0000 mg | ORAL_TABLET | ORAL | Status: AC | PRN
  Administered 2021-08-13: 2 mg via ORAL
  Administered 2021-08-15: 1 mg via ORAL
  Administered 2021-08-15: 2 mg via ORAL
  Filled 2021-08-13 (×2): qty 2
  Filled 2021-08-13: qty 1

## 2021-08-13 MED ORDER — ATENOLOL 25 MG PO TABS: 50.0000 mg | ORAL_TABLET | Freq: Every day | ORAL | Status: DC

## 2021-08-13 MED ORDER — CHLORHEXIDINE GLUCONATE CLOTH 2 % EX PADS
6.0000 | MEDICATED_PAD | Freq: Every day | CUTANEOUS | Status: DC
  Administered 2021-08-13 – 2021-08-18 (×6): 6 via TOPICAL

## 2021-08-13 MED ORDER — METOPROLOL TARTRATE 5 MG/5ML IV SOLN
5.0000 mg | Freq: Once | INTRAVENOUS | Status: AC
  Administered 2021-08-13: 5 mg via INTRAVENOUS
  Filled 2021-08-13: qty 5

## 2021-08-13 NOTE — Progress Notes (Signed)
*  PRELIMINARY RESULTS* Echocardiogram 2D Echocardiogram has been performed.  Renee [name] 08/13/2021, 11:02 AM

## 2021-08-13 NOTE — Progress Notes (Signed)
Cardizem gtt stopped as HR sustaining in the 70's and Sinus Rhythm. MD made aware, will start back if needed.

## 2021-08-13 NOTE — Progress Notes (Signed)
PROGRESS NOTE   Renee [name]  ZHG:[id number] DOB: 05/23/1966 DOA: 08/12/2021 PCP: Lindell Spar, MD   Chief Complaint  Patient presents with   Emesis   Level of care: Stepdown  Brief Admission History:   55 y.o. female with medical history significant for obesity, hypertension, depression, atrial fibrillation.  Patient presented to the ED with complaints of heavy drinking over the past 6 days.  Patient has not eaten anything in the past 2 days.  Spouse is present at her bed side.  Patient has tried unsuccessfully to quit drinking alcohol prior to this.  Last drink was about 12 hours ago, between 8 and 10 AM this morning.  She drinks multiple bottles of wine daily.  She reports daily diarrhea up to 4 times daily over the past few weeks.   She also reports central to left-sided chest pain, not related to activity, with associated difficulty breathing, that may be secondary to panic attacks, which the husband confirms she does have.  Quit smoking cigarettes about 5 years ago.  No known family history of heart attacks.  Patient was admitted about 5 weeks ago when he traveled to the beach, patient had electrolyte abnormalities, dehydration and atrial fibrillation with hypotension.  She also had withdrawal from alcohol. She denies history of seizures.  Denies hallucinations at this time.   Patient is scheduled to start rehab at a facility[facility] in [place] on Tuesday, 4th of October.  Assessment & Plan:   Principal Problem:   Hyponatremia Active Problems:   Alcohol abuse with intoxication (HCC)   Alcohol withdrawal (HCC)   Alcoholic ketoacidosis   Atrial fibrillation with rapid ventricular response (HCC)  Severe alcoholism abuse and withdrawal  - Pt reports to heavy daily alcohol consumption - markedly elevated initial presenting serum alcohol - tachycardic  - continue CIWA protocol, add librium 25 mg TID - thiamine, folic acid and MVI ordered - when medically stabilized  patient willing to go to [facility] in [place] for alcohol treatment  Alcoholic ketoacidosis - continue IV fluids and hydration  Hyponatremia  - secondary to dehydration and probably chlorthalidone - DC chlorthalidone - continue IV fluids - recheck in AM   Atrial fibrillation with RVR - Afib was initially diagnosed 5 weeks ago at OSH - Pt has been noncompliant with taking medication - I suspect patient was not anticoagulated due to noncompliance, heavy alcoholism, fall risk, etc - cardiology consultation requested - CHA2DS-VASc score at least 2  - follow up TSH, 2D echo  DVT prophylaxis: enoxaparin Code Status: Full  Family Communication: spouse Richard Disposition: anticipate DC to [facility] alcohol treatment when medically stable Status is: Inpatient  Remains inpatient appropriate because:IV treatments appropriate due to intensity of illness or inability to take PO and Inpatient level of care appropriate due to severity of illness  Dispo: The patient is from: Home              Anticipated d/c is to: Home              Patient currently is not medically stable to d/c.   Difficult to place patient No   Consultants:  N/a  Procedures:  N/a  Antimicrobials:  N/a   Subjective: Pt reports that she is having "shakes" withdrawal symptoms, no hallucinations, she admits to heavy "partying" and alcohol consumption.  She says she was diagnosed with atrial fib about 5 weeks ago at OSH but not taking meds regularly.    Objective: Vitals:   08/13/21  0751 08/13/21 0800 08/13/21 0814 08/13/21 0900  BP:  120/86  133/72  Pulse:  (!) 127 95 88  Resp:  (!) 24 (!) 23 (!) 21  Temp: 98.5 F (36.9 C)     TempSrc: Oral     SpO2:  96% 95% 95%  Weight:      Height:        Intake/Output Summary (Last 24 hours) at 08/13/2021 0909 Last data filed at 08/13/2021 0600 Gross per 24 hour  Intake 658.77 ml  Output --  Net 658.77 ml   Filed Weights   08/12/21 1401 08/13/21 0310   Weight: 117.9 kg 113.1 kg   Examination:  General exam: Appears tremulous, but clear speaking, alert and oriented x 3.   Respiratory system: Clear to auscultation. Respiratory effort normal. Cardiovascular system: irregulary irregular, normal S1 & S2 heard. No JVD, murmurs, rubs, gallops or clicks. No pedal edema. Gastrointestinal system: Abdomen is nondistended, soft and nontender. No organomegaly or masses felt. Normal bowel sounds heard. Central nervous system: Alert and oriented. No focal neurological deficits. Extremities: Symmetric 5 x 5 power. Skin: No rashes, lesions or ulcers Psychiatry: Judgement and insight appear normal. Mood & affect appropriate.   Data Reviewed: I have personally reviewed following labs and imaging studies  CBC: Recent Labs  Lab 08/09/21 1830 08/12/21 1537 08/13/21 0401  WBC 11.1* 11.6* 11.1*  NEUTROABS 6.5 8.8*  --   HGB 13.6 13.4 12.9  HCT 39.5 38.7 37.8  MCV 98.3 98.0 98.4  PLT 347 244 629    Basic Metabolic Panel: Recent Labs  Lab 08/09/21 1830 08/12/21 1537 08/13/21 0401  NA 129* 120* 127*  K 4.1 4.1 3.6  CL 97* 89* 96*  CO2 19* 11* 15*  GLUCOSE 94 74 116*  BUN 14 20 14   CREATININE 0.77 1.16* 0.99  CALCIUM 8.2* 7.9* 8.2*  MG  --  1.6*  --   PHOS  --  2.8  --     GFR: Estimated Creatinine Clearance: 83.8 mL/min (by C-G formula based on SCr of 0.99 mg/dL).  Liver Function Tests: Recent Labs  Lab 08/09/21 1830 08/12/21 1537 08/13/21 0401  AST 53* 192* 133*  ALT 34 70* 63*  ALKPHOS 114 172* 162*  BILITOT 0.4 1.3* 1.9*  PROT 7.4 7.4 7.3  ALBUMIN 4.2 4.2 4.1    CBG: Recent Labs  Lab 08/12/21 2232  GLUCAP 80    Recent Results (from the past 240 hour(s))  Resp Panel by RT-PCR (Flu A&B, Covid) Nasopharyngeal Swab     Status: None   Collection Time: 08/13/21  1:25 AM   Specimen: Nasopharyngeal Swab; Nasopharyngeal(NP) swabs in vial transport medium  Result Value Ref Range Status   SARS Coronavirus 2 by RT PCR  NEGATIVE NEGATIVE Final    Comment: (NOTE) SARS-CoV-2 target nucleic acids are NOT DETECTED.  The SARS-CoV-2 RNA is generally detectable in upper respiratory specimens during the acute phase of infection. The lowest concentration of SARS-CoV-2 viral copies this assay can detect is 138 copies/mL. A negative result does not preclude SARS-Cov-2 infection and should not be used as the sole basis for treatment or other patient management decisions. A negative result may occur with  improper specimen collection/handling, submission of specimen other than nasopharyngeal swab, presence of viral mutation(s) within the areas targeted by this assay, and inadequate number of viral copies(<138 copies/mL). A negative result must be combined with clinical observations, patient history, and epidemiological information. The expected result is Negative.  Fact Sheet for Patients:  EntrepreneurPulse.com.au  Fact Sheet for Healthcare Providers:  IncredibleEmployment.be  This test is no t yet approved or cleared by the [place] FDA and  has been authorized for detection and/or diagnosis of SARS-CoV-2 by FDA under an Emergency Use Authorization (EUA). This EUA will remain  in effect (meaning this test can be used) for the duration of the COVID-19 declaration under Section 564(b)(1) of the Act, 21 U.S.C.section 360bbb-3(b)(1), unless the authorization is terminated  or revoked sooner.       Influenza A by PCR NEGATIVE NEGATIVE Final   Influenza B by PCR NEGATIVE NEGATIVE Final    Comment: (NOTE) The Xpert Xpress SARS-CoV-2/FLU/RSV plus assay is intended as an aid in the diagnosis of influenza from Nasopharyngeal swab specimens and should not be used as a sole basis for treatment. Nasal washings and aspirates are unacceptable for Xpert Xpress SARS-CoV-2/FLU/RSV testing.  Fact Sheet for Patients: EntrepreneurPulse.com.au  Fact Sheet for  Healthcare Providers: IncredibleEmployment.be  This test is not yet approved or cleared by the [place] FDA and has been authorized for detection and/or diagnosis of SARS-CoV-2 by FDA under an Emergency Use Authorization (EUA). This EUA will remain in effect (meaning this test can be used) for the duration of the COVID-19 declaration under Section 564(b)(1) of the Act, 21 U.S.C. section 360bbb-3(b)(1), unless the authorization is terminated or revoked.  Performed at [facility], [address], [place], [place] [address]      Radiology Studies: [facility] 1 View  Result Date: 08/12/2021 CLINICAL DATA:  Chest pain and shortness of breath. EXAM: PORTABLE CHEST 1 VIEW COMPARISON:  Chest radiograph August 09, 2021. FINDINGS: Monitoring leads overlie the patient. Stable cardiac and mediastinal contours. No large area pulmonary consolidation. No pleural effusion or pneumothorax. Thoracic spine degenerative changes. Chronic right rib fractures. IMPRESSION: No active disease. Electronically Signed   By: Lovey Newcomer M.D.   On: 08/12/2021 16:45    Scheduled Meds:  atenolol  75 mg Oral Daily   chlordiazePOXIDE  25 mg Oral TID   Chlorhexidine Gluconate Cloth  6 each Topical Daily   enoxaparin (LOVENOX) injection  60 mg Subcutaneous I96V   folic acid  1 mg Oral Daily   LORazepam  0-4 mg Intravenous Q6H   Followed by   Derrill Memo ON 08/15/2021] LORazepam  0-4 mg Intravenous Q12H   multivitamin with minerals  1 tablet Oral Daily   pantoprazole  40 mg Oral Daily   PARoxetine  20 mg Oral Daily   thiamine  100 mg Oral Daily   Or   thiamine  100 mg Intravenous Daily   Continuous Infusions:  dextrose 5 % and 0.9% NaCl 100 mL/hr at 08/13/21 0749   diltiazem (CARDIZEM) infusion 10 mg/hr (08/13/21 0757)     LOS: 1 day   Time spent: 33 mins   Erla Bacchi Wynetta Emery, MD How to contact the [facility] Attending or Consulting provider [place] or covering provider during after hours [place], for this patient?  Check the care team in [facility] and look for a) attending/consulting [facility] provider listed and b) the [facility] team listed Log into [email or web address] and use [facility]'s universal password to access. If you do not have the password, please contact the hospital operator. Locate the [facility] provider you are looking for under [facility] and page to a number that you can be directly reached. If you still have difficulty reaching the provider, please page the [facility] (Director on Call) for the Hospitalists listed on amion for  assistance.  08/13/2021, 9:09 AM

## 2021-08-13 NOTE — Plan of Care (Signed)

## 2021-08-13 NOTE — ED Notes (Signed)
Pt needed to use restroom and placed pt on bed pan. Had to clean pt and change all the linen on the bed. Gave pt a clean gown and linen for the bed. Placed a female purwick on pt.

## 2021-08-14 ENCOUNTER — Inpatient Hospital Stay (HOSPITAL_COMMUNITY): Payer: 59

## 2021-08-14 ENCOUNTER — Encounter (HOSPITAL_COMMUNITY): Payer: Self-pay | Admitting: Internal Medicine

## 2021-08-14 DIAGNOSIS — I4891 Unspecified atrial fibrillation: Secondary | ICD-10-CM

## 2021-08-14 DIAGNOSIS — I10 Essential (primary) hypertension: Secondary | ICD-10-CM

## 2021-08-14 DIAGNOSIS — F1010 Alcohol abuse, uncomplicated: Secondary | ICD-10-CM | POA: Diagnosis not present

## 2021-08-14 DIAGNOSIS — I480 Paroxysmal atrial fibrillation: Secondary | ICD-10-CM | POA: Diagnosis not present

## 2021-08-14 LAB — COMPREHENSIVE METABOLIC PANEL
ALT: 60 U/L — ABNORMAL HIGH (ref 0–44)
AST: 136 U/L — ABNORMAL HIGH (ref 15–41)
Albumin: 3.7 g/dL (ref 3.5–5.0)
Alkaline Phosphatase: 127 U/L — ABNORMAL HIGH (ref 38–126)
Anion gap: 9 (ref 5–15)
BUN: 9 mg/dL (ref 6–20)
CO2: 20 mmol/L — ABNORMAL LOW (ref 22–32)
Calcium: 8.2 mg/dL — ABNORMAL LOW (ref 8.9–10.3)
Chloride: 101 mmol/L (ref 98–111)
Creatinine, Ser: 0.91 mg/dL (ref 0.44–1.00)
GFR, Estimated: >60 mL/min (ref >60)
Glucose, Bld: 151 mg/dL — ABNORMAL HIGH (ref 70–99)
Potassium: 2.9 mmol/L — ABNORMAL LOW (ref 3.5–5.1)
Sodium: 130 mmol/L — ABNORMAL LOW (ref 135–145)
Total Bilirubin: 1.1 mg/dL (ref 0.3–1.2)
Total Protein: 6.4 g/dL — ABNORMAL LOW (ref 6.5–8.1)

## 2021-08-14 LAB — RAPID URINE DRUG SCREEN, HOSP PERFORMED
Amphetamines: NOT DETECTED
Barbiturates: NOT DETECTED
Benzodiazepines: POSITIVE — AB
Cocaine: NOT DETECTED
Opiates: NOT DETECTED
Tetrahydrocannabinol: NOT DETECTED

## 2021-08-14 LAB — MAGNESIUM: Magnesium: 1.7 mg/dL (ref 1.7–2.4)

## 2021-08-14 LAB — VITAMIN D 25 HYDROXY (VIT D DEFICIENCY, FRACTURES): Vit D, 25-Hydroxy: 23.99 ng/mL — ABNORMAL LOW (ref 30–100)

## 2021-08-14 MED ORDER — DEXMEDETOMIDINE HCL IN NACL 400 MCG/100ML IV SOLN
0.2000 ug/kg/h | INTRAVENOUS | Status: DC
  Administered 2021-08-14 – 2021-08-15 (×2): 0.2 ug/kg/h via INTRAVENOUS
  Filled 2021-08-14 (×2): qty 100

## 2021-08-14 MED ORDER — ATENOLOL 25 MG PO TABS
50.0000 mg | ORAL_TABLET | Freq: Every day | ORAL | Status: DC
  Administered 2021-08-14 – 2021-08-18 (×5): 50 mg via ORAL
  Filled 2021-08-14 (×5): qty 2

## 2021-08-14 MED ORDER — PROSOURCE PLUS PO LIQD
30.0000 mL | Freq: Three times a day (TID) | ORAL | Status: DC
  Administered 2021-08-14 – 2021-08-18 (×9): 30 mL via ORAL
  Filled 2021-08-14 (×9): qty 30

## 2021-08-14 MED ORDER — MAGNESIUM SULFATE 2 GM/50ML IV SOLN
2.0000 g | Freq: Once | INTRAVENOUS | Status: AC
  Administered 2021-08-14: 2 g via INTRAVENOUS
  Filled 2021-08-14: qty 50

## 2021-08-14 MED ORDER — POTASSIUM CHLORIDE CRYS ER 20 MEQ PO TBCR
60.0000 meq | EXTENDED_RELEASE_TABLET | Freq: Once | ORAL | Status: AC
  Administered 2021-08-14: 60 meq via ORAL
  Filled 2021-08-14: qty 3

## 2021-08-14 MED ORDER — ENSURE ENLIVE PO LIQD: 237.0000 mL | Freq: Two times a day (BID) | ORAL | Status: DC

## 2021-08-14 NOTE — Progress Notes (Addendum)
Was the fall witnessed: no  Patient condition before and after the fall: patient stated she was ok and not in pain...patient stated that she hit her head on the side of the bed at first then tried to say she didn't. Patient alert and oriented x4.  Patient's reaction to the fall: "I'm ok"  Name of the doctor that was notified including date and time: Dr Wynetta Emery  Any interventions and vital signs: started back on low dose precedex to help patient and due to increasing anxiety and patient statement "I want some wine."

## 2021-08-14 NOTE — Consult Note (Signed)
Cardiology Consultation:   Patient ID: Renee [name]; [id number]; 25-Feb-1966   Admit date: 08/12/2021 Date of Consult: 08/14/2021  Primary Care Provider: Lindell Spar, MD Primary Cardiologist: [facility] to [facility] Primary Electrophysiologist: None   Patient Profile:   Renee [name] is a 55 y.o. female with a history of active alcohol abuse, hypertension, depression, and documented paroxysmal atrial fibrillation who is being seen today for the evaluation of recurrent atrial fibrillation at the request of Dr. Wynetta Emery.  History of Present Illness:   [name] is currently admitted to the hospital in the setting of recent heavy alcohol intake.  Also reporting recent diarrhea and poor nutritional intake.  She was found to have evidence of alcoholic ketoacidosis, also withdrawal, and found to be in rapid atrial fibrillation which she had reportedly experienced 5 weeks earlier at outside facility in similar situation.  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  CHA2DS2-VASc score is 3 (female, hypertension, aortic atherosclerosis by CT imaging).  I reviewed her outpatient medications, not clear that she has been compliant with therapy.  Echocardiogram obtained during this admission shows LVEF 60 to 65%, normal left atrial chamber size.  She was placed on intravenous diltiazem by primary team and has spontaneously converted to sinus rhythm.  Past Medical History:  Diagnosis Date   Alcohol abuse    Depression    Hypertension    PAF (paroxysmal atrial fibrillation) ([place])     Past Surgical History:  Procedure Laterality Date   ABDOMINAL HYSTERECTOMY N/A    CHOLECYSTECTOMY     COLONOSCOPY N/A 01/26/2021   Procedure: COLONOSCOPY;  Surgeon: Rogene Houston, MD;  Location: [facility];  Service: Endoscopy;  Laterality: N/A;  AM   SMALL INTESTINE SURGERY N/A    TONSILLECTOMY       Inpatient  Medications: Scheduled Meds:  atenolol  75 mg Oral Daily   chlordiazePOXIDE  25 mg Oral TID   Chlorhexidine Gluconate Cloth  6 each Topical Daily   enoxaparin (LOVENOX) injection  60 mg Subcutaneous R15Q   folic acid  1 mg Oral Daily   LORazepam  0-4 mg Intravenous Q6H   Followed by   Derrill Memo ON 08/15/2021] LORazepam  0-4 mg Intravenous Q12H   multivitamin with minerals  1 tablet Oral Daily   pantoprazole  40 mg Oral Daily   PARoxetine  20 mg Oral Daily   thiamine  100 mg Oral Daily   Or   thiamine  100 mg Intravenous Daily   Continuous Infusions:  dexmedetomidine (PRECEDEX) IV infusion 0.2 mcg/kg/hr (08/14/21 0808)   dextrose 5 % and 0.9% NaCl 100 mL/hr at 08/14/21 0808   diltiazem (CARDIZEM) infusion Stopped (08/13/21 1428)   PRN Meds: acetaminophen **OR** acetaminophen, LORazepam **OR** LORazepam, ondansetron **OR** ondansetron (ZOFRAN) IV, polyethylene glycol  Allergies:   Not on File  Social History:   Social History   Tobacco Use   Smoking status: Former    Types: Cigarettes    Quit date: 2018    Years since quitting: 4.7   Smokeless tobacco: Never   Tobacco comments:    35 pack-year  Substance Use Topics   Alcohol use: Not Currently     Family History:   The patient's family history includes Hypertension in her father.  ROS:  Please see the history of present illness.  All other ROS reviewed and negative.     Physical Exam/Data:  Vitals:   08/14/21 0300 08/14/21 0400 08/14/21 0500 08/14/21 0600  BP: 128/73 112/69 (!) 97/47 98/61  Pulse:  79 74 78  Resp: (!) 22 (!) 22 (!) 24 (!) 23  Temp:  99.2 F (37.3 C)    TempSrc:  Axillary    SpO2:  94% 95% 95%  Weight:      Height:        Intake/Output Summary (Last 24 hours) at 08/14/2021 0943 Last data filed at 08/14/2021 2094 Gross per 24 hour  Intake 2302.21 ml  Output --  Net 2302.21 ml   Filed Weights   08/12/21 1401 08/13/21 0310  Weight: 117.9 kg 113.1 kg   Body mass index is 40.24 kg/m.    Gen: Patient appears comfortable at rest. HEENT: Conjunctiva and lids normal, oropharynx clear. Neck: Supple, no elevated JVP or carotid bruits, no thyromegaly. Lungs: Clear to auscultation, nonlabored breathing at rest. Cardiac: Regular rate and rhythm, no S3 or significant systolic murmur, no pericardial rub. Abdomen: Soft, nontender, no hepatomegaly, bowel sounds present, no guarding or rebound. Extremities: No pitting edema, distal pulses 2+. Skin: Warm and dry. Musculoskeletal: No kyphosis. Neuropsychiatric: Somnolent this morning.  EKG:  An ECG dated 08/12/2021 was personally reviewed today and demonstrated:  Rapid atrial fibrillation versus atypical atrial flutter.  Telemetry:  I personally reviewed telemetry which shows normal sinus rhythm.  Laboratory Data:  Chemistry Recent Labs  Lab 08/12/21 1537 08/13/21 0401 08/14/21 0449  NA 120* 127* 130*  K 4.1 3.6 2.9*  CL 89* 96* 101  CO2 11* 15* 20*  GLUCOSE 74 116* 151*  BUN 20 14 9   CREATININE 1.16* 0.99 0.91  CALCIUM 7.9* 8.2* 8.2*  GFRNONAA 56* >60 >60  ANIONGAP 20* 16* 9    Recent Labs  Lab 08/12/21 1537 08/13/21 0401 08/14/21 0449  PROT 7.4 7.3 6.4*  ALBUMIN 4.2 4.1 3.7  AST 192* 133* 136*  ALT 70* 63* 60*  ALKPHOS 172* 162* 127*  BILITOT 1.3* 1.9* 1.1   Hematology Recent Labs  Lab 08/09/21 1830 08/12/21 1537 08/13/21 0401  WBC 11.1* 11.6* 11.1*  RBC 4.02 3.95 3.84*  HGB 13.6 13.4 12.9  HCT 39.5 38.7 37.8  MCV 98.3 98.0 98.4  MCH 33.8 33.9 33.6  MCHC 34.4 34.6 34.1  RDW 12.4 12.4 12.5  PLT 347 244 219   Cardiac Enzymes Recent Labs  Lab 08/09/21 1830 08/09/21 2109 08/12/21 1537 08/12/21 2150  TROPONINIHS 3 2 4 8    BNP Recent Labs  Lab 08/12/21 1537  BNP 22.0     Radiology/Studies:  DG Chest Port 1 View  Result Date: 08/12/2021 CLINICAL DATA:  Chest pain and shortness of breath. EXAM: PORTABLE CHEST 1 VIEW COMPARISON:  Chest radiograph August 09, 2021. FINDINGS: Monitoring  leads overlie the patient. Stable cardiac and mediastinal contours. No large area pulmonary consolidation. No pleural effusion or pneumothorax. Thoracic spine degenerative changes. Chronic right rib fractures. IMPRESSION: No active disease. Electronically Signed   By: Lovey Newcomer M.D.   On: 08/12/2021 16:45   ECHOCARDIOGRAM COMPLETE  Result Date: 08/13/2021    ECHOCARDIOGRAM REPORT   Patient Name:   ROSS HEFFERAN Date of Exam: 08/13/2021 Medical Rec #:  [id number]       Height:       66.0 in Accession #:    [id number]      Weight:       249.3 lb Date of Birth:  1966-10-15        BSA:  2.196 m Patient Age:    30 years        BP:           120/86 mmHg Patient Gender: F               HR:           86 bpm. Exam Location:  [facility] Procedure: 2D Echo, Cardiac Doppler and Color Doppler Indications:    Atrial Fibrillation I48.91  History:        Patient has no prior history of Echocardiogram examinations.                 Risk Factors:Hypertension. Alcohol abuse with intoxication                 ([place]), Alcohol withdrawal ([place]). Tobacco abuse.  Sonographer:    Alvino Chapel RCS Referring Phys: [phone] Leanne Chang [facility]  Sonographer Comments: Suboptimal subcostal window. IMPRESSIONS  1. Left ventricular ejection fraction, by estimation, is 60 to 65%. The left ventricle has normal function. The left ventricle has no regional wall motion abnormalities. Left ventricular diastolic parameters were normal.  2. Right ventricular systolic function is normal. The right ventricular size is normal. Tricuspid regurgitation signal is inadequate for assessing PA pressure.  3. The mitral valve is grossly normal. Trivial mitral valve regurgitation.  4. The aortic valve is tricuspid. Aortic valve regurgitation is not visualized.  5. Unable to estimate CVP.  6. Intermittent bowing of atrial septum from right to left without obvious shunt. Comparison(s): No prior Echocardiogram. FINDINGS  Left Ventricle: Left ventricular ejection  fraction, by estimation, is 60 to 65%. The left ventricle has normal function. The left ventricle has no regional wall motion abnormalities. The left ventricular internal cavity size was normal in size. There is  borderline left ventricular hypertrophy. Left ventricular diastolic parameters were normal. Right Ventricle: The right ventricular size is normal. No increase in right ventricular wall thickness. Right ventricular systolic function is normal. Tricuspid regurgitation signal is inadequate for assessing PA pressure. Left Atrium: Left atrial size was normal in size. Right Atrium: Right atrial size was normal in size. Pericardium: There is no evidence of pericardial effusion. Mitral Valve: The mitral valve is grossly normal. Trivial mitral valve regurgitation. Tricuspid Valve: The tricuspid valve is grossly normal. Tricuspid valve regurgitation is trivial. Aortic Valve: The aortic valve is tricuspid. Aortic valve regurgitation is not visualized. Pulmonic Valve: The pulmonic valve was grossly normal. Pulmonic valve regurgitation is trivial. Aorta: The aortic root is normal in size and structure. Venous: Unable to estimate CVP. The inferior vena cava was not well visualized. IAS/Shunts: No atrial level shunt detected by color flow Doppler.  LEFT VENTRICLE PLAX 2D LVIDd:         5.00 cm  Diastology LVIDs:         3.10 cm  LV e' medial:    7.29 cm/s LV PW:         1.10 cm  LV E/e' medial:  9.7 LV IVS:        0.90 cm  LV e' lateral:   13.10 cm/s LVOT diam:     1.90 cm  LV E/e' lateral: 5.4 LV SV:         72 LV SV Index:   33 LVOT Area:     2.84 cm  RIGHT VENTRICLE RV S prime:     12.10 cm/s TAPSE (M-mode): 1.8 cm LEFT ATRIUM  Index       RIGHT ATRIUM           Index LA diam:        3.00 cm 1.37 cm/m  RA Area:     16.10 cm LA Vol (A2C):   65.3 ml 29.73 ml/m RA Volume:   39.50 ml  17.99 ml/m LA Vol (A4C):   56.0 ml 25.50 ml/m LA Biplane Vol: 63.0 ml 28.68 ml/m  AORTIC VALVE LVOT Vmax:   132.00 cm/s  LVOT Vmean:  82.800 cm/s LVOT VTI:    0.253 m  AORTA Ao Root diam: 3.60 cm MITRAL VALVE MV Area (PHT): 3.37 cm    SHUNTS MV Decel Time: 225 msec    Systemic VTI:  0.25 m MV E velocity: 70.40 cm/s  Systemic Diam: 1.90 cm MV A velocity: 46.20 cm/s MV E/A ratio:  1.52 Rozann Lesches MD Electronically signed by Rozann Lesches MD Signature Date/Time: 08/13/2021/11:22:04 AM    Final     Assessment and Plan:   1.  Paroxysmal atrial fibrillation documented in the setting of active alcohol abuse.  CHA2DS2-VASc score is 3.  She has spontaneously converted to sinus rhythm on intravenous diltiazem.  Echocardiogram shows normal LVEF at 60 to 65% with normal left atrial chamber size.  TSH normal.  Cardiac enzymes also normal.  2.  Hyponatremia and hypokalemia.  3.  History of essential hypertension.  Listed outpatient regimen included atenolol, chlorthalidone, Lotensin, and clonidine.  Not clear that she was on potassium supplement.  4.  Active alcohol abuse.  Agree with discontinuation of intravenous diltiazem.  Would resume atenolol at prior dose, given current blood pressure would hold off on the remainder of her antihypertensive therapy for now.  Replete electrolytes and continue with CIWA protocol.  She is not a candidate for anticoagulation as yet, needs to address alcohol cessation with rehabilitation and establish regular follow-up prior to this.  Signed, Rozann Lesches, MD  08/14/2021 9:43 AM

## 2021-08-14 NOTE — Progress Notes (Signed)
PROGRESS NOTE    Renee [name]  WSF:[id number] DOB: May 23, 1966 DOA: 08/12/2021 PCP: Lindell Spar, MD  Brief Narrative: Renee [name] is a 55 year old female who presents with recent heavy drinking and seeking medical assistance in quitting. Hx of obesity, htn, depression, and consuming several bottles of wine daily. She is currently agreeable, anxious, reports visual hallucinations.     Assessment & Plan:   Principal Problem:   Hyponatremia secondary to chronic alcohol abuse -initial sodium 120  on 10/1. 10/2 127. 10/3, 130. -repletion in progress  Active Problems:   Alcohol abuse with intoxication -ciwa protocol in place -medical clearance then to [facility] -folic acid, thiamine, and multivitamin    Hypokalemia -initial 10/1 4.1, 10/3 2.9 with potassium supplementation ordered    Alcohol withdrawal -ciwa protocol in place -librium tid -ativan 1-4 mg po prn -precedex available    Alcoholic ketoacidosis -initial anion gap acidosis at 20, resolved    Atrial fibrillation with rapid ventricular response -initially in atrial fib, was placed on diltiazem with conversion to sinus rhythm. -currently sinus rhythm, atenolol - currently not on anticoagulants due to alcohol use, risk of fall and head trauma. -CHA2DS2-VASc score is 3    Transaminitis -elevated ast 10/1 192, 10/2 133, 10/3 136 -elevated alt  10/1 70,   10/2  63,   10/3 60 -secondary to etoh abuse    Depression -on home paroxetine 20mg  qd      DVT prophylaxis: lovenox Code Status: full Family Communication: husband in room Disposition Plan:   Consultants:  cardiology  Procedures: echocardiogram   Subjective: Patient is anxious, reports visual hallucinations, having several bowel movements daily.  Objective: Vitals:   08/14/21 1250 08/14/21 1400 08/14/21 1500 08/14/21 1704  BP: 140/75 134/90 122/82   Pulse: 75 74 73 76  Resp:  (!) 23 16 (!) 22  Temp:    98 F (36.7 C)  TempSrc:     Oral  SpO2:  99% 93% 92%  Weight:      Height:        Intake/Output Summary (Last 24 hours) at 08/14/2021 1724 Last data filed at 08/14/2021 1512 Gross per 24 hour  Intake 2518.89 ml  Output --  Net 2518.89 ml   Filed Weights   08/12/21 1401 08/13/21 0310  Weight: 117.9 kg 113.1 kg    Examination:  General exam: Appears anxious, agreeable, has difficulty finding a comfortable position, fidgits  Respiratory system: Clear to auscultation. Respiratory effort normal.  Cardiovascular system: S1 & S2 heard, RRR. No JVD, murmurs, rubs, gallops or clicks. No pedal edema.  Gastrointestinal system: Abdomen is nondistended, soft and nontender. No organomegaly or masses felt. Hyperactive bowel sounds heard  Central nervous system: Alert and oriented. No focal neurological deficits.  Extremities: Symmetric 5 x 5 power.  Skin: No obvious rashes, lesions or ulcers  Psychiatry: Judgement and insight appear normal. Mood is worried, but can be comforted.    Data Reviewed: I have personally reviewed following labs and imaging studies  CBC: Recent Labs  Lab 08/09/21 1830 08/12/21 1537 08/13/21 0401  WBC 11.1* 11.6* 11.1*  NEUTROABS 6.5 8.8*  --   HGB 13.6 13.4 12.9  HCT 39.5 38.7 37.8  MCV 98.3 98.0 98.4  PLT 347 244 017   Basic Metabolic Panel: Recent Labs  Lab 08/09/21 1830 08/12/21 1537 08/13/21 0401 08/14/21 0449  NA 129* 120* 127* 130*  K 4.1 4.1 3.6 2.9*  CL 97* 89* 96* 101  CO2 19* 11* 15*  20*  GLUCOSE 94 74 116* 151*  BUN 14 20 14 9   CREATININE 0.77 1.16* 0.99 0.91  CALCIUM 8.2* 7.9* 8.2* 8.2*  MG  --  1.6*  --  1.7  PHOS  --  2.8  --   --    GFR: Estimated Creatinine Clearance: 91.2 mL/min (by C-G formula based on SCr of 0.91 mg/dL). Liver Function Tests: Recent Labs  Lab 08/09/21 1830 08/12/21 1537 08/13/21 0401 08/14/21 0449  AST 53* 192* 133* 136*  ALT 34 70* 63* 60*  ALKPHOS 114 172* 162* 127*  BILITOT 0.4 1.3* 1.9* 1.1  PROT 7.4 7.4 7.3 6.4*   ALBUMIN 4.2 4.2 4.1 3.7   No results for input(s): LIPASE, AMYLASE in the last 168 hours. No results for input(s): AMMONIA in the last 168 hours. Coagulation Profile: No results for input(s): INR, PROTIME in the last 168 hours. Cardiac Enzymes: No results for input(s): CKTOTAL, CKMB, CKMBINDEX, TROPONINI in the last 168 hours. BNP (last 3 results) No results for input(s): PROBNP in the last 8760 hours. HbA1C: No results for input(s): HGBA1C in the last 72 hours. CBG: Recent Labs  Lab 08/12/21 2232  GLUCAP 80   Lipid Profile: No results for input(s): CHOL, HDL, LDLCALC, TRIG, CHOLHDL, LDLDIRECT in the last 72 hours. Thyroid Function Tests: Recent Labs    08/13/21 0401  TSH 2.029   Anemia Panel: No results for input(s): VITAMINB12, FOLATE, FERRITIN, TIBC, IRON, RETICCTPCT in the last 72 hours. Urine analysis:    Component Value Date/Time   COLORURINE YELLOW 03/15/2021 1128   APPEARANCEUR CLEAR 03/15/2021 1128   LABSPEC 1.014 03/15/2021 1128   PHURINE 5.0 03/15/2021 1128   GLUCOSEU NEGATIVE 03/15/2021 1128   HGBUR SMALL (A) 03/15/2021 1128   [place] 03/15/2021 [place] 03/15/2021 [place] 03/15/2021 1128   NITRITE NEGATIVE 03/15/2021 [place] 03/15/2021 1128   Sepsis Labs: @LABRCNTIP (procalcitonin:4,lacticidven:4)  ) Recent Results (from the past 240 hour(s))  SARS CORONAVIRUS 2 (TAT 6-24 HRS) Nasopharyngeal Nasopharyngeal Swab     Status: None   Collection Time: 08/12/21  8:35 PM   Specimen: Nasopharyngeal Swab  Result Value Ref Range Status   SARS Coronavirus 2 NEGATIVE NEGATIVE Final    Comment: (NOTE) SARS-CoV-2 target nucleic acids are NOT DETECTED.  The SARS-CoV-2 RNA is generally detectable in upper and lower respiratory specimens during the acute phase of infection. Negative results do not preclude SARS-CoV-2 infection, do not rule out co-infections with other pathogens, and should not  be used as the sole basis for treatment or other patient management decisions. Negative results must be combined with clinical observations, patient history, and epidemiological information. The expected result is Negative.  Fact Sheet for Patients: SugarRoll.be  Fact Sheet for Healthcare Providers: https://www.woods-mathews.com/  This test is not yet approved or cleared by the [place] FDA and  has been authorized for detection and/or diagnosis of SARS-CoV-2 by FDA under an Emergency Use Authorization (EUA). This EUA will remain  in effect (meaning this test can be used) for the duration of the COVID-19 declaration under Se ction 564(b)(1) of the Act, 21 U.S.C. section 360bbb-3(b)(1), unless the authorization is terminated or revoked sooner.  Performed at [facility], [place] [address], [place], [place] [address]   Resp Panel by RT-PCR (Flu A&B, Covid) Nasopharyngeal Swab     Status: None   Collection Time: 08/13/21  1:25 AM   Specimen: Nasopharyngeal Swab; Nasopharyngeal(NP) swabs in vial transport  medium  Result Value Ref Range Status   SARS Coronavirus 2 by RT PCR NEGATIVE NEGATIVE Final    Comment: (NOTE) SARS-CoV-2 target nucleic acids are NOT DETECTED.  The SARS-CoV-2 RNA is generally detectable in upper respiratory specimens during the acute phase of infection. The lowest concentration of SARS-CoV-2 viral copies this assay can detect is 138 copies/mL. A negative result does not preclude SARS-Cov-2 infection and should not be used as the sole basis for treatment or other patient management decisions. A negative result may occur with  improper specimen collection/handling, submission of specimen other than nasopharyngeal swab, presence of viral mutation(s) within the areas targeted by this assay, and inadequate number of viral copies(<138 copies/mL). A negative result must be combined with clinical observations, patient  history, and epidemiological information. The expected result is Negative.  Fact Sheet for Patients:  EntrepreneurPulse.com.au  Fact Sheet for Healthcare Providers:  IncredibleEmployment.be  This test is no t yet approved or cleared by the [place] FDA and  has been authorized for detection and/or diagnosis of SARS-CoV-2 by FDA under an Emergency Use Authorization (EUA). This EUA will remain  in effect (meaning this test can be used) for the duration of the COVID-19 declaration under Section 564(b)(1) of the Act, 21 U.S.C.section 360bbb-3(b)(1), unless the authorization is terminated  or revoked sooner.       Influenza A by PCR NEGATIVE NEGATIVE Final   Influenza B by PCR NEGATIVE NEGATIVE Final    Comment: (NOTE) The Xpert Xpress SARS-CoV-2/FLU/RSV plus assay is intended as an aid in the diagnosis of influenza from Nasopharyngeal swab specimens and should not be used as a sole basis for treatment. Nasal washings and aspirates are unacceptable for Xpert Xpress SARS-CoV-2/FLU/RSV testing.  Fact Sheet for Patients: EntrepreneurPulse.com.au  Fact Sheet for Healthcare Providers: IncredibleEmployment.be  This test is not yet approved or cleared by the [place] FDA and has been authorized for detection and/or diagnosis of SARS-CoV-2 by FDA under an Emergency Use Authorization (EUA). This EUA will remain in effect (meaning this test can be used) for the duration of the COVID-19 declaration under Section 564(b)(1) of the Act, 21 U.S.C. section 360bbb-3(b)(1), unless the authorization is terminated or revoked.  Performed at [facility], [address] [place], [place] [address]   MRSA Next Gen by PCR, Nasal     Status: None   Collection Time: 08/13/21  9:00 AM   Specimen: Nasal Mucosa; Nasal Swab  Result Value Ref Range Status   MRSA by PCR Next Gen NOT DETECTED NOT DETECTED Final    Comment:  (NOTE) The GeneXpert MRSA Assay (FDA approved for NASAL specimens only), is one component of a comprehensive MRSA colonization surveillance program. It is not intended to diagnose MRSA infection nor to guide or monitor treatment for MRSA infections. Test performance is not FDA approved in patients less than 26 years old. Performed at [facility], [address], [place], [place] [address]          Radiology Studies: ECHOCARDIOGRAM COMPLETE  Result Date: 08/13/2021    ECHOCARDIOGRAM REPORT   Patient Name:   EMELLY WURTZ Date of Exam: 08/13/2021 Medical Rec #:  [id number]       Height:       66.0 in Accession #:    [id number]      Weight:       249.3 lb Date of Birth:  09/01/1966        BSA:  2.196 m Patient Age:    51 years        BP:           120/86 mmHg Patient Gender: F               HR:           86 bpm. Exam Location:  [facility] Procedure: 2D Echo, Cardiac Doppler and Color Doppler Indications:    Atrial Fibrillation I48.91  History:        Patient has no prior history of Echocardiogram examinations.                 Risk Factors:Hypertension. Alcohol abuse with intoxication                 ([place]), Alcohol withdrawal ([place]). Tobacco abuse.  Sonographer:    Alvino Chapel RCS Referring Phys: [phone] Leanne Chang [facility]  Sonographer Comments: Suboptimal subcostal window. IMPRESSIONS  1. Left ventricular ejection fraction, by estimation, is 60 to 65%. The left ventricle has normal function. The left ventricle has no regional wall motion abnormalities. Left ventricular diastolic parameters were normal.  2. Right ventricular systolic function is normal. The right ventricular size is normal. Tricuspid regurgitation signal is inadequate for assessing PA pressure.  3. The mitral valve is grossly normal. Trivial mitral valve regurgitation.  4. The aortic valve is tricuspid. Aortic valve regurgitation is not visualized.  5. Unable to estimate CVP.  6. Intermittent bowing of atrial septum from right to  left without obvious shunt. Comparison(s): No prior Echocardiogram. FINDINGS  Left Ventricle: Left ventricular ejection fraction, by estimation, is 60 to 65%. The left ventricle has normal function. The left ventricle has no regional wall motion abnormalities. The left ventricular internal cavity size was normal in size. There is  borderline left ventricular hypertrophy. Left ventricular diastolic parameters were normal. Right Ventricle: The right ventricular size is normal. No increase in right ventricular wall thickness. Right ventricular systolic function is normal. Tricuspid regurgitation signal is inadequate for assessing PA pressure. Left Atrium: Left atrial size was normal in size. Right Atrium: Right atrial size was normal in size. Pericardium: There is no evidence of pericardial effusion. Mitral Valve: The mitral valve is grossly normal. Trivial mitral valve regurgitation. Tricuspid Valve: The tricuspid valve is grossly normal. Tricuspid valve regurgitation is trivial. Aortic Valve: The aortic valve is tricuspid. Aortic valve regurgitation is not visualized. Pulmonic Valve: The pulmonic valve was grossly normal. Pulmonic valve regurgitation is trivial. Aorta: The aortic root is normal in size and structure. Venous: Unable to estimate CVP. The inferior vena cava was not well visualized. IAS/Shunts: No atrial level shunt detected by color flow Doppler.  LEFT VENTRICLE PLAX 2D LVIDd:         5.00 cm  Diastology LVIDs:         3.10 cm  LV e' medial:    7.29 cm/s LV PW:         1.10 cm  LV E/e' medial:  9.7 LV IVS:        0.90 cm  LV e' lateral:   13.10 cm/s LVOT diam:     1.90 cm  LV E/e' lateral: 5.4 LV SV:         72 LV SV Index:   33 LVOT Area:     2.84 cm  RIGHT VENTRICLE RV S prime:     12.10 cm/s TAPSE (M-mode): 1.8 cm LEFT ATRIUM  Index       RIGHT ATRIUM           Index LA diam:        3.00 cm 1.37 cm/m  RA Area:     16.10 cm LA Vol (A2C):   65.3 ml 29.73 ml/m RA Volume:   39.50 ml  17.99  ml/m LA Vol (A4C):   56.0 ml 25.50 ml/m LA Biplane Vol: 63.0 ml 28.68 ml/m  AORTIC VALVE LVOT Vmax:   132.00 cm/s LVOT Vmean:  82.800 cm/s LVOT VTI:    0.253 m  AORTA Ao Root diam: 3.60 cm MITRAL VALVE MV Area (PHT): 3.37 cm    SHUNTS MV Decel Time: 225 msec    Systemic VTI:  0.25 m MV E velocity: 70.40 cm/s  Systemic Diam: 1.90 cm MV A velocity: 46.20 cm/s MV E/A ratio:  1.52 Rozann Lesches MD Electronically signed by Rozann Lesches MD Signature Date/Time: 08/13/2021/11:22:04 AM    Final         Scheduled Meds:  (feeding supplement) PROSource Plus  30 mL Oral TID BM   atenolol  50 mg Oral Daily   chlordiazePOXIDE  25 mg Oral TID   Chlorhexidine Gluconate Cloth  6 each Topical Daily   enoxaparin (LOVENOX) injection  60 mg Subcutaneous Q24H   [START ON 08/15/2021] feeding supplement  237 mL Oral BID BM   folic acid  1 mg Oral Daily   LORazepam  0-4 mg Intravenous Q6H   Followed by   Derrill Memo ON 08/15/2021] LORazepam  0-4 mg Intravenous Q12H   multivitamin with minerals  1 tablet Oral Daily   pantoprazole  40 mg Oral Daily   PARoxetine  20 mg Oral Daily   thiamine  100 mg Oral Daily   Or   thiamine  100 mg Intravenous Daily   Continuous Infusions:  dexmedetomidine (PRECEDEX) IV infusion Stopped (08/14/21 1116)   dextrose 5 % and 0.9% NaCl 100 mL/hr at 08/14/21 1520     LOS: 2 days     Ellie Lunch, student [facility] Pager [phone]  If 7PM-7AM, please contact night-coverage [email or web address] Password [facility] 08/14/2021, 5:24 PM

## 2021-08-14 NOTE — Progress Notes (Signed)
Initial Nutrition Assessment  DOCUMENTATION CODES:   Morbid obesity  INTERVENTION:  Ensure Plus High Protein po BID, each supplement provides 350 kcal and 20 grams of protein.   ProSource Plus 30 ml TID (each 30 ml provides 100 kcal, 15 gr protein)   NUTRITION DIAGNOSIS:   Inadequate oral intake related to social / environmental circumstances (ETOH withdrawl) as evidenced by meal completion < 50%.   GOAL:  Provide needs based on ASPEN/SCCM guidelines   MONITOR:  PO intake, Supplement acceptance, Labs, Weight trends  REASON FOR ASSESSMENT:   Malnutrition Screening Tool    ASSESSMENT: Patient is a 55 yo female with history of depression, hypertension and Alcohol abuse. Presents with reported chest pain, shortness of breath and anxiety.  Patient is awake and says she feels anxious. Denies intake today. At home eating pattern is once daily and snacks between. Talked about favorite foods. Nutrition services updated. Will add high protein ONS to support intake.   No significant changes in weight noted. Currently 113.1 kg and 8//22 pt wt 116.3 kg a minimal loss of 2.8% August to October.   Labs: BMP Latest Ref Rng & Units 08/14/2021 08/13/2021 08/12/2021  Glucose 70 - 99 mg/dL 151(H) 116(H) 74  BUN 6 - 20 mg/dL 9 14 20   Creatinine 0.44 - 1.00 mg/dL 0.91 0.99 1.16(H)  BUN/Creat Ratio 9 - 23 - - -  Sodium 135 - 145 mmol/L 130(L) 127(L) 120(L)  Potassium 3.5 - 5.1 mmol/L 2.9(L) 3.6 4.1  Chloride 98 - 111 mmol/L 101 96(L) 89(L)  CO2 22 - 32 mmol/L 20(L) 15(L) 11(L)  Calcium 8.9 - 10.3 mg/dL 8.2(L) 8.2(L) 7.9(L)     Medications reviewed and include: folvite, librium, MVI, Thiamine  IVF-D5% NS @ 100 ml/hr Drips: Precedex   NUTRITION - FOCUSED PHYSICAL EXAM:  Nutrition-Focused physical exam completed. Findings are no fat depletion, no muscle depletion, and mild edema.    Diet Order:   Diet Order             Diet Heart Room service appropriate? Yes; Fluid consistency: Thin   Diet effective now                   EDUCATION NEEDS:  Not appropriate for education at this time  Skin:  Skin Assessment: Reviewed RN Assessment  Last BM:  10/2 type 6  Height:   Ht Readings from Last 1 Encounters:  08/13/21 5\' 6"  (1.676 m)    Weight:   Wt Readings from Last 1 Encounters:  08/13/21 113.1 kg    Ideal Body Weight:   65 kg  BMI:  Body mass index is 40.24 kg/m.  Estimated Nutritional Needs:   Kcal:  8341-9622  Protein:  130-143 gr  Fluid:  >1500 ml daily   Colman Cater MS,RD,CSG,LDN Contact: Shea Evans

## 2021-08-14 NOTE — Significant Event (Signed)
08/14/2021 6:42 PM  RN sent communication to MD.  "patient fell on floor...was put on bedside commode and did not wait for staff to try to get back to bed... states hit head on bed....but not hurting anywhere per patient"   Pt is going thru alcohol withdrawal and is heavily medicated at this time and deemed poor historian at this time.  Will check CT head to rule out injury.  Not sure how hard she hit head.  Will follow up CT results.    Murvin Natal, MD

## 2021-08-15 DIAGNOSIS — I480 Paroxysmal atrial fibrillation: Secondary | ICD-10-CM | POA: Diagnosis not present

## 2021-08-15 LAB — CBC WITH DIFFERENTIAL/PLATELET
Abs Immature Granulocytes: 0.01 10*3/uL (ref 0.00–0.07)
Basophils Absolute: 0 10*3/uL (ref 0.0–0.1)
Basophils Relative: 1 %
Eosinophils Absolute: 0.3 10*3/uL (ref 0.0–0.5)
Eosinophils Relative: 9 %
HCT: 32.8 % — ABNORMAL LOW (ref 36.0–46.0)
Hemoglobin: 10.6 g/dL — ABNORMAL LOW (ref 12.0–15.0)
Immature Granulocytes: 0 %
Lymphocytes Relative: 29 %
Lymphs Abs: 1.1 10*3/uL (ref 0.7–4.0)
MCH: 33.8 pg (ref 26.0–34.0)
MCHC: 32.3 g/dL (ref 30.0–36.0)
MCV: 104.5 fL — ABNORMAL HIGH (ref 80.0–100.0)
Monocytes Absolute: 0.4 10*3/uL (ref 0.1–1.0)
Monocytes Relative: 11 %
Neutro Abs: 1.9 10*3/uL (ref 1.7–7.7)
Neutrophils Relative %: 50 %
Platelets: 136 10*3/uL — ABNORMAL LOW (ref 150–400)
RBC: 3.14 MIL/uL — ABNORMAL LOW (ref 3.87–5.11)
RDW: 12.8 % (ref 11.5–15.5)
WBC: 3.7 10*3/uL — ABNORMAL LOW (ref 4.0–10.5)
nRBC: 0 % (ref 0.0–0.2)

## 2021-08-15 LAB — COMPREHENSIVE METABOLIC PANEL
ALT: 70 U/L — ABNORMAL HIGH (ref 0–44)
AST: 138 U/L — ABNORMAL HIGH (ref 15–41)
Albumin: 3.3 g/dL — ABNORMAL LOW (ref 3.5–5.0)
Alkaline Phosphatase: 113 U/L (ref 38–126)
Anion gap: 8 (ref 5–15)
BUN: 5 mg/dL — ABNORMAL LOW (ref 6–20)
CO2: 21 mmol/L — ABNORMAL LOW (ref 22–32)
Calcium: 8.3 mg/dL — ABNORMAL LOW (ref 8.9–10.3)
Chloride: 104 mmol/L (ref 98–111)
Creatinine, Ser: 0.79 mg/dL (ref 0.44–1.00)
GFR, Estimated: >60 mL/min (ref >60)
Glucose, Bld: 129 mg/dL — ABNORMAL HIGH (ref 70–99)
Potassium: 3.4 mmol/L — ABNORMAL LOW (ref 3.5–5.1)
Sodium: 133 mmol/L — ABNORMAL LOW (ref 135–145)
Total Bilirubin: 1.1 mg/dL (ref 0.3–1.2)
Total Protein: 6 g/dL — ABNORMAL LOW (ref 6.5–8.1)

## 2021-08-15 LAB — MAGNESIUM: Magnesium: 1.6 mg/dL — ABNORMAL LOW (ref 1.7–2.4)

## 2021-08-15 MED ORDER — MAGNESIUM SULFATE 4 GM/100ML IV SOLN
4.0000 g | Freq: Once | INTRAVENOUS | Status: AC
  Administered 2021-08-15: 4 g via INTRAVENOUS
  Filled 2021-08-15: qty 100

## 2021-08-15 MED ORDER — POTASSIUM CHLORIDE CRYS ER 20 MEQ PO TBCR
40.0000 meq | EXTENDED_RELEASE_TABLET | Freq: Once | ORAL | Status: AC
  Administered 2021-08-15: 40 meq via ORAL
  Filled 2021-08-15: qty 2

## 2021-08-15 MED ORDER — HALOPERIDOL LACTATE 5 MG/ML IJ SOLN
5.0000 mg | Freq: Once | INTRAMUSCULAR | Status: AC
  Administered 2021-08-15: 5 mg via INTRAVENOUS
  Filled 2021-08-15: qty 1

## 2021-08-15 NOTE — Progress Notes (Signed)
PROGRESS NOTE    Renee [name]  YIF:[id number] DOB: 1965/11/21 DOA: 08/12/2021 PCP: Lindell Spar, MD    Brief Narrative: Renee [name] is a 55 year old female who presents with recent heavy drinking and seeking medical assistance in quitting. Hx of obesity, htn, depression, and consuming several bottles of wine daily. She is currently agreeable, anxious, reports visual hallucinations. UDS without unexpected findings. She reports numerous loose stools, no infectious cause suspected, fell while attempting to use the bedside toilet, head ct was without acute findings. She has a hx of afib, not currently anticoagulated due to fall risk and alcohol use. Once she has passed the acute withdrawal phase, the plan is to go to [facility], which she asks about stopping by home first, and I have emphasized that she should go directly there and have her husband bring her clothes.   Assessment & Plan:   Principal Problem:   Hyponatremia secondary to chronic alcohol abuse -initial sodium 120  on 10/1. 10/2 127. 10/3, 130. 10/4 133. -repletion in progress   Active Problems:   Alcohol abuse with intoxication -ciwa protocol in place -medical clearance then to [facility] -folic acid, thiamine, and multivitamin     Alcohol withdrawal -ciwa protocol in place -librium tid -ativan 1-4 mg po prn -precedex available and she is quite sensitive in regards to hypotension     Alcoholic ketoacidosis -initial anion gap acidosis at 20, resolved     Atrial fibrillation with rapid ventricular response -initially in atrial fib, was placed on diltiazem with conversion to sinus rhythm. -currently sinus rhythm, atenolol - currently not on anticoagulants due to alcohol use, risk of fall and head trauma. -CHA2DS2-VASc score is 3 -fall 10/3, possibly struck head, no acute head ct findings     Transaminitis -elevated ast 10/1 192, 10/2 133, 10/3 136   10/4 138 -elevated alt  10/1 70,   10/2  63,   10/3  60    10/4  70    Hypokalemia -initial 10/1 4.1, 10/4 3.4 with potassium supplementation ordered  Macrocytic Anemia -multivitamin, folate, thiamine already given  Pancytopenia -multivitamin, folate, thiamine -monitoring    Depression -on home paroxetine 20mg  qd    Diarrhea -she is having frequent loose bowel movements -non inflammatory, no blood, no mucous -no suspicion for infectious cause or cdiff -attempted to use bedside toilet without assistance, fell, head ct, no acute findings     DVT prophylaxis: lovenox Code Status: full Family Communication: Disposition Plan: [facility] 10/5?   Consultants:  cardiology  Procedures:  Head ct - post fall- no acute findings    Subjective: She is anxious about her next steps, agreeable, displeased with copious dirrhea  Objective: Vitals:   08/15/21 1200 08/15/21 1358 08/15/21 1630 08/15/21 1632  BP: (!) 158/62 (!) 150/48 (!) 154/98   Pulse: 77 85 78   Resp: 18   19  Temp:   98.4 F (36.9 C)   TempSrc:   Oral   SpO2: 96%  98%   Weight:      Height:        Intake/Output Summary (Last 24 hours) at 08/15/2021 1650 Last data filed at 08/15/2021 1543 Gross per 24 hour  Intake 2641.77 ml  Output --  Net 2641.77 ml   Filed Weights   08/12/21 1401 08/13/21 0310  Weight: 117.9 kg 113.1 kg    Examination:  General exam: She is agreeable, anxious, and nervous about the whole process.  Respiratory system: Clear to auscultation.  Respiratory effort normal.  Cardiovascular system: S1 & S2 heard, RRR. No JVD, murmurs, rubs, gallops or clicks. No pedal edema.  Gastrointestinal system: Abdomen is nondistended, soft and nontender. No organomegaly or masses felt. Hyperactive bowel sounds heard.  Central nervous system: Alert and oriented. No focal neurological deficits.  Extremities: she moves all extremities appropriately  Skin: No obvious rashes or lesions  Psychiatry: She is agitated, nervous about her next  step to [facility], makes many comments about more wine.    Data Reviewed: I have personally reviewed following labs and imaging studies  CBC: Recent Labs  Lab 08/09/21 1830 08/12/21 1537 08/13/21 0401 08/15/21 0504  WBC 11.1* 11.6* 11.1* 3.7*  NEUTROABS 6.5 8.8*  --  1.9  HGB 13.6 13.4 12.9 10.6*  HCT 39.5 38.7 37.8 32.8*  MCV 98.3 98.0 98.4 104.5*  PLT 347 244 219 885*   Basic Metabolic Panel: Recent Labs  Lab 08/09/21 1830 08/12/21 1537 08/13/21 0401 08/14/21 0449 08/15/21 0504  NA 129* 120* 127* 130* 133*  K 4.1 4.1 3.6 2.9* 3.4*  CL 97* 89* 96* 101 104  CO2 19* 11* 15* 20* 21*  GLUCOSE 94 74 116* 151* 129*  BUN 14 20 14 9  5*  CREATININE 0.77 1.16* 0.99 0.91 0.79  CALCIUM 8.2* 7.9* 8.2* 8.2* 8.3*  MG  --  1.6*  --  1.7 1.6*  PHOS  --  2.8  --   --   --    GFR: Estimated Creatinine Clearance: 103.7 mL/min (by C-G formula based on SCr of 0.79 mg/dL). Liver Function Tests: Recent Labs  Lab 08/09/21 1830 08/12/21 1537 08/13/21 0401 08/14/21 0449 08/15/21 0504  AST 53* 192* 133* 136* 138*  ALT 34 70* 63* 60* 70*  ALKPHOS 114 172* 162* 127* 113  BILITOT 0.4 1.3* 1.9* 1.1 1.1  PROT 7.4 7.4 7.3 6.4* 6.0*  ALBUMIN 4.2 4.2 4.1 3.7 3.3*   No results for input(s): LIPASE, AMYLASE in the last 168 hours. No results for input(s): AMMONIA in the last 168 hours. Coagulation Profile: No results for input(s): INR, PROTIME in the last 168 hours. Cardiac Enzymes: No results for input(s): CKTOTAL, CKMB, CKMBINDEX, TROPONINI in the last 168 hours. BNP (last 3 results) No results for input(s): PROBNP in the last 8760 hours. HbA1C: No results for input(s): HGBA1C in the last 72 hours. CBG: Recent Labs  Lab 08/12/21 2232  GLUCAP 80   Lipid Profile: No results for input(s): CHOL, HDL, LDLCALC, TRIG, CHOLHDL, LDLDIRECT in the last 72 hours. Thyroid Function Tests: Recent Labs    08/13/21 0401  TSH 2.029   Anemia Panel: No results for input(s): VITAMINB12,  FOLATE, FERRITIN, TIBC, IRON, RETICCTPCT in the last 72 hours. Urine analysis:    Component Value Date/Time   COLORURINE YELLOW 03/15/2021 1128   APPEARANCEUR CLEAR 03/15/2021 1128   LABSPEC 1.014 03/15/2021 1128   PHURINE 5.0 03/15/2021 1128   GLUCOSEU NEGATIVE 03/15/2021 1128   HGBUR SMALL (A) 03/15/2021 1128   BILIRUBINUR NEGATIVE 03/15/2021 1128   [place] 03/15/2021 1128   PROTEINUR NEGATIVE 03/15/2021 1128   NITRITE NEGATIVE 03/15/2021 [place] 03/15/2021 1128   Sepsis Labs: @LABRCNTIP (procalcitonin:4,lacticidven:4)  ) Recent Results (from the past 240 hour(s))  SARS CORONAVIRUS 2 (TAT 6-24 HRS) Nasopharyngeal Nasopharyngeal Swab     Status: None   Collection Time: 08/12/21  8:35 PM   Specimen: Nasopharyngeal Swab  Result Value Ref Range Status   SARS Coronavirus 2 NEGATIVE NEGATIVE Final  Comment: (NOTE) SARS-CoV-2 target nucleic acids are NOT DETECTED.  The SARS-CoV-2 RNA is generally detectable in upper and lower respiratory specimens during the acute phase of infection. Negative results do not preclude SARS-CoV-2 infection, do not rule out co-infections with other pathogens, and should not be used as the sole basis for treatment or other patient management decisions. Negative results must be combined with clinical observations, patient history, and epidemiological information. The expected result is Negative.  Fact Sheet for Patients: SugarRoll.be  Fact Sheet for Healthcare Providers: https://www.woods-mathews.com/  This test is not yet approved or cleared by the [place] FDA and  has been authorized for detection and/or diagnosis of SARS-CoV-2 by FDA under an Emergency Use Authorization (EUA). This EUA will remain  in effect (meaning this test can be used) for the duration of the COVID-19 declaration under Se ction 564(b)(1) of the Act, 21 U.S.C. section 360bbb-3(b)(1), unless the  authorization is terminated or revoked sooner.  Performed at [facility], [place] [address], [place], [place] [address]   Resp Panel by RT-PCR (Flu A&B, Covid) Nasopharyngeal Swab     Status: None   Collection Time: 08/13/21  1:25 AM   Specimen: Nasopharyngeal Swab; Nasopharyngeal(NP) swabs in vial transport medium  Result Value Ref Range Status   SARS Coronavirus 2 by RT PCR NEGATIVE NEGATIVE Final    Comment: (NOTE) SARS-CoV-2 target nucleic acids are NOT DETECTED.  The SARS-CoV-2 RNA is generally detectable in upper respiratory specimens during the acute phase of infection. The lowest concentration of SARS-CoV-2 viral copies this assay can detect is 138 copies/mL. A negative result does not preclude SARS-Cov-2 infection and should not be used as the sole basis for treatment or other patient management decisions. A negative result may occur with  improper specimen collection/handling, submission of specimen other than nasopharyngeal swab, presence of viral mutation(s) within the areas targeted by this assay, and inadequate number of viral copies(<138 copies/mL). A negative result must be combined with clinical observations, patient history, and epidemiological information. The expected result is Negative.  Fact Sheet for Patients:  EntrepreneurPulse.com.au  Fact Sheet for Healthcare Providers:  IncredibleEmployment.be  This test is no t yet approved or cleared by the [place] FDA and  has been authorized for detection and/or diagnosis of SARS-CoV-2 by FDA under an Emergency Use Authorization (EUA). This EUA will remain  in effect (meaning this test can be used) for the duration of the COVID-19 declaration under Section 564(b)(1) of the Act, 21 U.S.C.section 360bbb-3(b)(1), unless the authorization is terminated  or revoked sooner.       Influenza A by PCR NEGATIVE NEGATIVE Final   Influenza B by PCR NEGATIVE NEGATIVE Final     Comment: (NOTE) The Xpert Xpress SARS-CoV-2/FLU/RSV plus assay is intended as an aid in the diagnosis of influenza from Nasopharyngeal swab specimens and should not be used as a sole basis for treatment. Nasal washings and aspirates are unacceptable for Xpert Xpress SARS-CoV-2/FLU/RSV testing.  Fact Sheet for Patients: EntrepreneurPulse.com.au  Fact Sheet for Healthcare Providers: IncredibleEmployment.be  This test is not yet approved or cleared by the [place] FDA and has been authorized for detection and/or diagnosis of SARS-CoV-2 by FDA under an Emergency Use Authorization (EUA). This EUA will remain in effect (meaning this test can be used) for the duration of the COVID-19 declaration under Section 564(b)(1) of the Act, 21 U.S.C. section 360bbb-3(b)(1), unless the authorization is terminated or revoked.  Performed at [facility], [address], [place], [place]  [address]   MRSA Next Gen by PCR, Nasal     Status: None   Collection Time: 08/13/21  9:00 AM   Specimen: Nasal Mucosa; Nasal Swab  Result Value Ref Range Status   MRSA by PCR Next Gen NOT DETECTED NOT DETECTED Final    Comment: (NOTE) The GeneXpert MRSA Assay (FDA approved for NASAL specimens only), is one component of a comprehensive MRSA colonization surveillance program. It is not intended to diagnose MRSA infection nor to guide or monitor treatment for MRSA infections. Test performance is not FDA approved in patients less than 66 years old. Performed at [facility], [address], [place], [place] [address]          Radiology Studies: CT HEAD WO CONTRAST (5MM)  Result Date: 08/14/2021 CLINICAL DATA:  Head trauma, moderate to severe EXAM: CT HEAD WITHOUT CONTRAST TECHNIQUE: Contiguous axial images were obtained from the base of the skull through the vertex without intravenous contrast. COMPARISON:  None. FINDINGS: Brain: No evidence of acute infarction, hemorrhage,  cerebral edema, mass, mass effect, or midline shift. Ventricles and sulci are within normal limits for age. No extra-axial fluid collection. Vascular: No hyperdense vessel or unexpected calcification. Skull: Normal. Negative for fracture or focal lesion. Sinuses/Orbits: No acute finding. Other: The mastoid air cells are well aerated. IMPRESSION: No acute intracranial process. Electronically Signed   By: Merilyn Baba M.D.   On: 08/14/2021 19:58        Scheduled Meds:  (feeding supplement) PROSource Plus  30 mL Oral TID BM   atenolol  50 mg Oral Daily   chlordiazePOXIDE  25 mg Oral TID   Chlorhexidine Gluconate Cloth  6 each Topical Daily   enoxaparin (LOVENOX) injection  60 mg Subcutaneous Q24H   feeding supplement  237 mL Oral BID BM   folic acid  1 mg Oral Daily   LORazepam  0-4 mg Intravenous Q12H   multivitamin with minerals  1 tablet Oral Daily   pantoprazole  40 mg Oral Daily   PARoxetine  20 mg Oral Daily   thiamine  100 mg Oral Daily   Or   thiamine  100 mg Intravenous Daily   Continuous Infusions:  dexmedetomidine (PRECEDEX) IV infusion Stopped (08/15/21 0342)   dextrose 5 % and 0.9% NaCl 100 mL/hr at 08/15/21 1543     LOS: 3 days     Ellie Lunch, student [facility] Pager [phone]  If 7PM-7AM, please contact night-coverage [email or web address] Password [email or web address] 08/15/2021, 4:50 PM

## 2021-08-15 NOTE — TOC Initial Note (Signed)
Transition of Care [facility]) - Initial/Assessment Note    Patient Details  Name: Renee [name] MRN: [id number] Date of Birth: 1965/11/27  Transition of Care [facility]) CM/SW Contact:    Boneta Lucks, RN Phone Number: 08/15/2021, 10:39 AM  Clinical Narrative:      Patient admitted with Hyponatremia. TOC spoke with husband (Richard)they have a plan to discharge to [facility]. [phone]. Richard updated [facility] today, with discharge plan Wed or Thurs. He request we fax DC summary to [facility] at discharge [phone] TOC to follow.          Expected Discharge Plan: Home/Self Care Barriers to Discharge: Continued Medical Work up  Patient Goals and CMS Choice Patient states their goals for this hospitalization and ongoing recovery are:: to go to [facility] [employer].gov Compare Post Acute Care list provided to:: Patient Choice offered to / list presented to : Patient  Expected Discharge Plan and Services Expected Discharge Plan: Home/Self Care      Living arrangements for the past 2 months: Single Family Home                   Prior Living Arrangements/Services Living arrangements for the past 2 months: Single Family Home Lives with:: Spouse Patient language and need for interpreter reviewed:: Yes        Need for Family Participation in Patient Care: Yes (Comment) Care giver support system in place?: Yes (comment)   Criminal Activity/Legal Involvement Pertinent to Current Situation/Hospitalization: No - Comment as needed  Activities of Daily Living Home Assistive Devices/Equipment: Eyeglasses ADL Screening (condition at time of admission) Patient's cognitive ability adequate to safely complete daily activities?: Yes Is the patient deaf or have difficulty hearing?: No Does the patient have difficulty seeing, even when wearing glasses/contacts?: No Does the patient have difficulty concentrating, remembering, or making decisions?: No Patient able  to express need for assistance with ADLs?: Yes Does the patient have difficulty dressing or bathing?: No Independently performs ADLs?: Yes (appropriate for developmental age) Does the patient have difficulty walking or climbing stairs?: No Weakness of Legs: None Weakness of Arms/Hands: None  Permission Sought/Granted     Share Information with NAME: Delfino Lovett     Permission granted to share info w Relationship: Husband  Permission granted to share info w Contact Information: [facility] [employer]  Emotional Assessment    Affect (typically observed): Accepting Orientation: : Oriented to Self, Oriented to Place, Oriented to  Time, Oriented to Situation Alcohol / Substance Use: Alcohol Use Psych Involvement: No (comment)  Admission diagnosis:  Alcohol withdrawal ([place]) [E17.408] Alcoholic ketoacidosis [X44.81] Hyponatremia [E87.1] Atrial fibrillation with rapid ventricular response (HCC) [I48.91] Patient Active Problem List   Diagnosis Date Noted   Alcohol abuse with intoxication ([place]) 08/12/2021   Alcohol withdrawal ([place]) 85/63/1497   Alcoholic ketoacidosis 02/63/7858   Atrial fibrillation with rapid ventricular response ([place]) 08/12/2021   Intractable vomiting 03/15/2021   Hyponatremia 03/15/2021   Aortic atherosclerosis ([place]) 02/16/2021   Insomnia 02/16/2021   Primary hypertension 10/18/2020   Depression, major, single episode, moderate ([place]) 10/18/2020   Tobacco abuse 10/18/2020   Morbid obesity ([place]) 10/18/2020   GERD (gastroesophageal reflux disease) 10/18/2020   PCP:  Lindell Spar, MD Pharmacy:   [facility] [address] [place] [address] [place] [address] Phone: [phone] Fax: [phone]  [place], [place] [address] [place] [address] Phone: [phone] Fax:  [phone]   Readmission Risk Interventions Readmission Risk Prevention Plan 08/15/2021  Medication Screening Complete   Transportation Screening Complete  Some recent data might be hidden

## 2021-08-15 NOTE — Progress Notes (Signed)
Progress Note  Patient Name: Renee [name] Date of Encounter: 08/15/2021  Primary Cardiologist: New to [facility]  Subjective   No chest pain or palpitations reported this morning.  Somnolent, but does answer questions.  Inpatient Medications    Scheduled Meds:  (feeding supplement) PROSource Plus  30 mL Oral TID BM   atenolol  50 mg Oral Daily   chlordiazePOXIDE  25 mg Oral TID   Chlorhexidine Gluconate Cloth  6 each Topical Daily   enoxaparin (LOVENOX) injection  60 mg Subcutaneous Q24H   feeding supplement  237 mL Oral BID BM   folic acid  1 mg Oral Daily   LORazepam  0-4 mg Intravenous Q12H   multivitamin with minerals  1 tablet Oral Daily   pantoprazole  40 mg Oral Daily   PARoxetine  20 mg Oral Daily   thiamine  100 mg Oral Daily   Or   thiamine  100 mg Intravenous Daily   Continuous Infusions:  dexmedetomidine (PRECEDEX) IV infusion Stopped (08/15/21 0430)   dextrose 5 % and 0.9% NaCl 100 mL/hr at 08/15/21 0157   magnesium sulfate bolus IVPB 4 g (08/15/21 0755)   PRN Meds: acetaminophen **OR** acetaminophen, LORazepam **OR** LORazepam, ondansetron **OR** ondansetron (ZOFRAN) IV, polyethylene glycol   Vital Signs    Vitals:   08/15/21 0104 08/15/21 0200 08/15/21 0347 08/15/21 0747  BP: 116/79 (!) 94/51 90/60   Pulse: 66 65    Resp: 18 (!) 21  (!) 32  Temp:    98.1 F (36.7 C)  TempSrc:    Oral  SpO2: 96% 95%    Weight:      Height:        Intake/Output Summary (Last 24 hours) at 08/15/2021 0849 Last data filed at 08/15/2021 0000 Gross per 24 hour  Intake 1799.05 ml  Output --  Net 1799.05 ml   Filed Weights   08/12/21 1401 08/13/21 0310  Weight: 117.9 kg 113.1 kg    Telemetry    Sinus rhythm.  Personally reviewed.  ECG    No ECG reviewed.  Physical Exam   GEN: No acute distress.   Neck: No JVD. Cardiac: RRR, no murmur, rub, or gallop.  Respiratory: Nonlabored. Clear to auscultation bilaterally. GI: Soft, nontender, bowel sounds  present. MS: No edema.  Labs    Chemistry Recent Labs  Lab 08/13/21 0401 08/14/21 0449 08/15/21 0504  NA 127* 130* 133*  K 3.6 2.9* 3.4*  CL 96* 101 104  CO2 15* 20* 21*  GLUCOSE 116* 151* 129*  BUN 14 9 5*  CREATININE 0.99 0.91 0.79  CALCIUM 8.2* 8.2* 8.3*  PROT 7.3 6.4* 6.0*  ALBUMIN 4.1 3.7 3.3*  AST 133* 136* 138*  ALT 63* 60* 70*  ALKPHOS 162* 127* 113  BILITOT 1.9* 1.1 1.1  GFRNONAA >60 >60 >60  ANIONGAP 16* 9 8     Hematology Recent Labs  Lab 08/12/21 1537 08/13/21 0401 08/15/21 0504  WBC 11.6* 11.1* 3.7*  RBC 3.95 3.84* 3.14*  HGB 13.4 12.9 10.6*  HCT 38.7 37.8 32.8*  MCV 98.0 98.4 104.5*  MCH 33.9 33.6 33.8  MCHC 34.6 34.1 32.3  RDW 12.4 12.5 12.8  PLT 244 219 136*    Cardiac Enzymes Recent Labs  Lab 08/09/21 1830 08/09/21 2109 08/12/21 1537 08/12/21 2150  TROPONINIHS 3 2 4 8     BNP Recent Labs  Lab 08/12/21 1537  BNP 22.0     Radiology    CT HEAD WO CONTRAST (5MM)  Result Date: 08/14/2021 CLINICAL DATA:  Head trauma, moderate to severe EXAM: CT HEAD WITHOUT CONTRAST TECHNIQUE: Contiguous axial images were obtained from the base of the skull through the vertex without intravenous contrast. COMPARISON:  None. FINDINGS: Brain: No evidence of acute infarction, hemorrhage, cerebral edema, mass, mass effect, or midline shift. Ventricles and sulci are within normal limits for age. No extra-axial fluid collection. Vascular: No hyperdense vessel or unexpected calcification. Skull: Normal. Negative for fracture or focal lesion. Sinuses/Orbits: No acute finding. Other: The mastoid air cells are well aerated. IMPRESSION: No acute intracranial process. Electronically Signed   By: Merilyn Baba M.D.   On: 08/14/2021 19:58   ECHOCARDIOGRAM COMPLETE  Result Date: 08/13/2021    ECHOCARDIOGRAM REPORT   Patient Name:   Renee [name] Date of Exam: 08/13/2021 Medical Rec #:  [id number]       Height:       66.0 in Accession #:    [id number]      Weight:        249.3 lb Date of Birth:  07-Oct-1966        BSA:          2.196 m Patient Age:    55 years        BP:           120/86 mmHg Patient Gender: F               HR:           86 bpm. Exam Location:  [facility] Procedure: 2D Echo, Cardiac Doppler and Color Doppler Indications:    Atrial Fibrillation I48.91  History:        Patient has no prior history of Echocardiogram examinations.                 Risk Factors:Hypertension. Alcohol abuse with intoxication                 ([place]), Alcohol withdrawal ([place]). Tobacco abuse.  Sonographer:    Alvino Chapel RCS Referring Phys: [phone] Leanne Chang [facility]  Sonographer Comments: Suboptimal subcostal window. IMPRESSIONS  1. Left ventricular ejection fraction, by estimation, is 60 to 65%. The left ventricle has normal function. The left ventricle has no regional wall motion abnormalities. Left ventricular diastolic parameters were normal.  2. Right ventricular systolic function is normal. The right ventricular size is normal. Tricuspid regurgitation signal is inadequate for assessing PA pressure.  3. The mitral valve is grossly normal. Trivial mitral valve regurgitation.  4. The aortic valve is tricuspid. Aortic valve regurgitation is not visualized.  5. Unable to estimate CVP.  6. Intermittent bowing of atrial septum from right to left without obvious shunt. Comparison(s): No prior Echocardiogram. FINDINGS  Left Ventricle: Left ventricular ejection fraction, by estimation, is 60 to 65%. The left ventricle has normal function. The left ventricle has no regional wall motion abnormalities. The left ventricular internal cavity size was normal in size. There is  borderline left ventricular hypertrophy. Left ventricular diastolic parameters were normal. Right Ventricle: The right ventricular size is normal. No increase in right ventricular wall thickness. Right ventricular systolic function is normal. Tricuspid regurgitation signal is inadequate for assessing PA pressure. Left Atrium: Left  atrial size was normal in size. Right Atrium: Right atrial size was normal in size. Pericardium: There is no evidence of pericardial effusion. Mitral Valve: The mitral valve is grossly normal. Trivial mitral valve regurgitation. Tricuspid Valve: The tricuspid valve is grossly normal. Tricuspid valve regurgitation is  trivial. Aortic Valve: The aortic valve is tricuspid. Aortic valve regurgitation is not visualized. Pulmonic Valve: The pulmonic valve was grossly normal. Pulmonic valve regurgitation is trivial. Aorta: The aortic root is normal in size and structure. Venous: Unable to estimate CVP. The inferior vena cava was not well visualized. IAS/Shunts: No atrial level shunt detected by color flow Doppler.  LEFT VENTRICLE PLAX 2D LVIDd:         5.00 cm  Diastology LVIDs:         3.10 cm  LV e' medial:    7.29 cm/s LV PW:         1.10 cm  LV E/e' medial:  9.7 LV IVS:        0.90 cm  LV e' lateral:   13.10 cm/s LVOT diam:     1.90 cm  LV E/e' lateral: 5.4 LV SV:         72 LV SV Index:   33 LVOT Area:     2.84 cm  RIGHT VENTRICLE RV S prime:     12.10 cm/s TAPSE (M-mode): 1.8 cm LEFT ATRIUM             Index       RIGHT ATRIUM           Index LA diam:        3.00 cm 1.37 cm/m  RA Area:     16.10 cm LA Vol (A2C):   65.3 ml 29.73 ml/m RA Volume:   39.50 ml  17.99 ml/m LA Vol (A4C):   56.0 ml 25.50 ml/m LA Biplane Vol: 63.0 ml 28.68 ml/m  AORTIC VALVE LVOT Vmax:   132.00 cm/s LVOT Vmean:  82.800 cm/s LVOT VTI:    0.253 m  AORTA Ao Root diam: 3.60 cm MITRAL VALVE MV Area (PHT): 3.37 cm    SHUNTS MV Decel Time: 225 msec    Systemic VTI:  0.25 m MV E velocity: 70.40 cm/s  Systemic Diam: 1.90 cm MV A velocity: 46.20 cm/s MV E/A ratio:  1.52 Rozann Lesches MD Electronically signed by Rozann Lesches MD Signature Date/Time: 08/13/2021/11:22:04 AM    Final     Assessment & Plan    1.  Paroxysmal atrial fibrillation in the setting of alcohol abuse and withdrawal.  CHA2DS2-VASc score is 3.  She is maintaining sinus  rhythm having spontaneously converted on intravenous diltiazem and is back on oral atenolol 50 mg daily.  TSH is normal and cardiac enzymes do not suggest ACS.  Echocardiogram shows LVEF 60 to 65% range normal left atrial chamber size.  2.  Essential hypertension.  Was also on chlorthalidone, Lotensin, and clonidine as an outpatient.  Recent systolics ranging from the 90s up to the 150s.  Patient continues on supportive measures in setting of active alcohol withdrawal and ultimately plan for inpatient rehabilitation.  She remains on CIWA protocol.  As discussed in the initial consultation, she is not a candidate for anticoagulation as yet - needs rehabilitation for alcohol abuse first and then establish regular follow-up prior to considering initiation of DOAC.  Would continue atenolol.  No further inpatient cardiac testing at this time.  We will sign off for now, call with questions.  Signed, Rozann Lesches, MD  08/15/2021, 8:49 AM

## 2021-08-16 LAB — CBC
HCT: 33.1 % — ABNORMAL LOW (ref 36.0–46.0)
Hemoglobin: 10.8 g/dL — ABNORMAL LOW (ref 12.0–15.0)
MCH: 33.3 pg (ref 26.0–34.0)
MCHC: 32.6 g/dL (ref 30.0–36.0)
MCV: 102.2 fL — ABNORMAL HIGH (ref 80.0–100.0)
Platelets: 144 10*3/uL — ABNORMAL LOW (ref 150–400)
RBC: 3.24 MIL/uL — ABNORMAL LOW (ref 3.87–5.11)
RDW: 12.5 % (ref 11.5–15.5)
WBC: 4 10*3/uL (ref 4.0–10.5)
nRBC: 0 % (ref 0.0–0.2)

## 2021-08-16 LAB — COMPREHENSIVE METABOLIC PANEL
ALT: 79 U/L — ABNORMAL HIGH (ref 0–44)
AST: 134 U/L — ABNORMAL HIGH (ref 15–41)
Albumin: 3.3 g/dL — ABNORMAL LOW (ref 3.5–5.0)
Alkaline Phosphatase: 111 U/L (ref 38–126)
Anion gap: 6 (ref 5–15)
BUN: <5 mg/dL — ABNORMAL LOW (ref 6–20)
CO2: 25 mmol/L (ref 22–32)
Calcium: 8.6 mg/dL — ABNORMAL LOW (ref 8.9–10.3)
Chloride: 108 mmol/L (ref 98–111)
Creatinine, Ser: 0.73 mg/dL (ref 0.44–1.00)
GFR, Estimated: >60 mL/min (ref >60)
Glucose, Bld: 123 mg/dL — ABNORMAL HIGH (ref 70–99)
Potassium: 3.2 mmol/L — ABNORMAL LOW (ref 3.5–5.1)
Sodium: 139 mmol/L (ref 135–145)
Total Bilirubin: 0.7 mg/dL (ref 0.3–1.2)
Total Protein: 6.1 g/dL — ABNORMAL LOW (ref 6.5–8.1)

## 2021-08-16 LAB — BASIC METABOLIC PANEL
Anion gap: 7 (ref 5–15)
BUN: 6 mg/dL (ref 6–20)
CO2: 23 mmol/L (ref 22–32)
Calcium: 8.6 mg/dL — ABNORMAL LOW (ref 8.9–10.3)
Chloride: 105 mmol/L (ref 98–111)
Creatinine, Ser: 0.88 mg/dL (ref 0.44–1.00)
GFR, Estimated: >60 mL/min (ref >60)
Glucose, Bld: 111 mg/dL — ABNORMAL HIGH (ref 70–99)
Potassium: 4.2 mmol/L (ref 3.5–5.1)
Sodium: 135 mmol/L (ref 135–145)

## 2021-08-16 LAB — MAGNESIUM: Magnesium: 1.5 mg/dL — ABNORMAL LOW (ref 1.7–2.4)

## 2021-08-16 MED ORDER — HALOPERIDOL LACTATE 5 MG/ML IJ SOLN
2.0000 mg | Freq: Once | INTRAMUSCULAR | Status: AC
  Administered 2021-08-16: 2 mg via INTRAMUSCULAR
  Filled 2021-08-16: qty 1

## 2021-08-16 MED ORDER — MAGNESIUM SULFATE 4 GM/100ML IV SOLN
4.0000 g | Freq: Once | INTRAVENOUS | Status: AC
  Administered 2021-08-16: 4 g via INTRAVENOUS
  Filled 2021-08-16: qty 100

## 2021-08-16 MED ORDER — LORAZEPAM 2 MG/ML IJ SOLN: 1.0000 mg | INTRAMUSCULAR | Status: DC | PRN

## 2021-08-16 MED ORDER — LORAZEPAM 1 MG PO TABS: 1.0000 mg | ORAL_TABLET | ORAL | Status: DC | PRN

## 2021-08-16 MED ORDER — LABETALOL HCL 5 MG/ML IV SOLN
10.0000 mg | INTRAVENOUS | Status: DC | PRN
  Administered 2021-08-16: 10 mg via INTRAVENOUS
  Filled 2021-08-16: qty 4

## 2021-08-16 MED ORDER — POTASSIUM CHLORIDE CRYS ER 20 MEQ PO TBCR
40.0000 meq | EXTENDED_RELEASE_TABLET | ORAL | Status: AC
Start: 2021-08-16 — End: 2021-08-16
  Administered 2021-08-16 (×2): 40 meq via ORAL
  Filled 2021-08-16 (×2): qty 2

## 2021-08-16 NOTE — Evaluation (Addendum)
Physical Therapy Evaluation Patient Details Name: KANIA [name] MRN: [id number] DOB: April 12, 1966 Today's Date: 08/16/2021  History of Present Illness  Renee [name] is a 55 y.o. female with medical history significant for obesity, hypertension, depression, atrial fibrillation.  Patient presented to the ED with complaints of heavy drinking over the past 6 days.  Patient has not eaten anything in the past 2 days.  Spouse is present at her bed side.  Patient has tried unsuccessfully to quit drinking alcohol prior to this.  Last drink was about 12 hours ago, between 8 and 10 AM this morning.  She drinks multiple bottles of wine daily.  She reports daily diarrhea up to 4 times daily over the past few weeks.    She also reports central to left-sided chest pain, not related to activity, with associated difficulty breathing, that may be secondary to panic attacks, which the husband confirms she does have.  Quit smoking cigarettes about 5 years ago.  No known family history of heart attacks.   Patient was admitted about 5 weeks ago when [place] traveled to the beach, patient had electrolyte abnormalities, dehydration and atrial fibrillation with hypotension.  She also had withdrawal from alcohol. She denies history of seizures.  Denies hallucinations at this time.     Patient is scheduled to start rehab at a facility-[facility] in [place] on Tuesday, 4th of October.   Clinical Impression  Patient near baseline other than demonstrating slight unsteadiness during gait without loss of balance. Patient ambulated with supervision around the nursing station w/out the use of an AD. Patient discharged from physical therapy to care of nursing for ambulation daily as tolerated for length of stay.         Recommendations for follow up therapy are one component of a multi-disciplinary discharge planning process, led by the attending physician.  Recommendations may be updated based on patient status, additional  functional criteria and insurance authorization.  Follow Up Recommendations Supervision - Intermittent;Supervision for mobility/OOB    Equipment Recommendations  None recommended by PT    Recommendations for Other Services       Precautions / Restrictions Precautions Precautions: None Restrictions Weight Bearing Restrictions: No      Mobility  Bed Mobility Overal bed mobility: Independent                  Transfers Overall transfer level: Independent                  Ambulation/Gait Ambulation/Gait assistance: Supervision Gait Distance (Feet): 100 Feet Assistive device: None Gait Pattern/deviations: Decreased step length - left;Decreased step length - right;Decreased stride length Gait velocity: Decreased   General Gait Details: Patient ambulated with supervision and SBA without using an AD. Patient slightly unsteady during gait, mild stumbling without loss of balance.  Stairs            Wheelchair Mobility    Modified Rankin (Stroke Patients Only)       Balance Overall balance assessment: Independent                                           Pertinent Vitals/Pain Pain Assessment: No/denies pain    Home Living Family/patient expects to be discharged to:: Private residence Living Arrangements: Spouse/significant other Available Help at Discharge: Family Type of Home: House Home Access: Stairs to enter Entrance Stairs-Rails: Left Entrance Stairs-Number of  Steps: 3 Home Layout: Two level;Able to live on main level with bedroom/bathroom Home Equipment: None      Prior Function Level of Independence: Independent               Hand Dominance        Extremity/Trunk Assessment   Upper Extremity Assessment Upper Extremity Assessment: Overall WFL for tasks assessed    Lower Extremity Assessment Lower Extremity Assessment: Overall WFL for tasks assessed    Cervical / Trunk Assessment Cervical / Trunk  Assessment: Normal  Communication   Communication: No difficulties  Cognition Arousal/Alertness: Awake/alert Behavior During Therapy: WFL for tasks assessed/performed Overall Cognitive Status: Within Functional Limits for tasks assessed                                        General Comments General comments (skin integrity, edema, etc.): Patient slightly unsteadyduring gait, mild stumbling without loss of balance.    Exercises     Assessment/Plan    PT Assessment Patent does not need any further PT services  PT Problem List         PT Treatment Interventions      PT Goals (Current goals can be found in the Care Plan section)  Acute Rehab PT Goals Patient Stated Goal: Return home with family to assist PT Goal Formulation: With patient Time For Goal Achievement: 08/16/21 Potential to Achieve Goals: Good    Frequency     Barriers to discharge        Co-evaluation               AM-PAC PT "6 Clicks" Mobility  Outcome Measure Help needed turning from your back to your side while in a flat bed without using bedrails?: None Help needed moving from lying on your back to sitting on the side of a flat bed without using bedrails?: None Help needed moving to and from a bed to a chair (including a wheelchair)?: None Help needed standing up from a chair using your arms (e.g., wheelchair or bedside chair)?: None Help needed to walk in hospital room?: A Little Help needed climbing 3-5 steps with a railing? : A Little 6 Click Score: 22    End of Session   Activity Tolerance: Patient tolerated treatment well Patient left: in chair;with call bell/phone within reach Nurse Communication: Mobility status PT Visit Diagnosis: Unsteadiness on feet (R26.81);Other abnormalities of gait and mobility (R26.89);Muscle weakness (generalized) (M62.81)    Time: 3810-1751 PT Time Calculation (min) (ACUTE ONLY): 25 min   Charges:   PT Evaluation $PT Eval Moderate  Complexity: 1 Mod PT Treatments $Therapeutic Activity: 23-37 mins        Cassie Jones, SPT   During this treatment session, the therapist was present, participating in and directing the treatment.  4:35 PM, 08/16/21 Lonell Grandchild, MPT Physical Therapist with [facility] [phone] office [phone] mobile phone

## 2021-08-16 NOTE — TOC Progression Note (Signed)
Transition of Care [facility]) - Progression Note    Patient Details  Name: Renee [name] MRN: [id number] Date of Birth: Apr 08, 1966  [facility] of [facility] CM/SW Contact  Shade Flood, LCSW Phone Number: 08/16/2021, 4:20 PM  Clinical Narrative:     TOC following. Notified by pt's RN of clinical documentation being requested by [facility]. Faxed requested information. Anticipating dc tomorrow.  Expected Discharge Plan: Home/Self Care Barriers to Discharge: Continued Medical Work up  Expected Discharge Plan and Services Expected Discharge Plan: Home/Self Care       Living arrangements for the past 2 months: Single Family Home                                       Social Determinants of Health (SDOH) Interventions    Readmission Risk Interventions Readmission Risk Prevention Plan 08/15/2021  Medication Screening Complete  Transportation Screening Complete  Some recent data might be hidden

## 2021-08-16 NOTE — Progress Notes (Signed)
Patient Demographics:    Renee [name], is a 55 y.o. female, DOB - 1966-02-17, DGL:[id number]  Admit date - 08/12/2021   Admitting Physician Bethena Roys, MD  Outpatient Primary MD for the patient is Lindell Spar, MD  LOS - 4   Chief Complaint  Patient presents with   Emesis        Subjective:    Renee [name] today has no fevers, no emesis,  No chest pain,   -Tremors improving, less agitated, more cooperative, still has episodes of disorientation and anxiety/restlessness from time to time  Assessment  & Plan :    Principal Problem:   Hyponatremia Active Problems:   Alcohol abuse with intoxication ([place])   Alcohol withdrawal ([place])   Alcoholic ketoacidosis   Atrial fibrillation with rapid ventricular response ([place])   Brief Narrative: Renee [name] is a 55 year old female who presents with recent heavy drinking and seeking medical assistance in quitting. Hx of obesity, htn, depression, and consuming several bottles of wine daily. She is currently agreeable, anxious, reports visual hallucinations. UDS without unexpected findings. She reports numerous loose stools, no infectious cause suspected, fell while attempting to use the bedside toilet, head ct was without acute findings. She has a hx of afib, not currently anticoagulated due to fall risk and alcohol use. Once she has passed the acute withdrawal phase, the plan is to go to [facility], which she asks about stopping by home first, and I have emphasized that she should go directly there and have her husband bring her clothes.     Assessment & Plan:      Hyponatremia secondary to chronic alcohol abuse -initial sodium 120   -Sodium has normalized     Alcohol abuse with intoxication/DTs -ciwa protocol in place -medical clearance then to [facility] -folic acid, thiamine, and multivitamin --Tremors improving, less agitated,  more cooperative, still has episodes of disorientation and anxiety/restlessness from time to time  -Patient did not tolerate Precedex due to hypotension -c/n librium tid     Alcoholic ketoacidosis -initial anion gap acidosis at 20, resolved     Atrial fibrillation with rapid ventricular response -initially in atrial fib, was placed on diltiazem with conversion to sinus rhythm. -currently sinus rhythm, atenolol - currently not on anticoagulants due to alcohol use, risk of fall and head trauma. -CHA2DS2-VASc score is 3  S/p Fall 08/14/21,--- patient fell while trying to get a bedside commode --possibly struck head, CT head without acute finding -[facility] requesting PT eval to evaluate possible risk of falls prior to admitting patient for detox or alcohol rehab    Transaminitis--consistent with alcoholic hepatitis -elevated AST 08/12/21   -=192, -elevated ALT  08/12/21---= 70,    Hepatic Function Latest Ref Rng & Units 08/16/2021 08/15/2021 08/14/2021  Total Protein 6.5 - 8.1 g/dL 6.1(L) 6.0(L) 6.4(L)  Albumin 3.5 - 5.0 g/dL 3.3(L) 3.3(L) 3.7  AST 15 - 41 U/L 134(H) 138(H) 136(H)  ALT 0 - 44 U/L 79(H) 70(H) 60(H)  Alk Phosphatase 38 - 126 U/L 111 113 127(H)  Total Bilirubin 0.3 - 1.2 mg/dL 0.7 1.1 1.1        Hypokalemia/hypomagnesemia----EtOH related -Replaced   Macrocytic Anemia/pancytopenia--related to EtOH abuse -multivitamin, folate, thiamine  Depression -on home paroxetine 21m qd     Diarrhea -she is having frequent loose bowel movements -non inflammatory, no blood, no mucous -no suspicion for infectious cause or cdiff -attempted to use bedside toilet without assistance, fell on 08/14/21- head ct, no acute findings    Disposition---[facility] requesting PT eval to evaluate possible risk of falls prior to admitting patient for detox or alcohol rehab     DVT prophylaxis: lovenox Code Status: full Family  Communication: Disposition Plan: [facility] 10/5?     Consultants:  cardiology   Procedures:  Head ct - post fall- no acute findings    Disposition/Need for in-Hospital Stay- patient unable to be discharged at this time due to acute alcohol withdrawal requiring IV lorazepam awaiting [facility] --Status is: Inpatient  Remains inpatient appropriate because: Please see dispo above  Disposition: The patient is from: Home              Anticipated d/c is to:  [facility]cohol rehab              Anticipated d/c date is: 1 day              Patient currently is medically stable to d/c. Barriers: Awaiting [facility] when bed available and after PT clearance  Code Status :  -  Code Status: Full Code   Family Communication:   (patient is alert, awake and coherent)   Consults  :  na  DVT Prophylaxis  :   - SCDs      Lab Results  Component Value Date   PLT 144 (L) 08/16/2021    Inpatient Medications  Scheduled Meds:  (feeding supplement) PROSource Plus  30 mL Oral TID BM   atenolol  50 mg Oral Daily   chlordiazePOXIDE  25 mg Oral TID   Chlorhexidine Gluconate Cloth  6 each Topical Daily   enoxaparin (LOVENOX) injection  60 mg Subcutaneous Q24H   feeding supplement  237 mL Oral BID BM   folic acid  1 mg Oral Daily   LORazepam  0-4 mg Intravenous Q12H   multivitamin with minerals  1 tablet Oral Daily   pantoprazole  40 mg Oral Daily   PARoxetine  20 mg Oral Daily   thiamine  100 mg Oral Daily   Or   thiamine  100 mg Intravenous Daily   Continuous Infusions:  dexmedetomidine (PRECEDEX) IV infusion Stopped (08/15/21 0342)   dextrose 5 % and 0.9% NaCl 100 mL/hr at 08/16/21 1320   PRN Meds:.acetaminophen **OR** acetaminophen, labetalol, LORazepam **OR** LORazepam, ondansetron **OR** ondansetron (ZOFRAN) IV, polyethylene glycol    Anti-infectives (From admission, onward)    None          Objective:   Vitals:   08/16/21 0900 08/16/21 0933 08/16/21 1200 08/16/21 1300  BP:  (!) 177/95 (!) 168/96 (!) 143/82  Pulse:  81 63 78  Resp:  (!) 25 (!) 21 20  Temp: 98 F (36.7 C)     TempSrc: Oral     SpO2:  96% 96% 96%  Weight:      Height:        Wt Readings from Last 3 Encounters:  08/13/21 113.1 kg  08/09/21 104.3 kg  06/22/21 116.3 kg     Intake/Output Summary (Last 24 hours) at 08/16/2021 1416 Last data filed at 08/16/2021 0400 Gross per 24 hour  Intake 3289.21 ml  Output --  Net  3289.21 ml     Physical Exam  Gen:- Awake Alert, more cooperative HEENT:- [place].AT, No sclera icterus Neck-Supple Neck,No JVD,.  Lungs-  CTAB , fair symmetrical air movement CV- S1, S2 normal, regular  Abd-  +ve B.Sounds, Abd Soft, No tenderness,    Extremity/Skin:- No  edema, pedal pulses present Neuro-Psych-Tremors improving, less agitated, more cooperative, still has episodes of disorientation and anxiety/restlessness from time to time   Data Review:   Micro Results Recent Results (from the past 240 hour(s))  SARS CORONAVIRUS 2 (TAT 6-24 HRS) Nasopharyngeal Nasopharyngeal Swab     Status: None   Collection Time: 08/12/21  8:35 PM   Specimen: Nasopharyngeal Swab  Result Value Ref Range Status   SARS Coronavirus 2 NEGATIVE NEGATIVE Final    Comment: (NOTE) SARS-CoV-2 target nucleic acids are NOT DETECTED.  The SARS-CoV-2 RNA is generally detectable in upper and lower respiratory specimens during the acute phase of infection. Negative results do not preclude SARS-CoV-2 infection, do not rule out co-infections with other pathogens, and should not be used as the sole basis for treatment or other patient management decisions. Negative results must be combined with clinical observations, patient history, and epidemiological information. The expected result is Negative.  Fact Sheet for Patients: SugarRoll.be  Fact Sheet for Healthcare  Providers: https://www.woods-mathews.com/  This test is not yet approved or cleared by the [place] FDA and  has been authorized for detection and/or diagnosis of SARS-CoV-2 by FDA under an Emergency Use Authorization (EUA). This EUA will remain  in effect (meaning this test can be used) for the duration of the COVID-19 declaration under Se ction 564(b)(1) of the Act, 21 U.S.C. section 360bbb-3(b)(1), unless the authorization is terminated or revoked sooner.  Performed at [facility], [place] [address], [place], [place] [address]   Resp Panel by RT-PCR (Flu A&B, Covid) Nasopharyngeal Swab     Status: None   Collection Time: 08/13/21  1:25 AM   Specimen: Nasopharyngeal Swab; Nasopharyngeal(NP) swabs in vial transport medium  Result Value Ref Range Status   SARS Coronavirus 2 by RT PCR NEGATIVE NEGATIVE Final    Comment: (NOTE) SARS-CoV-2 target nucleic acids are NOT DETECTED.  The SARS-CoV-2 RNA is generally detectable in upper respiratory specimens during the acute phase of infection. The lowest concentration of SARS-CoV-2 viral copies this assay can detect is 138 copies/mL. A negative result does not preclude SARS-Cov-2 infection and should not be used as the sole basis for treatment or other patient management decisions. A negative result may occur with  improper specimen collection/handling, submission of specimen other than nasopharyngeal swab, presence of viral mutation(s) within the areas targeted by this assay, and inadequate number of viral copies(<138 copies/mL). A negative result must be combined with clinical observations, patient history, and epidemiological information. The expected result is Negative.  Fact Sheet for Patients:  EntrepreneurPulse.com.au  Fact Sheet for Healthcare Providers:  IncredibleEmployment.be  This test is no t yet approved or cleared by the [place] FDA and  has been authorized  for detection and/or diagnosis of SARS-CoV-2 by FDA under an Emergency Use Authorization (EUA). This EUA will remain  in effect (meaning this test can be used) for the duration of the COVID-19 declaration under Section 564(b)(1) of the Act, 21 U.S.C.section 360bbb-3(b)(1), unless the authorization is terminated  or revoked sooner.       Influenza A by PCR NEGATIVE NEGATIVE Final   Influenza B by PCR NEGATIVE NEGATIVE Final    Comment: (NOTE) The  Xpert Xpress SARS-CoV-2/FLU/RSV plus assay is intended as an aid in the diagnosis of influenza from Nasopharyngeal swab specimens and should not be used as a sole basis for treatment. Nasal washings and aspirates are unacceptable for Xpert Xpress SARS-CoV-2/FLU/RSV testing.  Fact Sheet for Patients: EntrepreneurPulse.com.au  Fact Sheet for Healthcare Providers: IncredibleEmployment.be  This test is not yet approved or cleared by the [place] FDA and has been authorized for detection and/or diagnosis of SARS-CoV-2 by FDA under an Emergency Use Authorization (EUA). This EUA will remain in effect (meaning this test can be used) for the duration of the COVID-19 declaration under Section 564(b)(1) of the Act, 21 U.S.C. section 360bbb-3(b)(1), unless the authorization is terminated or revoked.  Performed at [facility], [address] [place], [place] [address]   MRSA Next Gen by PCR, Nasal     Status: None   Collection Time: 08/13/21  9:00 AM   Specimen: Nasal Mucosa; Nasal Swab  Result Value Ref Range Status   MRSA by PCR Next Gen NOT DETECTED NOT DETECTED Final    Comment: (NOTE) The GeneXpert MRSA Assay (FDA approved for NASAL specimens only), is one component of a comprehensive MRSA colonization surveillance program. It is not intended to diagnose MRSA infection nor to guide or monitor treatment for MRSA infections. Test performance is not FDA approved in patients less than 44  years old. Performed at [facility], [address] [place],  [address]     Radiology Reports CT HEAD WO CONTRAST (5MM)  Result Date: 08/14/2021 CLINICAL DATA:  Head trauma, moderate to severe EXAM: CT HEAD WITHOUT CONTRAST TECHNIQUE: Contiguous axial images were obtained from the base of the skull through the vertex without intravenous contrast. COMPARISON:  None. FINDINGS: Brain: No evidence of acute infarction, hemorrhage, cerebral edema, mass, mass effect, or midline shift. Ventricles and sulci are within normal limits for age. No extra-axial fluid collection. Vascular: No hyperdense vessel or unexpected calcification. Skull: Normal. Negative for fracture or focal lesion. Sinuses/Orbits: No acute finding. Other: The mastoid air cells are well aerated. IMPRESSION: No acute intracranial process. Electronically Signed   By: Merilyn Baba M.D.   On: 08/14/2021 19:58   DG Chest Port 1 View  Result Date: 08/12/2021 CLINICAL DATA:  Chest pain and shortness of breath. EXAM: PORTABLE CHEST 1 VIEW COMPARISON:  Chest radiograph August 09, 2021. FINDINGS: Monitoring leads overlie the patient. Stable cardiac and mediastinal contours. No large area pulmonary consolidation. No pleural effusion or pneumothorax. Thoracic spine degenerative changes. Chronic right rib fractures. IMPRESSION: No active disease. Electronically Signed   By: Lovey Newcomer M.D.   On: 08/12/2021 16:45   DG Chest Port 1 View  Result Date: 08/09/2021 CLINICAL DATA:  Chest pain.  Shortness of breath. EXAM: PORTABLE CHEST 1 VIEW COMPARISON:  CT chest 11/18/2020 FINDINGS: The heart and mediastinal contours are within normal limits. No focal consolidation. No pulmonary edema. No pleural effusion. No pneumothorax. No acute osseous abnormality. IMPRESSION: No active disease. Electronically Signed   By: Iven Finn M.D.   On: 08/09/2021 18:31   ECHOCARDIOGRAM COMPLETE  Result Date: 08/13/2021    ECHOCARDIOGRAM REPORT   Patient  Name:   GIAH FICKETT Date of Exam: 08/13/2021 Medical Rec #:  [id number]       Height:       66.0 in Accession #:    [id number]      Weight:       249.3 lb Date of Birth:  10-Nov-1966  BSA:          2.196 m Patient Age:    13 years        BP:           120/86 mmHg Patient Gender: F               HR:           86 bpm. Exam Location:  [facility] Procedure: 2D Echo, Cardiac Doppler and Color Doppler Indications:    Atrial Fibrillation I48.91  History:        Patient has no prior history of Echocardiogram examinations.                 Risk Factors:Hypertension. Alcohol abuse with intoxication                 ([place]), Alcohol withdrawal ([place]). Tobacco abuse.  Sonographer:    Alvino Chapel RCS Referring Phys: [phone] Leanne Chang [facility]  Sonographer Comments: Suboptimal subcostal window. IMPRESSIONS  1. Left ventricular ejection fraction, by estimation, is 60 to 65%. The left ventricle has normal function. The left ventricle has no regional wall motion abnormalities. Left ventricular diastolic parameters were normal.  2. Right ventricular systolic function is normal. The right ventricular size is normal. Tricuspid regurgitation signal is inadequate for assessing PA pressure.  3. The mitral valve is grossly normal. Trivial mitral valve regurgitation.  4. The aortic valve is tricuspid. Aortic valve regurgitation is not visualized.  5. Unable to estimate CVP.  6. Intermittent bowing of atrial septum from right to left without obvious shunt. Comparison(s): No prior Echocardiogram. FINDINGS  Left Ventricle: Left ventricular ejection fraction, by estimation, is 60 to 65%. The left ventricle has normal function. The left ventricle has no regional wall motion abnormalities. The left ventricular internal cavity size was normal in size. There is  borderline left ventricular hypertrophy. Left ventricular diastolic parameters were normal. Right Ventricle: The right ventricular size is normal. No increase in right ventricular wall  thickness. Right ventricular systolic function is normal. Tricuspid regurgitation signal is inadequate for assessing PA pressure. Left Atrium: Left atrial size was normal in size. Right Atrium: Right atrial size was normal in size. Pericardium: There is no evidence of pericardial effusion. Mitral Valve: The mitral valve is grossly normal. Trivial mitral valve regurgitation. Tricuspid Valve: The tricuspid valve is grossly normal. Tricuspid valve regurgitation is trivial. Aortic Valve: The aortic valve is tricuspid. Aortic valve regurgitation is not visualized. Pulmonic Valve: The pulmonic valve was grossly normal. Pulmonic valve regurgitation is trivial. Aorta: The aortic root is normal in size and structure. Venous: Unable to estimate CVP. The inferior vena cava was not well visualized. IAS/Shunts: No atrial level shunt detected by color flow Doppler.  LEFT VENTRICLE PLAX 2D LVIDd:         5.00 cm  Diastology LVIDs:         3.10 cm  LV e' medial:    7.29 cm/s LV PW:         1.10 cm  LV E/e' medial:  9.7 LV IVS:        0.90 cm  LV e' lateral:   13.10 cm/s LVOT diam:     1.90 cm  LV E/e' lateral: 5.4 LV SV:         72 LV SV Index:   33 LVOT Area:     2.84 cm  RIGHT VENTRICLE RV S prime:     12.10 cm/s TAPSE (M-mode): 1.8 cm LEFT ATRIUM  Index       RIGHT ATRIUM           Index LA diam:        3.00 cm 1.37 cm/m  RA Area:     16.10 cm LA Vol (A2C):   65.3 ml 29.73 ml/m RA Volume:   39.50 ml  17.99 ml/m LA Vol (A4C):   56.0 ml 25.50 ml/m LA Biplane Vol: 63.0 ml 28.68 ml/m  AORTIC VALVE LVOT Vmax:   132.00 cm/s LVOT Vmean:  82.800 cm/s LVOT VTI:    0.253 m  AORTA Ao Root diam: 3.60 cm MITRAL VALVE MV Area (PHT): 3.37 cm    SHUNTS MV Decel Time: 225 msec    Systemic VTI:  0.25 m MV E velocity: 70.40 cm/s  Systemic Diam: 1.90 cm MV A velocity: 46.20 cm/s MV E/A ratio:  1.52 Rozann Lesches MD Electronically signed by Rozann Lesches MD Signature Date/Time: 08/13/2021/11:22:04 AM    Final      CBC Recent  Labs  Lab 08/09/21 1830 08/12/21 1537 08/13/21 0401 08/15/21 0504 08/16/21 0517  WBC 11.1* 11.6* 11.1* 3.7* 4.0  HGB 13.6 13.4 12.9 10.6* 10.8*  HCT 39.5 38.7 37.8 32.8* 33.1*  PLT 347 244 219 136* 144*  MCV 98.3 98.0 98.4 104.5* 102.2*  MCH 33.8 33.9 33.6 33.8 33.3  MCHC 34.4 34.6 34.1 32.3 32.6  RDW 12.4 12.4 12.5 12.8 12.5  LYMPHSABS 3.9 2.3  --  1.1  --   MONOABS 0.5 0.4  --  0.4  --   EOSABS 0.1 0.0  --  0.3  --   BASOSABS 0.1 0.1  --  0.0  --     Chemistries  Recent Labs  Lab 08/12/21 1537 08/13/21 0401 08/14/21 0449 08/15/21 0504 08/16/21 0517  NA 120* 127* 130* 133* 139  K 4.1 3.6 2.9* 3.4* 3.2*  CL 89* 96* 101 104 108  CO2 11* 15* 20* 21* 25  GLUCOSE 74 116* 151* 129* 123*  BUN 20 14 9  5* <5*  CREATININE 1.16* 0.99 0.91 0.79 0.73  CALCIUM 7.9* 8.2* 8.2* 8.3* 8.6*  MG 1.6*  --  1.7 1.6* 1.5*  AST 192* 133* 136* 138* 134*  ALT 70* 63* 60* 70* 79*  ALKPHOS 172* 162* 127* 113 111  BILITOT 1.3* 1.9* 1.1 1.1 0.7   ------------------------------------------------------------------------------------------------------------------ No results for input(s): CHOL, HDL, LDLCALC, TRIG, CHOLHDL, LDLDIRECT in the last 72 hours.  Lab Results  Component Value Date   HGBA1C 5.5 03/16/2021   ------------------------------------------------------------------------------------------------------------------ No results for input(s): TSH, T4TOTAL, T3FREE, THYROIDAB in the last 72 hours.  Invalid input(s): FREET3 ------------------------------------------------------------------------------------------------------------------ No results for input(s): VITAMINB12, FOLATE, FERRITIN, TIBC, IRON, RETICCTPCT in the last 72 hours.  Coagulation profile No results for input(s): INR, PROTIME in the last 168 hours.  No results for input(s): DDIMER in the last 72 hours.  Cardiac Enzymes No results for input(s): CKMB, TROPONINI, MYOGLOBIN in the last 168 hours.  Invalid input(s):  CK ------------------------------------------------------------------------------------------------------------------    Component Value Date/Time   BNP 22.0 08/12/2021 1537     Demetria Lightsey M.D on 08/16/2021 at 2:16 PM  Go to [email or web address] - for contact info  [facility] - Office  [phone]

## 2021-08-17 ENCOUNTER — Encounter (HOSPITAL_COMMUNITY)
Admission: EM | Disposition: A | Discharge status: Rehabilitation Facility | Payer: Self-pay | Source: Home / Self Care | Attending: Family Medicine

## 2021-08-17 ENCOUNTER — Ambulatory Visit (HOSPITAL_COMMUNITY): Admission: RE | Admit: 2021-08-17 | Payer: 59 | Source: Home / Self Care | Admitting: Internal Medicine

## 2021-08-17 LAB — COMPREHENSIVE METABOLIC PANEL
ALT: 80 U/L — ABNORMAL HIGH (ref 0–44)
AST: 118 U/L — ABNORMAL HIGH (ref 15–41)
Albumin: 3.2 g/dL — ABNORMAL LOW (ref 3.5–5.0)
Alkaline Phosphatase: 111 U/L (ref 38–126)
Anion gap: 7 (ref 5–15)
BUN: 9 mg/dL (ref 6–20)
CO2: 26 mmol/L (ref 22–32)
Calcium: 8.4 mg/dL — ABNORMAL LOW (ref 8.9–10.3)
Chloride: 106 mmol/L (ref 98–111)
Creatinine, Ser: 0.67 mg/dL (ref 0.44–1.00)
GFR, Estimated: >60 mL/min (ref >60)
Glucose, Bld: 130 mg/dL — ABNORMAL HIGH (ref 70–99)
Potassium: 3.5 mmol/L (ref 3.5–5.1)
Sodium: 139 mmol/L (ref 135–145)
Total Bilirubin: 0.5 mg/dL (ref 0.3–1.2)
Total Protein: 5.7 g/dL — ABNORMAL LOW (ref 6.5–8.1)

## 2021-08-17 LAB — CBC
HCT: 32.2 % — ABNORMAL LOW (ref 36.0–46.0)
Hemoglobin: 10.5 g/dL — ABNORMAL LOW (ref 12.0–15.0)
MCH: 33.8 pg (ref 26.0–34.0)
MCHC: 32.6 g/dL (ref 30.0–36.0)
MCV: 103.5 fL — ABNORMAL HIGH (ref 80.0–100.0)
Platelets: 134 10*3/uL — ABNORMAL LOW (ref 150–400)
RBC: 3.11 MIL/uL — ABNORMAL LOW (ref 3.87–5.11)
RDW: 12.9 % (ref 11.5–15.5)
WBC: 4.9 10*3/uL (ref 4.0–10.5)
nRBC: 0 % (ref 0.0–0.2)

## 2021-08-17 SURGERY — COLONOSCOPY
Anesthesia: Moderate Sedation

## 2021-08-17 MED ORDER — HALOPERIDOL LACTATE 5 MG/ML IJ SOLN
5.0000 mg | Freq: Once | INTRAMUSCULAR | Status: AC
  Administered 2021-08-17: 5 mg via INTRAMUSCULAR

## 2021-08-17 MED ORDER — HALOPERIDOL LACTATE 5 MG/ML IJ SOLN
INTRAMUSCULAR | Status: AC
  Filled 2021-08-17: qty 1

## 2021-08-17 NOTE — Progress Notes (Addendum)
Patient Demographics:    Renee [name], is a 55 y.o. female, DOB - June 07, 1966, QDU:[id number]  Admit date - 08/12/2021   Admitting Physician Ejiroghene Arlyce Dice, MD  Outpatient Primary MD for the patient is Lindell Spar, MD  LOS - 5   Chief Complaint  Patient presents with   Emesis        Subjective:    [name] today has no fevers, no emesis,  No chest pain,   -Patient ambulating hallways with husband at bedside, eating and drinking okay independent with ADLs at this time -Apparently [facility] alcohol[facility]ox rehab program unable to take patient until 08/18/2021  Assessment  & Plan :    Principal Problem:   Hyponatremia Active Problems:   Alcohol abuse with intoxication ([place])   Alcohol withdrawal ([place])   Alcoholic ketoacidosis   Atrial fibrillation with rapid ventricular response ([place])   Brief Narrative: Renee [name] is a 55 year old female who presents with recent heavy drinking and seeking medical assistance in quitting. Hx of obesity, htn, depression, and consuming several bottles of wine daily. She is currently agreeable, anxious, reports visual hallucinations. UDS without unexpected findings. She reports numerous loose stools, no infectious cause suspected, fell while attempting to use the bedside toilet, head ct was without acute findings. She has a hx of afib, not currently anticoagulated due to fall risk and alcohol use. Once she has passed the acute withdrawal phase, the plan is to go to [facility], which she asks about stopping by home first, and I have emphasized that she should go directly there and have her husband bring her clothes.     Assessment & Plan:      Hyponatremia secondary to chronic alcohol abuse -initial sodium 120   -Sodium has normalized     Alcohol abuse with intoxication/DTs --Much improved, no longer requiring lorazepam -Patient is Now  medically stable for transfer to-[facility] rehab -c/n folic acid, thiamine, and multivitamin --Tremors improved, cooperative, still has episodes of disorientation and anxiety/restlessness from time to time -c/n librium tid     Alcoholic ketoacidosis -initial anion gap acidosis at 20, resolved     Atrial fibrillation with rapid ventricular response -initially in atrial fib, was placed on diltiazem with conversion to sinus rhythm. -currently sinus rhythm, atenolol - currently not on anticoagulants due to alcohol use, risk of fall and head trauma. -CHA2DS2-VASc score is 3  S/p Fall 08/14/21,--- patient fell while trying to get a bedside commode while having alcohol withdrawal--possibly struck head, CT head without acute finding -Patient's gait is Now steady, ambulating hallways without assistance    Transaminitis--consistent with alcoholic hepatitis -elevated AST 08/12/21   -=192, -elevated ALT  08/12/21---= 70,   - Hepatic Function Latest Ref Rng & Units 08/17/2021 08/16/2021 08/15/2021  Total Protein 6.5 - 8.1 g/dL 5.7(L) 6.1(L) 6.0(L)  Albumin 3.5 - 5.0 g/dL 3.2(L) 3.3(L) 3.3(L)  AST 15 - 41 U/L 118(H) 134(H) 138(H)  ALT 0 - 44 U/L 80(H) 79(H) 70(H)  Alk Phosphatase 38 - 126 U/L 111 111 113  Total Bilirubin 0.3 - 1.2 mg/dL 0.5 0.7 1.1      Hypokalemia/hypomagnesemia----EtOH related -Replaced   Macrocytic Anemia/pancytopenia--related to EtOH abuse -multivitamin, folate, thiamine  Depression -on home paroxetine 24m qd     Diarrhea -non inflammatory, no blood, no mucous -no suspicion for infectious cause or cdiff -Improved    Disposition---[facility] facility in a.m. --Apparently [facility] program unable to take patient until 08/18/2021   DVT prophylaxis: lovenox Code Status: full Family Communication: Disposition Plan: [facility] 08/18/21     Consultants:  cardiology   Procedures:  Head ct - post  fall- no acute findings   -Status is: Inpatient  Remains inpatient appropriate because: Please see dispo above  Disposition: The patient is from: Home              [facility]              Anticipated d/c date is: 1 day              Patient currently is medically stable to d/c. Barriers: [facility]  Code Status :  -  Code Status: Full Code   Family Communication:   (patient is alert, awake and coherent)  -Husband at bedside Consults  :  na  DVT Prophylaxis  :   - SCDs      Lab Results  Component Value Date   PLT 134 (L) 08/17/2021    Inpatient Medications  Scheduled Meds:  haloperidol lactate       (feeding supplement) PROSource Plus  30 mL Oral TID BM   atenolol  50 mg Oral Daily   chlordiazePOXIDE  25 mg Oral TID   Chlorhexidine Gluconate Cloth  6 each Topical Daily   enoxaparin (LOVENOX) injection  60 mg Subcutaneous Q24H   feeding supplement  237 mL Oral BID BM   folic acid  1 mg Oral Daily   multivitamin with minerals  1 tablet Oral Daily   pantoprazole  40 mg Oral Daily   PARoxetine  20 mg Oral Daily   thiamine  100 mg Oral Daily   Or   thiamine  100 mg Intravenous Daily   Continuous Infusions:  dexmedetomidine (PRECEDEX) IV infusion Stopped (08/15/21 0342)   dextrose 5 % and 0.9% NaCl Stopped (08/16/21 1552)   PRN Meds:.acetaminophen **OR** acetaminophen, labetalol, LORazepam **OR** LORazepam, ondansetron **OR** ondansetron (ZOFRAN) IV, polyethylene glycol    Anti-infectives (From admission, onward)    None         Objective:   Vitals:   08/17/21 1300 08/17/21 1400 08/17/21 1500 08/17/21 1807  BP:    (!) 168/94  Pulse:      Resp: (!) 22 16    Temp:   98.6 F (37 C)   TempSrc:   Oral   SpO2:      Weight:      Height:        Wt Readings from Last 3 Encounters:  08/13/21 113.1 kg  08/09/21 104.3 kg  06/22/21 116.3 kg      Intake/Output Summary (Last 24 hours) at 08/17/2021 1819 Last data filed at 08/17/2021 0900 Gross per 24 hour  Intake 640 ml  Output --  Net 640 ml     Physical Exam  Gen:- Awake Alert,  cooperative HEENT:- [place].AT, No sclera icterus Neck-Supple Neck,No JVD,.  Lungs-  CTAB , fair symmetrical air movement CV- S1, S2 normal, regular  Abd-  +ve B.Sounds, Abd Soft, No tenderness,    Extremity/Skin:- No  edema, pedal pulses present Neuro-Psych-Tremors improved, cooperative, gait is  steady  Data Review:   Micro Results Recent Results (from the past 240 hour(s))  SARS CORONAVIRUS 2 (TAT 6-24 HRS) Nasopharyngeal Nasopharyngeal Swab     Status: None   Collection Time: 08/12/21  8:35 PM   Specimen: Nasopharyngeal Swab  Result Value Ref Range Status   SARS Coronavirus 2 NEGATIVE NEGATIVE Final    Comment: (NOTE) SARS-CoV-2 target nucleic acids are NOT DETECTED.  The SARS-CoV-2 RNA is generally detectable in upper and lower respiratory specimens during the acute phase of infection. Negative results do not preclude SARS-CoV-2 infection, do not rule out co-infections with other pathogens, and should not be used as the sole basis for treatment or other patient management decisions. Negative results must be combined with clinical observations, patient history, and epidemiological information. The expected result is Negative.  Fact Sheet for Patients: SugarRoll.be  Fact Sheet for Healthcare Providers: https://www.woods-mathews.com/  This test is not yet approved or cleared by the [place] FDA and  has been authorized for detection and/or diagnosis of SARS-CoV-2 by FDA under an Emergency Use Authorization (EUA). This EUA will remain  in effect (meaning this test can be used) for the duration of the COVID-19 declaration under Se ction 564(b)(1) of the Act, 21 U.S.C. section 360bbb-3(b)(1), unless the authorization is terminated  or revoked sooner.  Performed at [facility], [place] [address], [place], [place] [address]   Resp Panel by RT-PCR (Flu A&B, Covid) Nasopharyngeal Swab     Status: None   Collection Time: 08/13/21  1:25 AM   Specimen: Nasopharyngeal Swab; Nasopharyngeal(NP) swabs in vial transport medium  Result Value Ref Range Status   SARS Coronavirus 2 by RT PCR NEGATIVE NEGATIVE Final    Comment: (NOTE) SARS-CoV-2 target nucleic acids are NOT DETECTED.  The SARS-CoV-2 RNA is generally detectable in upper respiratory specimens during the acute phase of infection. The lowest concentration of SARS-CoV-2 viral copies this assay can detect is 138 copies/mL. A negative result does not preclude SARS-Cov-2 infection and should not be used as the sole basis for treatment or other patient management decisions. A negative result may occur with  improper specimen collection/handling, submission of specimen other than nasopharyngeal swab, presence of viral mutation(s) within the areas targeted by this assay, and inadequate number of viral copies(<138 copies/mL). A negative result must be combined with clinical observations, patient history, and epidemiological information. The expected result is Negative.  Fact Sheet for Patients:  EntrepreneurPulse.com.au  Fact Sheet for Healthcare Providers:  IncredibleEmployment.be  This test is no t yet approved or cleared by the [place] FDA and  has been authorized for detection and/or diagnosis of SARS-CoV-2 by FDA under an Emergency Use Authorization (EUA). This EUA will remain  in effect (meaning this test can be used) for the duration of the COVID-19 declaration under Section 564(b)(1) of the Act, 21 U.S.C.section 360bbb-3(b)(1), unless the authorization is terminated  or revoked sooner.       Influenza A by PCR NEGATIVE NEGATIVE Final   Influenza B by PCR NEGATIVE NEGATIVE Final    Comment: (NOTE) The Xpert  Xpress SARS-CoV-2/FLU/RSV plus assay is intended as an aid in the diagnosis of influenza from Nasopharyngeal swab specimens and should not be used as a sole basis for treatment. Nasal washings and aspirates are unacceptable for Xpert Xpress SARS-CoV-2/FLU/RSV testing.  Fact Sheet for Patients: EntrepreneurPulse.com.au  Fact Sheet for Healthcare Providers: IncredibleEmployment.be  This test is not yet approved or cleared by the [place] and has been authorized  for detection and/or diagnosis of SARS-CoV-2 by FDA under an Emergency Use Authorization (EUA). This EUA will remain in effect (meaning this test can be used) for the duration of the COVID-19 declaration under Section 564(b)(1) of the Act, 21 U.S.C. section 360bbb-3(b)(1), unless the authorization is terminated or revoked.  Performed at [facility], [address], [place], [place] [address]   MRSA Next Gen by PCR, Nasal     Status: None   Collection Time: 08/13/21  9:00 AM   Specimen: Nasal Mucosa; Nasal Swab  Result Value Ref Range Status   MRSA by PCR Next Gen NOT DETECTED NOT DETECTED Final    Comment: (NOTE) The GeneXpert MRSA Assay (FDA approved for NASAL specimens only), is one component of a comprehensive MRSA colonization surveillance program. It is not intended to diagnose MRSA infection nor to guide or monitor treatment for MRSA infections. Test performance is not FDA approved in patients less than 20 years old. Performed at [facility], [address] [place], [place] [address]     Radiology Reports CT HEAD WO CONTRAST (5MM)  Result Date: 08/14/2021 CLINICAL DATA:  Head trauma, moderate to severe EXAM: CT HEAD WITHOUT CONTRAST TECHNIQUE: Contiguous axial images were obtained from the base of the skull through the vertex without intravenous contrast. COMPARISON:  None. FINDINGS: Brain: No evidence of acute infarction, hemorrhage, cerebral edema, mass, mass effect, or  midline shift. Ventricles and sulci are within normal limits for age. No extra-axial fluid collection. Vascular: No hyperdense vessel or unexpected calcification. Skull: Normal. Negative for fracture or focal lesion. Sinuses/Orbits: No acute finding. Other: The mastoid air cells are well aerated. IMPRESSION: No acute intracranial process. Electronically Signed   By: Merilyn Baba M.D.   On: 08/14/2021 19:58   DG Chest Port 1 View  Result Date: 08/12/2021 CLINICAL DATA:  Chest pain and shortness of breath. EXAM: PORTABLE CHEST 1 VIEW COMPARISON:  Chest radiograph August 09, 2021. FINDINGS: Monitoring leads overlie the patient. Stable cardiac and mediastinal contours. No large area pulmonary consolidation. No pleural effusion or pneumothorax. Thoracic spine degenerative changes. Chronic right rib fractures. IMPRESSION: No active disease. Electronically Signed   By: Lovey Newcomer M.D.   On: 08/12/2021 16:45   DG Chest Port 1 View  Result Date: 08/09/2021 CLINICAL DATA:  Chest pain.  Shortness of breath. EXAM: PORTABLE CHEST 1 VIEW COMPARISON:  CT chest 11/18/2020 FINDINGS: The heart and mediastinal contours are within normal limits. No focal consolidation. No pulmonary edema. No pleural effusion. No pneumothorax. No acute osseous abnormality. IMPRESSION: No active disease. Electronically Signed   By: Iven Finn M.D.   On: 08/09/2021 18:31   ECHOCARDIOGRAM COMPLETE  Result Date: 08/13/2021    ECHOCARDIOGRAM REPORT   Patient Name:   SARIYAH CORCINO Date of Exam: 08/13/2021 Medical Rec #:  [id number]       Height:       66.0 in Accession #:    [id number]      Weight:       249.3 lb Date of Birth:  11-Oct-1966        BSA:          2.196 m Patient Age:    32 years        BP:           120/86 mmHg Patient Gender: F               HR:           86 bpm. Exam  Location:  [facility] Procedure: 2D Echo, Cardiac Doppler and Color Doppler Indications:    Atrial Fibrillation I48.91  History:        Patient has no prior  history of Echocardiogram examinations.                 Risk Factors:Hypertension. Alcohol abuse with intoxication                 ([place]), Alcohol withdrawal ([place]). Tobacco abuse.  Sonographer:    Alvino Chapel RCS Referring Phys: [phone] [facility]anne Chang [facility]  Sonographer Comments: Suboptimal subcostal window. IMPRESSIONS  1. Left ventricular ejection fraction, by estimation, is 60 to 65%. The left ventricle has normal function. The left ventricle has no regional wall motion abnormalities. Left ventricular diastolic parameters were normal.  2. Right ventricular systolic function is normal. The right ventricular size is normal. Tricuspid regurgitation signal is inadequate for assessing PA pressure.  3. The mitral valve is grossly normal. Trivial mitral valve regurgitation.  4. The aortic valve is tricuspid. Aortic valve regurgitation is not visualized.  5. Unable to estimate CVP.  6. Intermittent bowing of atrial septum from right to left without obvious shunt. Comparison(s): No prior Echocardiogram. FINDINGS  Left Ventricle: Left ventricular ejection fraction, by estimation, is 60 to 65%. The left ventricle has normal function. The left ventricle has no regional wall motion abnormalities. The left ventricular internal cavity size was normal in size. There is  borderline left ventricular hypertrophy. Left ventricular diastolic parameters were normal. Right Ventricle: The right ventricular size is normal. No increase in right ventricular wall thickness. Right ventricular systolic function is normal. Tricuspid regurgitation signal is inadequate for assessing PA pressure. Left Atrium: Left atrial size was normal in size. Right Atrium: Right atrial size was normal in size. Pericardium: There is no evidence of pericardial effusion. Mitral Valve: The mitral valve is grossly normal. Trivial mitral valve regurgitation. Tricuspid Valve: The tricuspid valve is grossly normal. Tricuspid valve regurgitation is trivial. Aortic Valve:  The aortic valve is tricuspid. Aortic valve regurgitation is not visualized. Pulmonic Valve: The pulmonic valve was grossly normal. Pulmonic valve regurgitation is trivial. Aorta: The aortic root is normal in size and structure. Venous: Unable to estimate CVP. The inferior vena cava was not well visualized. IAS/Shunts: No atrial level shunt detected by color flow Doppler.  LEFT VENTRICLE PLAX 2D LVIDd:         5.00 cm  Diastology LVIDs:         3.10 cm  LV e' medial:    7.29 cm/s LV PW:         1.10 cm  LV E/e' medial:  9.7 LV IVS:        0.90 cm  LV e' lateral:   13.10 cm/s LVOT diam:     1.90 cm  LV E/e' lateral: 5.4 LV SV:         72 LV SV Index:   33 LVOT Area:     2.84 cm  RIGHT VENTRICLE RV S prime:     12.10 cm/s TAPSE (M-mode): 1.8 cm LEFT ATRIUM             Index       RIGHT ATRIUM           Index LA diam:        3.00 cm 1.37 cm/m  RA Area:     16.10 cm LA Vol (A2C):   65.3 ml 29.73 ml/m RA Volume:   39.50 ml  17.99 ml/m LA Vol (A4C):   56.0 ml 25.50 ml/m LA Biplane Vol: 63.0 ml 28.68 ml/m  AORTIC VALVE LVOT Vmax:   132.00 cm/s LVOT Vmean:  82.800 cm/s LVOT VTI:    0.253 m  AORTA Ao Root diam: 3.60 cm MITRAL VALVE MV Area (PHT): 3.37 cm    SHUNTS MV Decel Time: 225 msec    Systemic VTI:  0.25 m MV E velocity: 70.40 cm/s  Systemic Diam: 1.90 cm MV A velocity: 46.20 cm/s MV E/A ratio:  1.52 Rozann Lesches MD Electronically signed by Rozann Lesches MD Signature Date/Time: 08/13/2021/11:22:04 AM    Final      CBC Recent Labs  Lab 08/12/21 1537 08/13/21 0401 08/15/21 0504 08/16/21 0517 08/17/21 0441  WBC 11.6* 11.1* 3.7* 4.0 4.9  HGB 13.4 12.9 10.6* 10.8* 10.5*  HCT 38.7 37.8 32.8* 33.1* 32.2*  PLT 244 219 136* 144* 134*  MCV 98.0 98.4 104.5* 102.2* 103.5*  MCH 33.9 33.6 33.8 33.3 33.8  MCHC 34.6 34.1 32.3 32.6 32.6  RDW 12.4 12.5 12.8 12.5 12.9  LYMPHSABS 2.3  --  1.1  --   --   MONOABS 0.4  --  0.4  --   --   EOSABS 0.0  --  0.3  --   --   BASOSABS 0.1  --  0.0  --   --      Chemistries  Recent Labs  Lab 08/12/21 1537 08/13/21 0401 08/14/21 0449 08/15/21 0504 08/16/21 0517 08/16/21 2115 08/17/21 0441  NA 120* 127* 130* 133* 139 135 139  K 4.1 3.6 2.9* 3.4* 3.2* 4.2 3.5  CL 89* 96* 101 104 108 105 106  CO2 11* 15* 20* 21* 25 23 26   GLUCOSE 74 116* 151* 129* 123* 111* 130*  BUN 20 14 9  5* <5* 6 9  CREATININE 1.16* 0.99 0.91 0.79 0.73 0.88 0.67  CALCIUM 7.9* 8.2* 8.2* 8.3* 8.6* 8.6* 8.4*  MG 1.6*  --  1.7 1.6* 1.5*  --   --   AST 192* 133* 136* 138* 134*  --  118*  ALT 70* 63* 60* 70* 79*  --  80*  ALKPHOS 172* 162* 127* 113 111  --  111  BILITOT 1.3* 1.9* 1.1 1.1 0.7  --  0.5   ------------------------------------------------------------------------------------------------------------------ No results for input(s): CHOL, HDL, LDLCALC, TRIG, CHOLHDL, LDLDIRECT in the last 72 hours.  Lab Results  Component Value Date   HGBA1C 5.5 03/16/2021   ------------------------------------------------------------------------------------------------------------------ No results for input(s): TSH, T4TOTAL, T3FREE, THYROIDAB in the last 72 hours.  Invalid input(s): FREET3 ------------------------------------------------------------------------------------------------------------------ No results for input(s): VITAMINB12, FOLATE, FERRITIN, TIBC, IRON, RETICCTPCT in the last 72 hours.  Coagulation profile No results for input(s): INR, PROTIME in the last 168 hours.  No results for input(s): DDIMER in the last 72 hours.  Cardiac Enzymes No results for input(s): CKMB, TROPONINI, MYOGLOBIN in the last 168 hours.  Invalid input(s): CK ------------------------------------------------------------------------------------------------------------------    Component Value Date/Time   BNP 22.0 08/12/2021 1537     Carleta Woodrow M.D on 08/17/2021 at 6:19 PM  Go to [email or web address] - for contact info  [facility] - Office  [phone]

## 2021-08-17 NOTE — TOC Progression Note (Signed)
Transition of Care [facility]) - Progression Note    Patient Details  Name: Renee [name] MRN: [id number] Date of Birth: 1966-02-07  [facility] CM/SW Contact  Shade Flood, LCSW Phone Number: 08/17/2021, 9:22 AM  Clinical Narrative:     TOC following. Spoke with admissions at [facility]. They have reviewed the clinical faxed by this LCSW yesterday afternoon. At this time, they would like to see pt receive one more day of detox care here at the hospital and they have her tentatively on the admission list for tomorrow with an intake time of 1pm.  They would like to see RN documentation today and tomorrow AM of how pt is doing with her ADLs, ambulation, and balance. They would also like her to be not needing Haldol or Ativan.   This LCSW to fax updated clinical at the end of the day today and again tomorrow AM and then the admissions team at [facility] will determine if pt stable enough to come for the 1pm admission.  Will update MD and RN. TOC will follow.   Expected Discharge Plan: Home/Self Care Barriers to Discharge: Continued Medical Work up  Expected Discharge Plan and Services Expected Discharge Plan: Home/Self Care       Living arrangements for the past 2 months: Single Family Home Expected Discharge Date: 08/17/21                                     Social Determinants of Health (SDOH) Interventions    Readmission Risk Interventions Readmission Risk Prevention Plan 08/15/2021  Medication Screening Complete  Transportation Screening Complete  Some recent data might be hidden

## 2021-08-17 NOTE — Progress Notes (Signed)
Pt ambulates to the toilet in her room independently and tolerates well. Pt ambulated approx. 400 feet in hallway with spouse and tolerated well. Pt is able to feed and dress herself. Will continue to monitor ADL progress throughout the day.

## 2021-08-18 LAB — COMPREHENSIVE METABOLIC PANEL
ALT: 71 U/L — ABNORMAL HIGH (ref 0–44)
AST: 90 U/L — ABNORMAL HIGH (ref 15–41)
Albumin: 3.2 g/dL — ABNORMAL LOW (ref 3.5–5.0)
Alkaline Phosphatase: 105 U/L (ref 38–126)
Anion gap: 8 (ref 5–15)
BUN: 13 mg/dL (ref 6–20)
CO2: 26 mmol/L (ref 22–32)
Calcium: 8.3 mg/dL — ABNORMAL LOW (ref 8.9–10.3)
Chloride: 104 mmol/L (ref 98–111)
Creatinine, Ser: 0.63 mg/dL (ref 0.44–1.00)
GFR, Estimated: >60 mL/min (ref >60)
Glucose, Bld: 98 mg/dL (ref 70–99)
Potassium: 3.5 mmol/L (ref 3.5–5.1)
Sodium: 138 mmol/L (ref 135–145)
Total Bilirubin: 0.7 mg/dL (ref 0.3–1.2)
Total Protein: 5.8 g/dL — ABNORMAL LOW (ref 6.5–8.1)

## 2021-08-18 LAB — MAGNESIUM: Magnesium: 1.2 mg/dL — ABNORMAL LOW (ref 1.7–2.4)

## 2021-08-18 MED ORDER — MAGNESIUM SULFATE 2 GM/50ML IV SOLN
2.0000 g | Freq: Once | INTRAVENOUS | Status: AC
  Administered 2021-08-18: 2 g via INTRAVENOUS
  Filled 2021-08-18: qty 50

## 2021-08-18 MED ORDER — HALOPERIDOL LACTATE 5 MG/ML IJ SOLN
2.0000 mg | Freq: Once | INTRAMUSCULAR | Status: AC
  Administered 2021-08-18: 2 mg via INTRAMUSCULAR
  Filled 2021-08-18: qty 1

## 2021-08-18 MED ORDER — CHLORDIAZEPOXIDE HCL 25 MG PO CAPS: 25.0000 mg | ORAL_CAPSULE | Freq: Three times a day (TID) | ORAL | 0 refills | Status: DC

## 2021-08-18 MED ORDER — ADULT MULTIVITAMIN W/MINERALS CH: 1.0000 | ORAL_TABLET | Freq: Every day | ORAL | 1 refills | Status: DC

## 2021-08-18 MED ORDER — POTASSIUM CHLORIDE CRYS ER 20 MEQ PO TBCR
40.0000 meq | EXTENDED_RELEASE_TABLET | Freq: Once | ORAL | Status: AC
  Administered 2021-08-18: 40 meq via ORAL
  Filled 2021-08-18: qty 2

## 2021-08-18 MED ORDER — CHLORTHALIDONE 25 MG PO TABS: 12.5000 mg | ORAL_TABLET | Freq: Every day | ORAL | 1 refills | Status: DC

## 2021-08-18 NOTE — Discharge Instructions (Signed)
Abstinence from alcohol advised

## 2021-08-18 NOTE — Progress Notes (Signed)
Nsg Discharge Note  Admit Date:  08/12/2021 Discharge date: 08/18/2021   [name] to be D/C'd  [facility]  per MD order.  AVS completed.  Copy for chart, and copy for patient signed, and dated. Patient/caregiver able to verbalize understanding.  Discharge Medication: Allergies as of 08/18/2021   Not on File      Medication List     STOP taking these medications    benazepril 20 MG tablet Commonly known as: LOTENSIN   benazepril 40 MG tablet Commonly known as: LOTENSIN   cloNIDine 0.1 MG tablet Commonly known as: CATAPRES   fluticasone 50 MCG/ACT nasal spray Commonly known as: FLONASE   melatonin 5 MG Tabs   polyethylene glycol-electrolytes 420 g solution Commonly known as: TriLyte   promethazine-dextromethorphan 6.25-15 MG/5ML syrup Commonly known as: PROMETHAZINE-DM       TAKE these medications    acetaminophen 500 MG tablet Commonly known as: TYLENOL Take 1,000 mg by mouth every 6 (six) hours as needed for mild pain or headache.   atenolol 50 MG tablet Commonly known as: TENORMIN Take 1 tablet (50 mg total) by mouth daily.   chlordiazePOXIDE 25 MG capsule Commonly known as: LIBRIUM Take 1 capsule (25 mg total) by mouth 3 (three) times daily.   chlorthalidone 25 MG tablet Commonly known as: HYGROTON Take 0.5 tablets (12.5 mg total) by mouth daily. What changed: how much to take   esomeprazole 20 MG capsule Commonly known as: NEXIUM Take 20 mg by mouth daily.   ibuprofen 200 MG tablet Commonly known as: ADVIL Take 800 mg by mouth every 6 (six) hours as needed for mild pain or moderate pain (Back pain).   multivitamin with minerals Tabs tablet Take 1 tablet by mouth daily. Start taking on: August 19, 2021   PARoxetine 20 MG tablet Commonly known as: PAXIL Take 1 tablet (20 mg total) by mouth daily.   rosuvastatin 5 MG tablet Commonly known as: Crestor Take 1 tablet (5 mg total) by mouth daily.   traZODone 100 MG  tablet Commonly known as: DESYREL Take 1 tablet (100 mg total) by mouth at bedtime as needed for sleep.   vitamin B-12 1000 MCG tablet Commonly known as: CYANOCOBALAMIN Take 1,000 mcg by mouth daily.        Discharge Assessment: Vitals:   08/18/21 0700 08/18/21 0851  BP:  129/89  Pulse:    Resp:    Temp: 98.2 F (36.8 C)   SpO2:     Skin clean, dry and intact without evidence of skin break down, no evidence of skin tears noted. IV catheter discontinued intact. Site without signs and symptoms of complications - no redness or edema noted at insertion site, patient denies c/o pain - only slight tenderness at site.  Dressing with slight pressure applied.  D/c Instructions-Education: Discharge instructions given to patient/family with verbalized understanding. D/c education completed with patient/family including follow up instructions, medication list, d/c activities limitations if indicated, with other d/c instructions as indicated by MD - patient able to verbalize understanding, all questions fully answered. Patient instructed to return to ED, call 911, or call MD for any changes in condition.  Patient escorted via [facility], and D/C to [facility] via private auto.  Carney Corners, RN 08/18/2021 12:02 PM

## 2021-08-18 NOTE — Discharge Summary (Signed)
Renee [name], is a 55 y.o. female  DOB [birth year]  MRN [id number].  Admission date:  08/12/2021  Admitting Physician  Bethena Roys, MD  Discharge Date:  08/18/2021   Primary MD  Lindell Spar, MD  Recommendations for primary care physician for things to follow:   -Abstinence from alcohol advised   Admission Diagnosis  Alcohol withdrawal ([place]) [S89.791] Alcoholic ketoacidosis [R04.13] Hyponatremia [E87.1] Atrial fibrillation with rapid ventricular response ([place]) [I48.91]   Discharge Diagnosis  Alcohol withdrawal ([place]) [S43.837] Alcoholic ketoacidosis [R93.96] Hyponatremia [E87.1] Atrial fibrillation with rapid ventricular response ([place]) [I48.91]    Principal Problem:   Hyponatremia Active Problems:   Alcohol abuse with intoxication ([place])   Alcohol withdrawal ([place])   Alcoholic ketoacidosis   Atrial fibrillation with rapid ventricular response ([place])      Past Medical History:  Diagnosis Date   Alcohol abuse    Depression    Hypertension    PAF (paroxysmal atrial fibrillation) ([place])     Past Surgical History:  Procedure Laterality Date   ABDOMINAL HYSTERECTOMY N/A    CHOLECYSTECTOMY     COLONOSCOPY N/A 01/26/2021   Procedure: COLONOSCOPY;  Surgeon: Rogene Houston, MD;  Location: [facility];  Service: Endoscopy;  Laterality: N/A;  AM   SMALL INTESTINE SURGERY N/A    TONSILLECTOMY        HPI  from the history and physical done on the day of admission:     Chief Complaint: Alcoholism   HPI: Renee [name] is a 55 y.o. female with medical history significant for obesity, hypertension, depression, atrial fibrillation. Patient presented to the ED with complaints of heavy drinking over the past 6 days.  Patient has not eaten anything in the past 2 days.  Spouse is present at her bed side.  Patient has tried unsuccessfully to quit drinking alcohol prior to this.  Last  drink was about 12 hours ago, between 8 and 10 AM this morning.  She drinks multiple bottles of wine daily.  She reports daily diarrhea up to 4 times daily over the past few weeks.   She also reports central to left-sided chest pain, not related to activity, with associated difficulty breathing, that may be secondary to panic attacks, which the husband confirms she does have.  Quit smoking cigarettes about 5 years ago.  No known family history of heart attacks.  Patient was admitted about 5 weeks ago when he traveled to the beach, patient had electrolyte abnormalities, dehydration and atrial fibrillation with hypotension.  She also had withdrawal from alcohol. She denies history of seizures.  Denies hallucinations at this time.   Patient is scheduled to start rehab at a facility-[facility] in [place] on Tuesday, 4th of October.   ED Course: Temperature 98.1.  Heart rate 87 - 100.  Blood pressure systolic 886Y to 847U.  Sodium 120.  Blood alcohol level 177.  Potassium 4.1.  Troponin 4.  Glucose 74.  Anion gap 20, serum bicarb 11. Ativan given in ED, dextrose containing  fluids started.  2 L bolus given.  Hospitalist to admit.   Review of Systems: As per HPI all other systems reviewed and negative.    Hospital Course:   Brief Narrative: [name] is a 55 year old female who presents with recent heavy drinking and seeking medical assistance in quitting. Hx of obesity, htn, depression, and consuming several bottles of wine daily. She is currently agreeable, anxious, reports visual hallucinations. UDS without unexpected findings. She reports numerous loose stools, no infectious cause suspected, fell while attempting to use the bedside toilet, head ct was without acute findings. She has a hx of afib, not currently anticoagulated due to fall risk and alcohol use. Once she has passed the acute withdrawal phase, the plan is to go to [facility], which she asks about stopping by home first, and I  have emphasized that she should go directly there and have her husband bring her clothes.     Assessment & Plan:       Hyponatremia secondary to chronic alcohol abuse -initial sodium 120   -Sodium has normalized       Alcohol abuse with intoxication/DTs -Delirium tremens has resolved, no longer requiring lorazepam -Patient is Now medically stable for transfer to-[facility] alcohol rehab -c/n folic acid, thiamine, and multivitamin -c/n librium tid -Patient is appropriate and cooperative     Alcoholic ketoacidosis -initial anion gap acidosis at 20, resolved     Atrial fibrillation with rapid ventricular response -initially in atrial fib, was placed on diltiazem with conversion to sinus rhythm. -currently sinus rhythm, atenolol - currently not on anticoagulants due to alcohol use, risk of fall and head trauma. -CHA2DS2-VASc score is 3 -If able to stay sober/abstinent from alcohol PCP may consider anticoagulation for A. fib   S/p Fall 08/14/21,--- patient fell while trying to get a bedside commode while having alcohol withdrawal--possibly struck head, CT head without acute finding -Patient's gait is Now steady, ambulating hallways without assistance     Transaminitis--consistent with alcoholic hepatitis--- improving with cessation of alcohol  Hepatic Function Latest Ref Rng & Units 08/18/2021 08/17/2021 08/16/2021  Total Protein 6.5 - 8.1 g/dL 5.8(L) 5.7(L) 6.1(L)  Albumin 3.5 - 5.0 g/dL 3.2(L) 3.2(L) 3.3(L)  AST 15 - 41 U/L 90(H) 118(H) 134(H)  ALT 0 - 44 U/L 71(H) 80(H) 79(H)  Alk Phosphatase 38 - 126 U/L 105 111 111  Total Bilirubin 0.3 - 1.2 mg/dL 0.7 0.5 0.7   Hypokalemia/hypomagnesemia----EtOH related -Replaced and normalized   Macrocytic Anemia/pancytopenia--related to EtOH abuse -Overall improved with cessation of alcohol -multivitamin, folate, thiamine      Depression -on home paroxetine 82m qd, trazodone nightly as needed     Diarrhea -non inflammatory, no  blood, no mucous -no suspicion for infectious cause or cdiff -Improved    Disposition---[facility] detox/alcohol rehab facility   Code Status: full Family Communication: D/w Husband at bedside Disposition Plan: [facility] 08/18/21    Consultants:  cardiology   Procedures:  08/14/21--Head ct - post fall- no acute findings   Disposition: The patient is from: Home              Anticipated d/c is to:  [facility] detox program/alcohol rehab              Patient currently is medically stable to d/c. Code Status :  -  Code Status: Full Code    Family Communication:   (patient is alert, awake and coherent)  -Husband at bedside  Discharge Condition: stable  Follow UP--PCP   Diet and Activity recommendation:  As advised  Discharge Instructions    Discharge Instructions     Call MD for:  difficulty breathing, headache or visual disturbances   Complete by: As directed    Call MD for:  persistant dizziness or light-headedness   Complete by: As directed    Call MD for:  persistant nausea and vomiting   Complete by: As directed    Call MD for:  temperature >100.4   Complete by: As directed    Diet - low sodium heart healthy   Complete by: As directed    Discharge instructions   Complete by: As directed    1) abstinence from alcohol advised   Increase activity slowly   Complete by: As directed          Discharge Medications     Allergies as of 08/18/2021   Not on File      Medication List     STOP taking these medications    benazepril 20 MG tablet Commonly known as: LOTENSIN   benazepril 40 MG tablet Commonly known as: LOTENSIN   cloNIDine 0.1 MG tablet Commonly known as: CATAPRES   fluticasone 50 MCG/ACT nasal spray Commonly known as: FLONASE   melatonin 5 MG Tabs   polyethylene glycol-electrolytes 420 g solution Commonly known as: TriLyte   promethazine-dextromethorphan 6.25-15 MG/5ML syrup Commonly known as: PROMETHAZINE-DM        TAKE these medications    acetaminophen 500 MG tablet Commonly known as: TYLENOL Take 1,000 mg by mouth every 6 (six) hours as needed for mild pain or headache.   atenolol 50 MG tablet Commonly known as: TENORMIN Take 1 tablet (50 mg total) by mouth daily.   chlordiazePOXIDE 25 MG capsule Commonly known as: LIBRIUM Take 1 capsule (25 mg total) by mouth 3 (three) times daily.   chlorthalidone 25 MG tablet Commonly known as: HYGROTON Take 0.5 tablets (12.5 mg total) by mouth daily. What changed: how much to take   esomeprazole 20 MG capsule Commonly known as: NEXIUM Take 20 mg by mouth daily.   ibuprofen 200 MG tablet Commonly known as: ADVIL Take 800 mg by mouth every 6 (six) hours as needed for mild pain or moderate pain (Back pain).   multivitamin with minerals Tabs tablet Take 1 tablet by mouth daily. Start taking on: August 19, 2021   PARoxetine 20 MG tablet Commonly known as: PAXIL Take 1 tablet (20 mg total) by mouth daily.   rosuvastatin 5 MG tablet Commonly known as: Crestor Take 1 tablet (5 mg total) by mouth daily.   traZODone 100 MG tablet Commonly known as: DESYREL Take 1 tablet (100 mg total) by mouth at bedtime as needed for sleep.   vitamin B-12 1000 MCG tablet Commonly known as: CYANOCOBALAMIN Take 1,000 mcg by mouth daily.        Major procedures and Radiology Reports - PLEASE review detailed and final reports for all details, in brief -    CT HEAD WO CONTRAST (5MM)  Result Date: 08/14/2021 CLINICAL DATA:  Head trauma, moderate to severe EXAM: CT HEAD WITHOUT CONTRAST TECHNIQUE: Contiguous axial images were obtained from the base of the skull through the vertex without intravenous contrast. COMPARISON:  None. FINDINGS: Brain: No evidence of acute infarction, hemorrhage, cerebral edema, mass, mass effect, or midline shift. Ventricles and sulci are within normal limits for age. No extra-axial fluid collection. Vascular: No hyperdense vessel or  unexpected calcification. Skull: Normal. Negative for fracture  or focal lesion. Sinuses/Orbits: No acute finding. Other: The mastoid air cells are well aerated. IMPRESSION: No acute intracranial process. Electronically Signed   By: Merilyn Baba M.D.   On: 08/14/2021 19:58   DG Chest Port 1 View  Result Date: 08/12/2021 CLINICAL DATA:  Chest pain and shortness of breath. EXAM: PORTABLE CHEST 1 VIEW COMPARISON:  Chest radiograph August 09, 2021. FINDINGS: Monitoring leads overlie the patient. Stable cardiac and mediastinal contours. No large area pulmonary consolidation. No pleural effusion or pneumothorax. Thoracic spine degenerative changes. Chronic right rib fractures. IMPRESSION: No active disease. Electronically Signed   By: Lovey Newcomer M.D.   On: 08/12/2021 16:45   DG Chest Port 1 View  Result Date: 08/09/2021 CLINICAL DATA:  Chest pain.  Shortness of breath. EXAM: PORTABLE CHEST 1 VIEW COMPARISON:  CT chest 11/18/2020 FINDINGS: The heart and mediastinal contours are within normal limits. No focal consolidation. No pulmonary edema. No pleural effusion. No pneumothorax. No acute osseous abnormality. IMPRESSION: No active disease. Electronically Signed   By: Iven Finn M.D.   On: 08/09/2021 18:31   ECHOCARDIOGRAM COMPLETE  Result Date: 08/13/2021    ECHOCARDIOGRAM REPORT   Patient Name:   ARBADELLA KIMBLER Date of Exam: 08/13/2021 Medical Rec #:  [id number]       Height:       66.0 in Accession #:    [id number]      Weight:       249.3 lb Date of Birth:  06/09/1966        BSA:          2.196 m Patient Age:    56 years        BP:           120/86 mmHg Patient Gender: F               HR:           86 bpm. Exam Location:  [facility] Procedure: 2D Echo, Cardiac Doppler and Color Doppler Indications:    Atrial Fibrillation I48.91  History:        Patient has no prior history of Echocardiogram examinations.                 Risk Factors:Hypertension. Alcohol abuse with intoxication                 ([place]),  Alcohol withdrawal ([place]). Tobacco abuse.  Sonographer:    Alvino Chapel RCS Referring Phys: [phone] Leanne Chang [facility]  Sonographer Comments: Suboptimal subcostal window. IMPRESSIONS  1. Left ventricular ejection fraction, by estimation, is 60 to 65%. The left ventricle has normal function. The left ventricle has no regional wall motion abnormalities. Left ventricular diastolic parameters were normal.  2. Right ventricular systolic function is normal. The right ventricular size is normal. Tricuspid regurgitation signal is inadequate for assessing PA pressure.  3. The mitral valve is grossly normal. Trivial mitral valve regurgitation.  4. The aortic valve is tricuspid. Aortic valve regurgitation is not visualized.  5. Unable to estimate CVP.  6. Intermittent bowing of atrial septum from right to left without obvious shunt. Comparison(s): No prior Echocardiogram. FINDINGS  Left Ventricle: Left ventricular ejection fraction, by estimation, is 60 to 65%. The left ventricle has normal function. The left ventricle has no regional wall motion abnormalities. The left ventricular internal cavity size was normal in size. There is  borderline left ventricular hypertrophy. Left ventricular diastolic parameters were normal. Right Ventricle: The right ventricular size is  normal. No increase in right ventricular wall thickness. Right ventricular systolic function is normal. Tricuspid regurgitation signal is inadequate for assessing PA pressure. Left Atrium: Left atrial size was normal in size. Right Atrium: Right atrial size was normal in size. Pericardium: There is no evidence of pericardial effusion. Mitral Valve: The mitral valve is grossly normal. Trivial mitral valve regurgitation. Tricuspid Valve: The tricuspid valve is grossly normal. Tricuspid valve regurgitation is trivial. Aortic Valve: The aortic valve is tricuspid. Aortic valve regurgitation is not visualized. Pulmonic Valve: The pulmonic valve was grossly normal.  Pulmonic valve regurgitation is trivial. Aorta: The aortic root is normal in size and structure. Venous: Unable to estimate CVP. The inferior vena cava was not well visualized. IAS/Shunts: No atrial level shunt detected by color flow Doppler.  LEFT VENTRICLE PLAX 2D LVIDd:         5.00 cm  Diastology LVIDs:         3.10 cm  LV e' medial:    7.29 cm/s LV PW:         1.10 cm  LV E/e' medial:  9.7 LV IVS:        0.90 cm  LV e' lateral:   13.10 cm/s LVOT diam:     1.90 cm  LV E/e' lateral: 5.4 LV SV:         72 LV SV Index:   33 LVOT Area:     2.84 cm  RIGHT VENTRICLE RV S prime:     12.10 cm/s TAPSE (M-mode): 1.8 cm LEFT ATRIUM             Index       RIGHT ATRIUM           Index LA diam:        3.00 cm 1.37 cm/m  RA Area:     16.10 cm LA Vol (A2C):   65.3 ml 29.73 ml/m RA Volume:   39.50 ml  17.99 ml/m LA Vol (A4C):   56.0 ml 25.50 ml/m LA Biplane Vol: 63.0 ml 28.68 ml/m  AORTIC VALVE LVOT Vmax:   132.00 cm/s LVOT Vmean:  82.800 cm/s LVOT VTI:    0.253 m  AORTA Ao Root diam: 3.60 cm MITRAL VALVE MV Area (PHT): 3.37 cm    SHUNTS MV Decel Time: 225 msec    Systemic VTI:  0.25 m MV E velocity: 70.40 cm/s  Systemic Diam: 1.90 cm MV A velocity: 46.20 cm/s MV E/A ratio:  1.52 Rozann Lesches MD Electronically signed by Rozann Lesches MD Signature Date/Time: 08/13/2021/11:22:04 AM    Final     Micro Results   Recent Results (from the past 240 hour(s))  SARS CORONAVIRUS 2 (TAT 6-24 HRS) Nasopharyngeal Nasopharyngeal Swab     Status: None   Collection Time: 08/12/21  8:35 PM   Specimen: Nasopharyngeal Swab  Result Value Ref Range Status   SARS Coronavirus 2 NEGATIVE NEGATIVE Final    Comment: (NOTE) SARS-CoV-2 target nucleic acids are NOT DETECTED.  The SARS-CoV-2 RNA is generally detectable in upper and lower respiratory specimens during the acute phase of infection. Negative results do not preclude SARS-CoV-2 infection, do not rule out co-infections with other pathogens, and should not be used as  the sole basis for treatment or other patient management decisions. Negative results must be combined with clinical observations, patient history, and epidemiological information. The expected result is Negative.  Fact Sheet for Patients: SugarRoll.be  Fact Sheet for Healthcare Providers: https://www.woods-mathews.com/  This test is not yet  approved or cleared by the [place] and  has been authorized for detection and/or diagnosis of SARS-CoV-2 by FDA under an Emergency Use Authorization (EUA). This EUA will remain  in effect (meaning this test can be used) for the duration of the COVID-19 declaration under Se ction 564(b)(1) of the Act, 21 U.S.C. section 360bbb-3(b)(1), unless the authorization is terminated or revoked sooner.  Performed at [facility], [place] [address], [place], [place] [address]   Resp Panel by RT-PCR (Flu A&B, Covid) Nasopharyngeal Swab     Status: None   Collection Time: 08/13/21  1:25 AM   Specimen: Nasopharyngeal Swab; Nasopharyngeal(NP) swabs in vial transport medium  Result Value Ref Range Status   SARS Coronavirus 2 by RT PCR NEGATIVE NEGATIVE Final    Comment: (NOTE) SARS-CoV-2 target nucleic acids are NOT DETECTED.  The SARS-CoV-2 RNA is generally detectable in upper respiratory specimens during the acute phase of infection. The lowest concentration of SARS-CoV-2 viral copies this assay can detect is 138 copies/mL. A negative result does not preclude SARS-Cov-2 infection and should not be used as the sole basis for treatment or other patient management decisions. A negative result may occur with  improper specimen collection/handling, submission of specimen other than nasopharyngeal swab, presence of viral mutation(s) within the areas targeted by this assay, and inadequate number of viral copies(<138 copies/mL). A negative result must be combined with clinical observations, patient history, and  epidemiological information. The expected result is Negative.  Fact Sheet for Patients:  EntrepreneurPulse.com.au  Fact Sheet for Healthcare Providers:  IncredibleEmployment.be  This test is no t yet approved or cleared by the [place] FDA and  has been authorized for detection and/or diagnosis of SARS-CoV-2 by FDA under an Emergency Use Authorization (EUA). This EUA will remain  in effect (meaning this test can be used) for the duration of the COVID-19 declaration under Section 564(b)(1) of the Act, 21 U.S.C.section 360bbb-3(b)(1), unless the authorization is terminated  or revoked sooner.       Influenza A by PCR NEGATIVE NEGATIVE Final   Influenza B by PCR NEGATIVE NEGATIVE Final    Comment: (NOTE) The Xpert Xpress SARS-CoV-2/FLU/RSV plus assay is intended as an aid in the diagnosis of influenza from Nasopharyngeal swab specimens and should not be used as a sole basis for treatment. Nasal washings and aspirates are unacceptable for Xpert Xpress SARS-CoV-2/FLU/RSV testing.  Fact Sheet for Patients: EntrepreneurPulse.com.au  Fact Sheet for Healthcare Providers: IncredibleEmployment.be  This test is not yet approved or cleared by the [place] FDA and has been authorized for detection and/or diagnosis of SARS-CoV-2 by FDA under an Emergency Use Authorization (EUA). This EUA will remain in effect (meaning this test can be used) for the duration of the COVID-19 declaration under Section 564(b)(1) of the Act, 21 U.S.C. section 360bbb-3(b)(1), unless the authorization is terminated or revoked.  Performed at [facility], [address] [place], [place] [address]   MRSA Next Gen by PCR, Nasal     Status: None   Collection Time: 08/13/21  9:00 AM   Specimen: Nasal Mucosa; Nasal Swab  Result Value Ref Range Status   MRSA by PCR Next Gen NOT DETECTED NOT DETECTED Final    Comment: (NOTE) The  GeneXpert MRSA Assay (FDA approved for NASAL specimens only), is one component of a comprehensive MRSA colonization surveillance program. It is not intended to diagnose MRSA infection nor to guide or monitor treatment for MRSA infections. Test performance is not FDA approved  in patients less than 67 years old. Performed at [facility], [address] [place], [place] [address]        Today   Subjective    Deetya Drouillard today has no New complaints  -Patient continues to be independent with ADLs, gait is steady, ambulating, more coherent, pleasant and cooperative   Patient has been seen and examined prior to discharge   Objective   Blood pressure 129/89, pulse 82, temperature 98.2 F (36.8 C), temperature source Oral, resp. rate 16, height 5' 6"  (1.676 m), weight 113.1 kg, SpO2 98 %.  No intake or output data in the 24 hours ending 08/18/21 1047  Exam Gen:- Awake Alert, no acute distress, pleasant and cooperative HEENT:- [place].AT, No sclera icterus Neck-Supple Neck,No JVD,.  Lungs-  CTAB , good air movement bilaterally  CV- S1, S2 normal, regular Abd-  +ve B.Sounds, Abd Soft, No tenderness,    Extremity/Skin:- No  edema,   good pulses Psych-affect is appropriate, oriented x3 Neuro-no new focal deficits,  mostly resolved tremors    Data Review   CBC w Diff:  Lab Results  Component Value Date   WBC 4.9 08/17/2021   HGB 10.5 (L) 08/17/2021   HGB 13.0 12/15/2020   HCT 32.2 (L) 08/17/2021   HCT 38.5 12/15/2020   PLT 134 (L) 08/17/2021   PLT 273 12/15/2020   LYMPHOPCT 29 08/15/2021   MONOPCT 11 08/15/2021   EOSPCT 9 08/15/2021   BASOPCT 1 08/15/2021    CMP:  Lab Results  Component Value Date   NA 138 08/18/2021   NA 138 12/15/2020   K 3.5 08/18/2021   CL 104 08/18/2021   CO2 26 08/18/2021   BUN 13 08/18/2021   BUN 28 (H) 12/15/2020   CREATININE 0.63 08/18/2021   PROT 5.8 (L) 08/18/2021   PROT 6.9 12/15/2020   ALBUMIN 3.2 (L) 08/18/2021   ALBUMIN 4.6  12/15/2020   BILITOT 0.7 08/18/2021   BILITOT 0.3 12/15/2020   ALKPHOS 105 08/18/2021   AST 90 (H) 08/18/2021   ALT 71 (H) 08/18/2021  .   Total Discharge time is about 33 minutes  Roxan Hockey M.D on 08/18/2021 at 10:47 AM  Go to [email or web address] -  for contact info  [facility] - Office  [phone]

## 2021-08-18 NOTE — TOC Transition Note (Signed)
Transition of Care [facility]) - CM/SW Discharge Note   Patient Details  Name: Renee [name] MRN: [id number] Date of Birth: [birth year]  Transition of [facility] CM/SW Contact:  Shade Flood, LCSW Phone Number: 08/18/2021, 10:49 AM   Clinical Narrative:     Pt stable for dc today per MD. Contacted Admissions at [facility]. Requested information will be faxed when available. Pt's husband to transport for 1pm appointment at [facility].  TOC consult for substance abuse treatment resources not addressed as pt is discharging to a substance abuse treatment center and their staff can follow on this need.  Final next level of care: Other (comment) Barriers to Discharge: Barriers Resolved   Patient Goals and CMS Choice Patient states their goals for this hospitalization and ongoing recovery are:: to [facility].gov Compare Post Acute Care list provided to:: Patient Choice offered to / list presented to : Patient  Discharge Placement                       Discharge Plan and Services                                     Social Determinants of Health (SDOH) Interventions     Readmission Risk Interventions Readmission Risk Prevention Plan 08/15/2021  Medication Screening Complete  Transportation Screening Complete  Some recent data might be hidden

## 2021-08-21 LAB — VITAMIN B1: Vitamin B1 (Thiamine): 205.7 nmol/L — ABNORMAL HIGH (ref 66.5–200.0)

## 2021-08-23 ENCOUNTER — Inpatient Hospital Stay: Payer: 59 | Admitting: Internal Medicine

## 2021-08-23 ENCOUNTER — Ambulatory Visit (INDEPENDENT_AMBULATORY_CARE_PROVIDER_SITE_OTHER): Payer: 59 | Admitting: Family Medicine

## 2021-08-23 ENCOUNTER — Encounter: Payer: Self-pay | Admitting: Family Medicine

## 2021-08-23 ENCOUNTER — Other Ambulatory Visit: Payer: Self-pay

## 2021-08-23 VITALS — BP 110/68 | HR 64 | Resp 18 | Ht 66.0 in | Wt 258.0 lb

## 2021-08-23 DIAGNOSIS — N3000 Acute cystitis without hematuria: Secondary | ICD-10-CM

## 2021-08-23 DIAGNOSIS — R5383 Other fatigue: Secondary | ICD-10-CM

## 2021-08-23 DIAGNOSIS — F10129 Alcohol abuse with intoxication, unspecified: Secondary | ICD-10-CM

## 2021-08-23 DIAGNOSIS — F322 Major depressive disorder, single episode, severe without psychotic features: Secondary | ICD-10-CM

## 2021-08-23 DIAGNOSIS — Z23 Encounter for immunization: Secondary | ICD-10-CM | POA: Diagnosis not present

## 2021-08-23 LAB — POCT URINALYSIS DIP (CLINITEK)
Bilirubin, UA: NEGATIVE
Blood, UA: NEGATIVE
Glucose, UA: 100 mg/dL — AB
Ketones, POC UA: NEGATIVE mg/dL
Nitrite, UA: POSITIVE — AB
POC PROTEIN,UA: NEGATIVE
Spec Grav, UA: 1.025 (ref 1.010–1.025)
Urobilinogen, UA: 1 E.U./dL
pH, UA: 6 (ref 5.0–8.0)

## 2021-08-23 MED ORDER — PAROXETINE HCL 40 MG PO TABS: 40.0000 mg | ORAL_TABLET | ORAL | 2 refills | Status: AC

## 2021-08-23 MED ORDER — SULFAMETHOXAZOLE-TRIMETHOPRIM 800-160 MG PO TABS: 1.0000 | ORAL_TABLET | Freq: Two times a day (BID) | ORAL | 0 refills | Status: DC

## 2021-08-23 MED ORDER — HYDROXYZINE PAMOATE 25 MG PO CAPS: ORAL_CAPSULE | ORAL | 1 refills | Status: DC

## 2021-08-23 NOTE — Patient Instructions (Addendum)
F/U with der Patel in 2 to 3 weeks  Increase paxil to 40 mg and new for sleep is hydroxyzine 1 to 2 at bedtime  You are referred urgently to Psychiatry  We will try to get you into an addiction program as soon as possible  Yes, alcohol is poison for you, do not plan to or take another drink since you cannot control drinkling once you start  You are being very brave and doing the right thing, getting the medical help that you need  You appear to have a uti , 5 day antibiotic course is prescribed ( septra) . Drink 64 ounces water daily and void often  Thanks for choosing [facility], we consider it a privelige to serve you.

## 2021-08-23 NOTE — Progress Notes (Signed)
   Renee [name]     MRN: [id number]      DOB: 1966-06-12   HPI Ms. Sulak is here following  admission from 10/01 to 08/18/2021 with a dx of alcohol withdrawal.Per hospital note she should have been admitted to in patient rehab facility on 10/04, however states when she went there post discharge she was no longer a candidate. Tearful, afraid, begging for help, states has no desire to drink , feels a total failure and wants to regain her life.  Was recently started on paxil by her PCP, Dr Posey Pronto prior to the admission, has in the past contemplated suicide, and spouse removed all guns and knives from the home 3 day h/o dysuria and frequency C/o poor exercise tolerance, wonders about therapy  ROS Denies recent fever or chills. Denies sinus pressure, nasal congestion, ear pain or sore throat. Denies chest congestion, productive cough or wheezing. Denies chest pains, palpitations and leg swelling Denies abdominal pain, nausea, vomiting,diarrhea or constipation.   . Denies joint pain, swelling and limitation in mobility. Denies headaches, seizures, numbness, or tingling. Denies skin break down or rash.   PE  BP 110/68   Pulse 64   Resp 18   Ht 5\' 6"  (1.676 m)   Wt 258 lb (117 kg)   SpO2 92%   BMI 41.64 kg/m   Patient alert and oriented and in no cardiopulmonary distress.  HEENT: No facial asymmetry, EOMI,     Neck supple .  Chest: Clear to auscultation bilaterally.  CVS: S1, S2 no murmurs, no S3.Regular rate.  ABD: Soft non tender.     MS: Adequate ROM spine, shoulders, hips and knees.  Skin: Intact, no ulcerations or rash noted.  Psych: Good eye contact, tearful,  anxious and depressed appearing.  CNS: CN 2-12 intact, power,  normal throughout.no focal deficits noted.   Assessment & Plan  Depression, major, single episode, severe (HCC) Increase paxil to 40 mg, change to hydroxyzine for sleep, urgent psych referral, and therapy  Alcohol abuse with intoxication  ([place]) alchol dependence needs in patient management, this was anticipated at time of recent d/c , woill need close follow up regarding management of her addiction. Will involve PCP in further management  Acute cystitis without hematuria Symptomatic, with abn CCUA, septra prescribed, f/u C/S  Fatigue Reports poor exercise tolerance and asks about therapy, to be further addressed by PCP

## 2021-08-24 ENCOUNTER — Telehealth: Payer: Self-pay

## 2021-08-24 ENCOUNTER — Encounter: Payer: Self-pay | Admitting: Family Medicine

## 2021-08-24 DIAGNOSIS — N3000 Acute cystitis without hematuria: Secondary | ICD-10-CM | POA: Insufficient documentation

## 2021-08-24 DIAGNOSIS — R5383 Other fatigue: Secondary | ICD-10-CM | POA: Insufficient documentation

## 2021-08-24 NOTE — Telephone Encounter (Signed)
Talked with Husband as a follow up today.  I have left 2 messages for Darina to call me.  We need to set her up for inpatient care if we can get her to agree to it.  I have asked him to have her call me.  She told me directly yesterday that I could talk with her husband about anything dealing with her care that is why I called him when I could not get her.  She need follow up with Dr Posey Pronto as well when she calls back.

## 2021-08-24 NOTE — Telephone Encounter (Signed)
Called pt to let her know we are referring her to inpatient Rehab.  [facility] [place] is a [facility].  [facility] said she can apply at that clinic again but they feel she needs [facility] Dx treatment.  I did give her a lot of AA information when she was in the clinic yesterday pm.  I have called her this am and left a message for her to call me. I did schedule her with Nichole yesterday but will talk with Shamyah about canceling that appt if we can get her inpatient care.

## 2021-08-24 NOTE — Assessment & Plan Note (Signed)
Symptomatic, with abn CCUA, septra prescribed, f/u C/S

## 2021-08-24 NOTE — Assessment & Plan Note (Signed)
alchol dependence needs in patient management, this was anticipated at time of recent d/c , woill need close follow up regarding management of her addiction. Will involve PCP in further management

## 2021-08-24 NOTE — Assessment & Plan Note (Signed)
Increase paxil to 40 mg, change to hydroxyzine for sleep, urgent psych referral, and therapy

## 2021-08-24 NOTE — Assessment & Plan Note (Signed)
Reports poor exercise tolerance and asks about therapy, to be further addressed by PCP

## 2021-08-28 ENCOUNTER — Telehealth: Payer: Self-pay

## 2021-08-28 NOTE — Telephone Encounter (Signed)
error 

## 2021-08-29 ENCOUNTER — Encounter: Payer: Self-pay | Admitting: Internal Medicine

## 2021-08-29 ENCOUNTER — Other Ambulatory Visit: Payer: Self-pay

## 2021-08-29 ENCOUNTER — Ambulatory Visit (INDEPENDENT_AMBULATORY_CARE_PROVIDER_SITE_OTHER): Payer: 59 | Admitting: Internal Medicine

## 2021-08-29 VITALS — BP 128/81 | HR 71 | Temp 98.1°F | Ht 66.0 in | Wt 251.0 lb

## 2021-08-29 DIAGNOSIS — I10 Essential (primary) hypertension: Secondary | ICD-10-CM

## 2021-08-29 DIAGNOSIS — F10129 Alcohol abuse with intoxication, unspecified: Secondary | ICD-10-CM | POA: Diagnosis not present

## 2021-08-29 DIAGNOSIS — Z8679 Personal history of other diseases of the circulatory system: Secondary | ICD-10-CM

## 2021-08-29 LAB — URINE CULTURE

## 2021-08-29 MED ORDER — ATENOLOL 50 MG PO TABS: 50.0000 mg | ORAL_TABLET | Freq: Every day | ORAL | 1 refills | Status: DC

## 2021-08-29 NOTE — Assessment & Plan Note (Signed)
BP Readings from Last 1 Encounters:  08/29/21 128/81   Well-controlled On atenolol 50 mg daily and chlorthalidone 25 mg daily Counseled for compliance with the medications Advised DASH diet and moderate exercise/walking, at least 150 mins/week

## 2021-08-29 NOTE — Patient Instructions (Signed)
Please continue to take medications as prescribed.  Please refrain from alcohol use.  You are being referred to Cardiology for history of atrial fibrillation.

## 2021-08-29 NOTE — Assessment & Plan Note (Signed)
  CHA2DS2-VASc Score = 3  Not on AC due to risk of fall in the setting of alcohol abuse history Could be due to alcohol withdrawal/DT, referred to Cardiology for further evaluation - may need cardiac monitor Since A Fib episode appears to be provoked in the setting of DT, would argue use of chronic AC

## 2021-08-29 NOTE — Assessment & Plan Note (Signed)
Has joined [employer] Will be seeing [facility] therapy

## 2021-08-29 NOTE — Progress Notes (Signed)
Acute Office Visit  Subjective:    Patient ID: Renee [name], female    DOB: 17-Oct-1966, 55 y.o.   MRN: [id number]  Chief Complaint  Patient presents with   Acute Visit    HPI Patient is in today for f/u alcohol withdrawal/abstinence and depression.  She has been doing well since last visit. She has not had alcohol for last 18 days. She denies any palpitations, tremors or dyspnea. Of note, she had episode of A Fib in the hospital and was started on Cardizem drip. She converted to sinus rhythm later, but was not discharged with [facility] due to risk of fall considering h/o alcohol abuse.  She has been sleeping well. She has joined [facility] and is going to see [facility] therapist. She is taking Paxil and PRN Vistaril. She denies any SI or HI.  She is interested in weight loss treatment. She prefers to avoid any stimulant. She asks about [facility].  Past Medical History:  Diagnosis Date   Alcohol abuse    Depression    Hypertension    PAF (paroxysmal atrial fibrillation) ([place])     Past Surgical History:  Procedure Laterality Date   ABDOMINAL HYSTERECTOMY N/A    CHOLECYSTECTOMY     COLONOSCOPY N/A 01/26/2021   Procedure: COLONOSCOPY;  Surgeon: Rogene Houston, MD;  Location[facility];  Service: Endoscopy;  Laterality: N/A;  AM   SMALL INTESTINE SURGERY N/A    TONSILLECTOMY      Family History  Problem Relation Age of Onset   Hypertension Father     Social History   Socioeconomic History   Marital status: Married    Spouse name: Not on file   Number of children: Not on file   Years of education: Not on file   Highest education level: Not on file  Occupational History   Not on file  Tobacco Use   Smoking status: Former    Types: Cigarettes    Quit date: 2018    Years since quitting: 4.7   Smokeless tobacco: Never   Tobacco comments:    35 pack-year  Vaping Use   Vaping Use: Never used  Substance and Sexual Activity   Alcohol use: Yes   Drug use: Never   Sexual  activity: Yes  Other Topics Concern   Not on file  Social History Narrative   Not on file   Social Determinants of Health   Financial Resource Strain: Not on file  Food Insecurity: Not on file  Transportation Needs: Not on file  Physical Activity: Not on file  Stress: Not on file  Social Connections: Not on file  Intimate Partner Violence: Not on file    Outpatient Medications Prior to Visit  Medication Sig Dispense Refill   acetaminophen (TYLENOL) 500 MG tablet Take 1,000 mg by mouth every 6 (six) hours as needed for mild pain or headache.     chlorthalidone (HYGROTON) 25 MG tablet Take 0.5 tablets (12.5 mg total) by mouth daily. 90 tablet 1   esomeprazole (NEXIUM) 20 MG capsule Take 20 mg by mouth daily.     hydrOXYzine (VISTARIL) 25 MG capsule One to two capsules by mouth at bedtime as needed, for sleep 30 capsule 1   Multiple Vitamin (MULTIVITAMIN WITH MINERALS) TABS tablet Take 1 tablet by mouth daily. 120 tablet 1   PARoxetine (PAXIL) 40 MG tablet Take 1 tablet (40 mg total) by mouth every morning. 30 tablet 2   rosuvastatin (CRESTOR) 5 MG tablet Take  1 tablet (5 mg total) by mouth daily. 90 tablet 3   sulfamethoxazole-trimethoprim (BACTRIM DS) 800-160 MG tablet Take 1 tablet by mouth 2 (two) times daily. 10 tablet 0   vitamin B-12 (CYANOCOBALAMIN) 1000 MCG tablet Take 1,000 mcg by mouth daily.     atenolol (TENORMIN) 50 MG tablet Take 1 tablet (50 mg total) by mouth daily. 90 tablet 1   chlordiazePOXIDE (LIBRIUM) 25 MG capsule Take 1 capsule (25 mg total) by mouth 3 (three) times daily. 15 capsule 0   No facility-administered medications prior to visit.    Allergies  Allergen Reactions   Librium [Chlordiazepoxide] Other (See Comments)    Does not like the way it makes her feel    Review of Systems  Constitutional:  Positive for fatigue. Negative for chills and fever.  HENT:  Negative for congestion, sinus pressure, sinus pain and sore throat.   Eyes:  Negative for  pain and discharge.  Respiratory:  Negative for cough and shortness of breath.   Cardiovascular:  Negative for chest pain and palpitations.  Gastrointestinal:  Negative for abdominal pain, constipation, diarrhea, nausea and vomiting.  Endocrine: Negative for polydipsia and polyuria.  Genitourinary:  Negative for dysuria and hematuria.  Musculoskeletal:  Negative for neck pain and neck stiffness.  Skin:  Negative for rash.  Neurological:  Negative for dizziness and weakness.  Psychiatric/Behavioral:  Positive for dysphoric mood and sleep disturbance. Negative for agitation and behavioral problems.       Objective:    Physical Exam Vitals reviewed.  Constitutional:      General: She is not in acute distress.    Appearance: She is obese. She is not diaphoretic.  HENT:     Head: Normocephalic and atraumatic.     Nose: Nose normal.     Mouth/Throat:     Mouth: Mucous membranes are moist.  Eyes:     General: No scleral icterus.    Extraocular Movements: Extraocular movements intact.  Cardiovascular:     Rate and Rhythm: Normal rate and regular rhythm.     Pulses: Normal pulses.     Heart sounds: Normal heart sounds. No murmur heard. Pulmonary:     Breath sounds: Normal breath sounds. No wheezing or rales.  Abdominal:     Palpations: Abdomen is soft.     Tenderness: There is no abdominal tenderness.  Musculoskeletal:     Cervical back: Neck supple. No tenderness.     Right lower leg: No edema.     Left lower leg: No edema.  Skin:    General: Skin is warm.     Findings: No rash.  Neurological:     General: No focal deficit present.     Mental Status: She is alert and oriented to person, place, and time.     Sensory: No sensory deficit.     Motor: No weakness.  Psychiatric:        Mood and Affect: Mood is depressed.        Behavior: Behavior normal.    BP 128/81 (BP Location: Left Arm, Patient Position: Sitting, Cuff Size: Normal)   Pulse 71   Temp 98.1 F (36.7 C)  (Oral)   Ht 5\' 6"  (1.676 m)   Wt 251 lb 0.6 oz (113.9 kg)   SpO2 97%   BMI 40.52 kg/m  Wt Readings from Last 3 Encounters:  08/29/21 251 lb 0.6 oz (113.9 kg)  08/23/21 258 lb (117 kg)  08/13/21 249 lb 5.4 oz (113.1 kg)  Health Maintenance Due  Topic Date Due   COVID-19 Vaccine (2 - Moderna series) 05/18/2020    There are no preventive care reminders to display for this patient.   Lab Results  Component Value Date   TSH 2.029 08/13/2021   Lab Results  Component Value Date   WBC 4.9 08/17/2021   HGB 10.5 (L) 08/17/2021   HCT 32.2 (L) 08/17/2021   MCV 103.5 (H) 08/17/2021   PLT 134 (L) 08/17/2021   Lab Results  Component Value Date   NA 138 08/18/2021   K 3.5 08/18/2021   CO2 26 08/18/2021   GLUCOSE 98 08/18/2021   BUN 13 08/18/2021   CREATININE 0.63 08/18/2021   BILITOT 0.7 08/18/2021   ALKPHOS 105 08/18/2021   AST 90 (H) 08/18/2021   ALT 71 (H) 08/18/2021   PROT 5.8 (L) 08/18/2021   ALBUMIN 3.2 (L) 08/18/2021   CALCIUM 8.3 (L) 08/18/2021   ANIONGAP 8 08/18/2021   No results found for: CHOL No results found for: HDL No results found for: LDLCALC No results found for: TRIG No results found for: CHOLHDL Lab Results  Component Value Date   HGBA1C 5.5 03/16/2021       Assessment & Plan:   Problem List Items Addressed This Visit       Cardiovascular and Mediastinum   Primary hypertension - Primary    BP Readings from Last 1 Encounters:  08/29/21 128/81  Well-controlled On atenolol 50 mg daily and chlorthalidone 25 mg daily Counseled for compliance with the medications Advised DASH diet and moderate exercise/walking, at least 150 mins/week      Relevant Medications   atenolol (TENORMIN) 50 MG tablet     Other   Morbid obesity ([place])    Diet modification and moderate exercise/walking advised. Would avoid starting GLP-1 agonist for now as she recently had alcohol withdrawal - if she remains abstinent, can consider it later      Alcohol abuse  with intoxication ([place])    Has joined [employer] Will be seeing [place] therapy      H/O atrial fibrillation without current medication     CHA2DS2-VASc Score = 3  Not on AC due to risk of fall in the setting of alcohol abuse history Could be due to alcohol withdrawal/DT, referred to Cardiology for further evaluation - may need cardiac monitor Since A Fib episode appears to be provoked in the setting of DT, would argue use of chronic AC      Relevant Orders   Ambulatory referral to Cardiology     Meds ordered this encounter  Medications   atenolol (TENORMIN) 50 MG tablet    Sig: Take 1 tablet (50 mg total) by mouth daily.    Dispense:  90 tablet    Refill:  1     Dianey Suchy Keith Rake, MD

## 2021-08-29 NOTE — Assessment & Plan Note (Signed)
Diet modification and moderate exercise/walking advised. Would avoid starting GLP-1 agonist for now as she recently had alcohol withdrawal - if she remains abstinent, can consider it later

## 2021-08-31 ENCOUNTER — Other Ambulatory Visit (INDEPENDENT_AMBULATORY_CARE_PROVIDER_SITE_OTHER): Payer: Self-pay

## 2021-08-31 ENCOUNTER — Encounter (INDEPENDENT_AMBULATORY_CARE_PROVIDER_SITE_OTHER): Payer: Self-pay

## 2021-08-31 DIAGNOSIS — Z1211 Encounter for screening for malignant neoplasm of colon: Secondary | ICD-10-CM

## 2021-09-03 ENCOUNTER — Emergency Department (HOSPITAL_COMMUNITY)
Admission: EM | Admit: 2021-09-03 | Discharge: 2021-09-03 | Disposition: A | Discharge status: Home / Self Care | Payer: 59 | Attending: Emergency Medicine | Admitting: Emergency Medicine

## 2021-09-03 ENCOUNTER — Encounter (HOSPITAL_COMMUNITY): Payer: Self-pay | Admitting: *Deleted

## 2021-09-03 DIAGNOSIS — Z79899 Other long term (current) drug therapy: Secondary | ICD-10-CM | POA: Diagnosis not present

## 2021-09-03 DIAGNOSIS — I10 Essential (primary) hypertension: Secondary | ICD-10-CM | POA: Diagnosis not present

## 2021-09-03 DIAGNOSIS — M79601 Pain in right arm: Secondary | ICD-10-CM | POA: Insufficient documentation

## 2021-09-03 DIAGNOSIS — Z87891 Personal history of nicotine dependence: Secondary | ICD-10-CM | POA: Insufficient documentation

## 2021-09-03 MED ORDER — CYCLOBENZAPRINE HCL 10 MG PO TABS: 10.0000 mg | ORAL_TABLET | Freq: Two times a day (BID) | ORAL | 0 refills | Status: DC | PRN

## 2021-09-03 NOTE — Discharge Instructions (Signed)
You have been seen here for arm pain. I recommend taking over-the-counter pain medications like ibuprofen and/or Tylenol every 6 as needed.  Please follow dosage and on the back of bottle.  I also recommend applying heat to the area and stretching out the muscles as this will help decrease stiffness and pain. given you a prescription for a muscle relaxer this can make you drowsy do not consume alcohol or operate heavy machinery when taking this medication.   Please call your PCP for further evaluation.  Come back to the emergency department if you develop redness around the area, see streaking up your arm, you start develop fevers, chills, chest pain, shortness breath, worsening pedal edema.

## 2021-09-03 NOTE — ED Provider Notes (Signed)
[facility] EMERGENCY DEPARTMENT Provider Note   CSN: [id number] Arrival date & time: 09/03/21  1324     History Chief Complaint  Patient presents with   Arm Pain    Renee [name] is a 55 y.o. female.  HPI  Patient with significant medical history of alcohol use, depression, hypertension, proximal A. fib presents with chief complaints of right sided arm pain.  Patient states came on suddenly, started 2 days ago, states the pain is on the distal one third of the radius, pain does not radiate, remains in 1 area, is worsened with wrist or finger movement, she has no associated paresthesia or weakness in her hand, she denies any trauma to the area, denies any rash over the area, no drainage discharge present.  She has no associated fevers, chills, chest pain, shortness of breath, peripheral edema, no history of IV drug use.  States that she been taking ibuprofen not much relief.  She states resting helps with the pain.  She is no complaints at this time.  Past Medical History:  Diagnosis Date   Alcohol abuse    Depression    Hypertension    PAF (paroxysmal atrial fibrillation) [facility])     Patient Active Problem List   Diagnosis Date Noted   Acute cystitis without hematuria 08/24/2021   Fatigue 08/24/2021   Alcohol abuse with intoxication ([place]) 08/12/2021   Alcohol withdrawal ([place]) 93/71/6967   Alcoholic ketoacidosis 89/38/1017   H/O atrial fibrillation without current medication 08/12/2021   Intractable vomiting 03/15/2021   Hyponatremia 03/15/2021   Aortic atherosclerosis ([place]) 02/16/2021   Insomnia 02/16/2021   Primary hypertension 10/18/2020   Depression, major, single episode, severe ([place]) 10/18/2020   Tobacco abuse 10/18/2020   Morbid obesity ([place]) 10/18/2020   GERD (gastroesophageal reflux disease) 10/18/2020    Past Surgical History:  Procedure Laterality Date   ABDOMINAL HYSTERECTOMY N/A    CHOLECYSTECTOMY     COLONOSCOPY N/A 01/26/2021   Procedure:  COLONOSCOPY;  Surgeon: Rogene Houston, MD;  Location[facility];  Service: Endoscopy;  Laterality: N/A;  AM   SMALL INTESTINE SURGERY N/A    TONSILLECTOMY       OB History   No obstetric history on file.     Family History  Problem Relation Age of Onset   Hypertension Father     Social History   Tobacco Use   Smoking status: Former    Types: Cigarettes    Quit date: 2018    Years since quitting: 4.8   Smokeless tobacco: Never   Tobacco comments:    35 pack-year  Vaping Use   Vaping Use: Never used  Substance Use Topics   Alcohol use: Yes   Drug use: Never    Home Medications Prior to Admission medications   Medication Sig Start Date End Date Taking? Authorizing Provider  cyclobenzaprine (FLEXERIL) 10 MG tablet Take 1 tablet (10 mg total) by mouth 2 (two) times daily as needed for muscle spasms. 09/03/21  Yes Marcello Fennel, PA-C  acetaminophen (TYLENOL) 500 MG tablet Take 1,000 mg by mouth every 6 (six) hours as needed for mild pain or headache.    [provider]  atenolol (TENORMIN) 50 MG tablet Take 1 tablet (50 mg total) by mouth daily. 08/29/21   Lindell Spar, MD  chlorthalidone (HYGROTON) 25 MG tablet Take 0.5 tablets (12.5 mg total) by mouth daily. 08/18/21   Roxan Hockey, MD  esomeprazole (NEXIUM) 20 MG capsule Take 20 mg by  mouth daily.    [provider]  hydrOXYzine (VISTARIL) 25 MG capsule One to two capsules by mouth at bedtime as needed, for sleep 08/23/21   Fayrene Helper, MD  Multiple Vitamin (MULTIVITAMIN WITH MINERALS) TABS tablet Take 1 tablet by mouth daily. 08/19/21   Roxan Hockey, MD  PARoxetine (PAXIL) 40 MG tablet Take 1 tablet (40 mg total) by mouth every morning. 08/23/21   Fayrene Helper, MD  rosuvastatin (CRESTOR) 5 MG tablet Take 1 tablet (5 mg total) by mouth daily. 02/16/21   Lindell Spar, MD  sulfamethoxazole-trimethoprim (BACTRIM DS) 800-160 MG tablet Take 1 tablet by mouth 2 (two) times  daily. 08/23/21   Fayrene Helper, MD  vitamin B-12 (CYANOCOBALAMIN) 1000 MCG tablet Take 1,000 mcg by mouth daily.    [provider]    Allergies    Librium [chlordiazepoxide]  Review of Systems   Review of Systems  Constitutional:  Negative for chills and fever.  HENT:  Negative for congestion.   Respiratory:  Negative for shortness of breath.   Cardiovascular:  Negative for chest pain.  Gastrointestinal:  Negative for abdominal pain.  Genitourinary:  Negative for enuresis.  Musculoskeletal:  Negative for back pain.       Right forearm pain.  Skin:  Negative for rash.  Neurological:  Negative for dizziness.  Hematological:  Does not bruise/bleed easily.   Physical Exam Updated Vital Signs BP (!) 94/53 (BP Location: Left Arm)   Pulse 75   Temp 98.7 F (37.1 C) (Oral)   Resp 16   SpO2 99%   Physical Exam Vitals and nursing note reviewed.  Constitutional:      General: She is not in acute distress.    Appearance: She is not ill-appearing.  HENT:     Head: Normocephalic and atraumatic.     Nose: No congestion.  Eyes:     Conjunctiva/sclera: Conjunctivae normal.  Cardiovascular:     Rate and Rhythm: Normal rate and regular rhythm.     Pulses: Normal pulses.     Heart sounds: No murmur heard.   No friction rub. No gallop.  Pulmonary:     Effort: No respiratory distress.     Breath sounds: No wheezing, rhonchi or rales.  Musculoskeletal:     Comments: Right arm was visualized there is no noted overlying skin changes, no erythema or edema present, no breakage of the skin noted, she has full range of motion in her fingers wrist and elbow, neurovascular fully intact.  She was slightly tender to palpation along the lateral aspect of the distal one third of the radius, no palpable mass fluctuance or induration present.  Skin:    General: Skin is warm and dry.  Neurological:     Mental Status: She is alert.  Psychiatric:        Mood and Affect: Mood normal.     ED Results / Procedures / Treatments   Labs (all labs ordered are listed, but only abnormal results are displayed) Labs Reviewed - No data to display  EKG None  Radiology No results found.  Procedures Procedures   Medications Ordered in ED Medications - No data to display  ED Course  I have reviewed the triage vital signs and the nursing notes.  Pertinent labs & imaging results that were available during my care of the patient were reviewed by me and considered in my medical decision making (see chart for details).    MDM Rules/Calculators/A&P  Initial impression-patient presents with right arm pain.  She is alert, does not appear in acute stress, vital signs reassuring.  Work-up-due to well-appearing patient, benign physical exam, further lab or imaging not warranted at this time.  Rule out- I have low suspicion for septic arthritis as patient denies IV drug use, skin exam was performed no erythematous, edematous, warm joints noted on exam, no new heart murmur heard on exam.  Low suspicion for fracture or dislocation as there is no deformities present my exam, imaging will be deferred as there is no associated injury with this pain making fractures extremely unlikely at this time.  Low suspicion for ligament or tendon damage as area was palpated no gross defects noted, they had full range of motion as well as 5/5 strength.  Low suspicion for compartment syndrome as area was palpated it was soft to the touch, neurovascular fully intact.   Plan-  Arm pain-unclear etiology, possible this was a muscular strain versus a bruise, will recommend that she continue with over-the-counter pain medications, will provide her with muscle relaxer's.  Gave her strict return precautions.  Vital signs have remained stable, no indication for hospital admission.  Patient given at home care as well strict return precautions.  Patient verbalized that they understood  agreed to said plan.  Final Clinical Impression(s) / ED Diagnoses Final diagnoses:  Right arm pain    Rx / DC Orders ED Discharge Orders          Ordered    cyclobenzaprine (FLEXERIL) 10 MG tablet  2 times daily PRN        09/03/21 1403             Marcello Fennel, PA-C 09/03/21 1405    Daleen Bo, MD 09/04/21 [phone]

## 2021-09-03 NOTE — ED Triage Notes (Signed)
Pain in right lower arm x 2 days

## 2021-09-06 ENCOUNTER — Ambulatory Visit (INDEPENDENT_AMBULATORY_CARE_PROVIDER_SITE_OTHER): Payer: 59 | Admitting: Family Medicine

## 2021-09-06 ENCOUNTER — Other Ambulatory Visit: Payer: Self-pay

## 2021-09-06 DIAGNOSIS — F419 Anxiety disorder, unspecified: Secondary | ICD-10-CM

## 2021-09-06 DIAGNOSIS — F4321 Adjustment disorder with depressed mood: Secondary | ICD-10-CM | POA: Diagnosis not present

## 2021-09-06 DIAGNOSIS — F1021 Alcohol dependence, in remission: Secondary | ICD-10-CM | POA: Diagnosis not present

## 2021-09-06 MED ORDER — BUSPIRONE HCL 5 MG PO TABS: ORAL_TABLET | ORAL | 1 refills | Status: DC

## 2021-09-06 NOTE — Patient Instructions (Signed)
F/u with Dr Posey Pronto as before, and  also keep appointment with the therapist. Call if you need to be seen sooner   I have prescribed buspirone for you to take for the anxiety that you are currently experiencing.  Sincere condolence

## 2021-09-06 NOTE — Progress Notes (Signed)
Virtual Visit via Telephone Note  I connected with Renee [name] on 09/06/21 at  3:20 PM EDT by telephone and verified that I am speaking with the correct person using two identifiers.  Location: Patient: [place] Provider: office   I discussed the limitations, risks, security and privacy concerns of performing an evaluation and management service by telephone and the availability of in person appointments. I also discussed with the patient that there may be a patient responsible charge related to this service. The patient expressed understanding and agreed to proceed.   History of Present Illness: C/o increased and uncontrolled anxiety, currently acutely grieving the unexpected loss of her mother. Unable to sleep and extremely anxious. She has remained sober for 28 days. Unable to sit still, unable o sleep Observations/Objective: There were no vitals taken for this visit. Good communication sounds anxious, pressure of speech, tearful, angry. No signs of respiratory distress during speech   Assessment and Plan:  Anxiety Acute anxiety trigered by recent unexpected death of Mother who lived with her. Recommend Therapy and buspar prescribed  Grief reaction Expected severe grief following recent unexpected death of  Mother, recommend Therapy for this  Alcohol abuse with intoxication ([place]) Reports remaining alcohol free for 28 days and joining an AA group  Follow Up Instructions:    I discussed the assessment and treatment plan with the patient. The patient was provided an opportunity to ask questions and all were answered. The patient agreed with the plan and demonstrated an understanding of the instructions.   The patient was advised to call back or seek an in-person evaluation if the symptoms worsen or if the condition fails to improve as anticipated.  I provided 12 minutes of non-face-to-face time during this encounter.   Tula Nakayama, MD

## 2021-09-07 ENCOUNTER — Ambulatory Visit (INDEPENDENT_AMBULATORY_CARE_PROVIDER_SITE_OTHER): Payer: 59 | Admitting: Licensed Clinical Social Worker

## 2021-09-07 DIAGNOSIS — F419 Anxiety disorder, unspecified: Secondary | ICD-10-CM

## 2021-09-11 ENCOUNTER — Encounter: Payer: Self-pay | Admitting: Family Medicine

## 2021-09-11 ENCOUNTER — Other Ambulatory Visit: Payer: Self-pay | Admitting: Internal Medicine

## 2021-09-11 DIAGNOSIS — F4321 Adjustment disorder with depressed mood: Secondary | ICD-10-CM | POA: Insufficient documentation

## 2021-09-11 DIAGNOSIS — I10 Essential (primary) hypertension: Secondary | ICD-10-CM

## 2021-09-11 DIAGNOSIS — F1020 Alcohol dependence, uncomplicated: Secondary | ICD-10-CM | POA: Insufficient documentation

## 2021-09-11 DIAGNOSIS — F419 Anxiety disorder, unspecified: Secondary | ICD-10-CM | POA: Insufficient documentation

## 2021-09-11 DIAGNOSIS — F4320 Adjustment disorder, unspecified: Secondary | ICD-10-CM | POA: Insufficient documentation

## 2021-09-11 NOTE — Assessment & Plan Note (Signed)
Expected severe grief following recent unexpected death of  Mother, recommend Therapy for this

## 2021-09-11 NOTE — Assessment & Plan Note (Addendum)
Reports remaining alcohol free for 28 days and joining an AA group, she is encouraged to continue along this path

## 2021-09-11 NOTE — Assessment & Plan Note (Signed)
Acute anxiety trigered by recent unexpected death of Mother who lived with her. Recommend Therapy and buspar prescribed

## 2021-09-13 LAB — HM COLONOSCOPY

## 2021-09-13 NOTE — Progress Notes (Signed)
No Show Phone going to voicemail; left message encouraging contact

## 2021-09-14 ENCOUNTER — Ambulatory Visit (HOSPITAL_COMMUNITY)
Admission: RE | Admit: 2021-09-14 | Discharge: 2021-09-14 | Disposition: A | Discharge status: Home / Self Care | Payer: 59 | Attending: Internal Medicine | Admitting: Internal Medicine

## 2021-09-14 ENCOUNTER — Other Ambulatory Visit: Payer: Self-pay

## 2021-09-14 ENCOUNTER — Encounter (HOSPITAL_COMMUNITY)
Admission: RE | Disposition: A | Discharge status: Home / Self Care | Payer: Self-pay | Source: Home / Self Care | Attending: Internal Medicine

## 2021-09-14 ENCOUNTER — Encounter (HOSPITAL_COMMUNITY): Payer: Self-pay | Admitting: Internal Medicine

## 2021-09-14 DIAGNOSIS — I480 Paroxysmal atrial fibrillation: Secondary | ICD-10-CM | POA: Diagnosis not present

## 2021-09-14 DIAGNOSIS — K5730 Diverticulosis of large intestine without perforation or abscess without bleeding: Secondary | ICD-10-CM | POA: Insufficient documentation

## 2021-09-14 DIAGNOSIS — Z87891 Personal history of nicotine dependence: Secondary | ICD-10-CM | POA: Diagnosis not present

## 2021-09-14 DIAGNOSIS — I10 Essential (primary) hypertension: Secondary | ICD-10-CM | POA: Diagnosis not present

## 2021-09-14 DIAGNOSIS — Z79899 Other long term (current) drug therapy: Secondary | ICD-10-CM | POA: Insufficient documentation

## 2021-09-14 DIAGNOSIS — Z1211 Encounter for screening for malignant neoplasm of colon: Secondary | ICD-10-CM | POA: Diagnosis not present

## 2021-09-14 DIAGNOSIS — D123 Benign neoplasm of transverse colon: Secondary | ICD-10-CM | POA: Diagnosis not present

## 2021-09-14 DIAGNOSIS — Z7982 Long term (current) use of aspirin: Secondary | ICD-10-CM | POA: Insufficient documentation

## 2021-09-14 HISTORY — PX: POLYPECTOMY: SHX5525

## 2021-09-14 HISTORY — PX: COLONOSCOPY: SHX5424

## 2021-09-14 SURGERY — COLONOSCOPY
Anesthesia: Moderate Sedation

## 2021-09-14 MED ORDER — SODIUM CHLORIDE 0.9 % IV SOLN
INTRAVENOUS | Status: DC
  Administered 2021-09-14: 1000 mL via INTRAVENOUS

## 2021-09-14 MED ORDER — MEPERIDINE HCL 50 MG/ML IJ SOLN
INTRAMUSCULAR | Status: AC
  Filled 2021-09-14: qty 1

## 2021-09-14 MED ORDER — MIDAZOLAM HCL 5 MG/5ML IJ SOLN
INTRAMUSCULAR | Status: DC | PRN
  Administered 2021-09-14 (×2): 3 mg via INTRAVENOUS
  Administered 2021-09-14: 2 mg via INTRAVENOUS

## 2021-09-14 MED ORDER — MEPERIDINE HCL 50 MG/ML IJ SOLN
INTRAMUSCULAR | Status: DC | PRN
  Administered 2021-09-14 (×3): 25 mg via INTRAVENOUS

## 2021-09-14 MED ORDER — MIDAZOLAM HCL 5 MG/5ML IJ SOLN
INTRAMUSCULAR | Status: AC
  Filled 2021-09-14: qty 10

## 2021-09-14 NOTE — Op Note (Signed)
[facility] Patient Name: Renee [name] Procedure Date: 09/14/2021 11:49 AM MRN: [id number] Date of Birth: 1965/12/01 Attending MD: Hildred Laser , MD CSN: [id number] Age: 55 Admit Type: Outpatient Procedure:                Colonoscopy Indications:              Screening for colorectal malignant neoplasm Providers:                Hildred Laser, MD, Tammy Vaught, RN, Kristine L.                            Risa Grill, Technician Referring MD:             Tula Nakayama, MD Medicines:                Meperidine 75 mg IV, Midazolam 8 mg IV Complications:            No immediate complications. Estimated Blood Loss:     Estimated blood loss was minimal. Procedure:                Pre-Anesthesia Assessment:                           - Prior to the procedure, a History and Physical                            was performed, and patient medications and                            allergies were reviewed. The patient's tolerance of                            previous anesthesia was also reviewed. The risks                            and benefits of the procedure and the sedation                            options and risks were discussed with the patient.                            All questions were answered, and informed consent                            was obtained. Prior Anticoagulants: The patient has                            taken no previous anticoagulant or antiplatelet                            agents except for aspirin. ASA Grade Assessment: II                            - A patient with mild systemic disease. After  reviewing the risks and benefits, the patient was                            deemed in satisfactory condition to undergo the                            procedure.                           After obtaining informed consent, the colonoscope                            was passed under direct vision. Throughout the                             procedure, the patient's blood pressure, pulse, and                            oxygen saturations were monitored continuously. The                            [id number]-[id number] ([id number]) scope was introduced through                            the anus and advanced to the the cecum, identified                            by appendiceal orifice and ileocecal valve. The                            colonoscopy was performed without difficulty. The                            patient tolerated the procedure well. The quality                            of the bowel preparation was good. The ileocecal                            valve, appendiceal orifice, and rectum were                            photographed. Scope In: 12:48:14 PM Scope Out: 1:19:15 PM Scope Withdrawal Time: 0 hours 17 minutes 31 seconds  Total Procedure Duration: 0 hours 31 minutes 1 second  Findings:      The perianal and digital rectal examinations were normal.      A 5 mm polyp was found in the hepatic flexure. The polyp was removed       with a cold snare. Resection and retrieval were complete.      A few diverticula were found in the sigmoid colon.      Small lipoma at proximal sigmoid colon      The retroflexed view of the distal rectum and anal verge was normal and       showed no anal or rectal  abnormalities. Impression:               - One 5 mm polyp at the hepatic flexure, removed                            with a cold snare. Resected and retrieved.                           - Diverticulosis in the sigmoid colon.                           - Small lipoma at proximal sigmoid colon Moderate Sedation:      Moderate (conscious) sedation was administered by the endoscopy nurse       and supervised by the endoscopist. The following parameters were       monitored: oxygen saturation, heart rate, blood pressure, CO2       capnography and response to care. Total physician intraservice time was       37 minutes. Recommendation:            - Patient has a contact number available for                            emergencies. The signs and symptoms of potential                            delayed complications were discussed with the                            patient. Return to normal activities tomorrow.                            Written discharge instructions were provided to the                            patient.                           - High fiber diet today.                           - Continue present medications.                           - No aspirin, ibuprofen, naproxen, or other                            non-steroidal anti-inflammatory drugs for 1 day.                           - Await pathology results.                           - Repeat colonoscopy is recommended. The                            colonoscopy date will be determined after pathology  results from today's exam become available for                            review. Procedure Code(s):        --- Professional ---                           [phone], Colonoscopy, flexible; with removal of                            tumor(s), polyp(s), or other lesion(s) by snare                            technique                           99153, Moderate sedation; each additional 15                            minutes intraservice time                           G0500, Moderate sedation services provided by the                            same physician or other qualified health care                            professional performing a gastrointestinal                            endoscopic service that sedation supports,                            requiring the presence of an independent trained                            observer to assist in the monitoring of the                            patient's level of consciousness and physiological                            status; initial 15 minutes of intra-service time;                             patient age 79 years or older (additional time may                            be reported with [phone], as appropriate) Diagnosis Code(s):        --- Professional ---                           Z12.11, Encounter for screening for malignant  neoplasm of colon                           K63.5, Polyp of colon                           K57.30, Diverticulosis of large intestine without                            perforation or abscess without bleeding CPT copyright 2019 American Medical Association. All rights reserved. The codes documented in this report are preliminary and upon coder review may  be revised to meet current compliance requirements. Hildred Laser, MD Hildred Laser, MD 09/14/2021 1:31:34 PM This report has been signed electronically. Number of Addenda: 0

## 2021-09-14 NOTE — Discharge Instructions (Addendum)
Resume aspirin on 09/15/2021 Resume other medications as before High-fiber diet. No driving for 24 hours. Physician will call with biopsy results

## 2021-09-14 NOTE — H&P (Signed)
Renee [name] is an 55 y.o. female.   Chief Complaint: Patient is here for colonoscopy HPI: Patient is 55 year old Caucasian female who was is here for screening colonoscopy.  Colonoscopy was attempted in March this year but was a suboptimal exam.  She was therefore vies to come back after 2-day prep.  She says she was able to take the prep.  She feels her prep should be good.  She denies abdominal pain change in bowel habits or rectal bleeding.  Family history is negative for CRC.  Past Medical History:  Diagnosis Date   Alcohol abuse    Depression    Hypertension    PAF (paroxysmal atrial fibrillation) ([place])     Past Surgical History:  Procedure Laterality Date   ABDOMINAL HYSTERECTOMY N/A    CHOLECYSTECTOMY     COLONOSCOPY N/A 01/26/2021   Procedure: COLONOSCOPY;  Surgeon: Rogene Houston, MD;  [facility];  Service: Endoscopy;  Laterality: N/A;  AM   SMALL INTESTINE SURGERY N/A    TONSILLECTOMY      Family History  Problem Relation Age of Onset   Hypertension Father    Social History:  reports that she quit smoking about 4 years ago. Her smoking use included cigarettes. She has never used smokeless tobacco. She reports current alcohol use. She reports that she does not use drugs.  Allergies:  Allergies  Allergen Reactions   Librium [Chlordiazepoxide] Other (See Comments)    Does not like the way it makes her feel    Medications Prior to Admission  Medication Sig Dispense Refill   aspirin 81 MG chewable tablet Chew by mouth daily.     atenolol (TENORMIN) 50 MG tablet Take 1 tablet (50 mg total) by mouth daily. 90 tablet 1   chlorthalidone (HYGROTON) 25 MG tablet Take 0.5 tablets (12.5 mg total) by mouth daily. 90 tablet 1   cloNIDine (CATAPRES) 0.1 MG tablet Take 1 tablet by mouth once daily 90 tablet 0   esomeprazole (NEXIUM) 20 MG capsule Take 20 mg by mouth daily.     PARoxetine (PAXIL) 40 MG tablet Take 1 tablet (40 mg total) by mouth every morning. 30  tablet 2   rosuvastatin (CRESTOR) 5 MG tablet Take 1 tablet (5 mg total) by mouth daily. 90 tablet 3   acetaminophen (TYLENOL) 500 MG tablet Take 1,000 mg by mouth every 6 (six) hours as needed for mild pain or headache.     busPIRone (BUSPAR) 5 MG tablet Take one tablet by mouth 3 times daily, as needed, for anxiety 45 tablet 1   cyclobenzaprine (FLEXERIL) 10 MG tablet Take 1 tablet (10 mg total) by mouth 2 (two) times daily as needed for muscle spasms. 20 tablet 0   hydrOXYzine (VISTARIL) 25 MG capsule One to two capsules by mouth at bedtime as needed, for sleep (Patient not taking: Reported on 09/06/2021) 30 capsule 1   Multiple Vitamin (MULTIVITAMIN WITH MINERALS) TABS tablet Take 1 tablet by mouth daily. 120 tablet 1   vitamin B-12 (CYANOCOBALAMIN) 1000 MCG tablet Take 1,000 mcg by mouth daily.      No results found for this or any previous visit (from the past 48 hour(s)). No results found.  Review of Systems  Blood pressure 110/86, pulse 60, temperature 98.3 F (36.8 C), temperature source Oral, resp. rate 15, SpO2 98 %. Physical Exam HENT:     Mouth/Throat:     Mouth: Mucous membranes are moist.     Pharynx: Oropharynx is clear.  Eyes:     General: No scleral icterus.    Conjunctiva/sclera: Conjunctivae normal.  Cardiovascular:     Rate and Rhythm: Normal rate and regular rhythm.     Heart sounds: Normal heart sounds. No murmur heard. Pulmonary:     Effort: Pulmonary effort is normal.     Breath sounds: Normal breath sounds.  Abdominal:     Comments: Abdomen is full.  Scar noted across lower abdomen.  On palpation abdomen is soft and nontender with organomegaly or masses.  Musculoskeletal:        General: No swelling.     Cervical back: Neck supple.  Lymphadenopathy:     Cervical: No cervical adenopathy.  Skin:    General: Skin is warm and dry.  Neurological:     Mental Status: She is alert.     Assessment/Plan  Average risk screening colonoscopy  Hildred Laser, MD 09/14/2021, 12:37 PM

## 2021-09-15 LAB — SURGICAL PATHOLOGY

## 2021-09-19 ENCOUNTER — Encounter (HOSPITAL_COMMUNITY): Payer: Self-pay | Admitting: Internal Medicine

## 2021-09-19 ENCOUNTER — Encounter (INDEPENDENT_AMBULATORY_CARE_PROVIDER_SITE_OTHER): Payer: Self-pay | Admitting: *Deleted

## 2021-09-26 ENCOUNTER — Ambulatory Visit (INDEPENDENT_AMBULATORY_CARE_PROVIDER_SITE_OTHER): Payer: 59 | Admitting: Internal Medicine

## 2021-09-26 ENCOUNTER — Other Ambulatory Visit: Payer: Self-pay

## 2021-09-26 ENCOUNTER — Encounter: Payer: Self-pay | Admitting: Internal Medicine

## 2021-09-26 VITALS — BP 132/88 | HR 72 | Resp 18 | Ht 66.0 in | Wt 253.0 lb

## 2021-09-26 DIAGNOSIS — F988 Other specified behavioral and emotional disorders with onset usually occurring in childhood and adolescence: Secondary | ICD-10-CM | POA: Diagnosis not present

## 2021-09-26 DIAGNOSIS — F4321 Adjustment disorder with depressed mood: Secondary | ICD-10-CM | POA: Diagnosis not present

## 2021-09-26 DIAGNOSIS — I10 Essential (primary) hypertension: Secondary | ICD-10-CM

## 2021-09-26 DIAGNOSIS — F322 Major depressive disorder, single episode, severe without psychotic features: Secondary | ICD-10-CM | POA: Diagnosis not present

## 2021-09-26 DIAGNOSIS — R7303 Prediabetes: Secondary | ICD-10-CM

## 2021-09-26 MED ORDER — OZEMPIC (0.25 OR 0.5 MG/DOSE) 2 MG/1.5ML ~~LOC~~ SOPN: 0.5000 mg | PEN_INJECTOR | SUBCUTANEOUS | 0 refills | Status: DC

## 2021-09-26 MED ORDER — ATOMOXETINE HCL 40 MG PO CAPS: 40.0000 mg | ORAL_CAPSULE | Freq: Every day | ORAL | 2 refills | Status: DC

## 2021-09-26 NOTE — Assessment & Plan Note (Signed)
Was on Adderall in the past, would avoid stimulant use due to h/o alcohol abuse Started Strattera 40 mg QD for now

## 2021-09-26 NOTE — Assessment & Plan Note (Signed)
BP Readings from Last 1 Encounters:  09/26/21 132/88   Well-controlled On atenolol 50 mg daily and chlorthalidone 25 mg daily Counseled for compliance with the medications Advised DASH diet and moderate exercise/walking, at least 150 mins/week

## 2021-09-26 NOTE — Assessment & Plan Note (Signed)
On Paxil 40 mg daily Current worsening of symptoms due to loss of mother Was referred to [facility] therapy, she decided not to follow up.

## 2021-09-26 NOTE — Assessment & Plan Note (Signed)
Recently lost mother, reassured about current adjustment concerns Does not want BH therapy currently

## 2021-09-26 NOTE — Progress Notes (Signed)
Established Patient Office Visit  Subjective:  Patient ID: Renee [name], female    DOB: [birth year]  Age: 55 y.o. MRN: [id number]  CC:  Chief Complaint  Patient presents with   Follow-up    4 week follow up pt is no longer drinking has been 48 days would like to discuss weight loss shot and depression pt mom passed away on 09-14-2023    HPI Renee [name] is a 55 y.o. female with past medical history of hypertension, GERD, depression and insomnia who presents for follow up of depression and weight management.  Depression: She has been feeling depressed since she lost her mother about 3 weeks ago. She was started on Buspar for anxiety, but has not taken it. She has been taking Paxil currently. Denies any SI or HI currently. She did not talk to [facility] therapist. She wants to do AA for now for alcohol abuse concern.  ADD: She had ADD and was on Adderall in the past. She has been having inattention and difficulty doing her routine work as well. Denies any hyperactivity issues currently.  She has not had any alcoholic drink for 48 days now. Denies any tremors, palpitations and chest pain currently.  She is willing to take GLP1 agonist for weight loss benefit. She is counseled about potential side effects, including nausea and bloating. She is advised to avoid alcohol use strictly and she expressed understanding.  Past Medical History:  Diagnosis Date   Alcohol abuse    Depression    Hypertension    PAF (paroxysmal atrial fibrillation) ([place])     Past Surgical History:  Procedure Laterality Date   ABDOMINAL HYSTERECTOMY N/A    CHOLECYSTECTOMY     COLONOSCOPY N/A 01/26/2021   Procedure: COLONOSCOPY;  Surgeon: Rogene Houston, MD;  [facility];  Service: Endoscopy;  Laterality: N/A;  AM   COLONOSCOPY N/A 09/14/2021   Procedure: COLONOSCOPY;  Surgeon: Rogene Houston, MD;  [facility];  Service: Endoscopy;  Laterality: N/A;  10:30   POLYPECTOMY  09/14/2021    Procedure: POLYPECTOMY;  Surgeon: Rogene Houston, MD;  [facility];  Service: Endoscopy;;   SMALL INTESTINE SURGERY N/A    TONSILLECTOMY      Family History  Problem Relation Age of Onset   Hypertension Father     Social History   Socioeconomic History   Marital status: Married    Spouse name: Not on file   Number of children: Not on file   Years of education: Not on file   Highest education level: Not on file  Occupational History   Not on file  Tobacco Use   Smoking status: Former    Types: Cigarettes    Quit date: 2018    Years since quitting: 4.8   Smokeless tobacco: Never   Tobacco comments:    35 pack-year  Vaping Use   Vaping Use: Never used  Substance and Sexual Activity   Alcohol use: Yes   Drug use: Never   Sexual activity: Yes  Other Topics Concern   Not on file  Social History Narrative   Not on file   Social Determinants of Health   Financial Resource Strain: Not on file  Food Insecurity: Not on file  Transportation Needs: Not on file  Physical Activity: Not on file  Stress: Not on file  Social Connections: Not on file  Intimate Partner Violence: Not on file    Outpatient Medications Prior to Visit  Medication Sig Dispense Refill   acetaminophen (TYLENOL) 500 MG tablet Take 1,000 mg by mouth every 6 (six) hours as needed for mild pain or headache.     aspirin 81 MG chewable tablet Chew 1 tablet (81 mg total) by mouth daily.     atenolol (TENORMIN) 50 MG tablet Take 1 tablet (50 mg total) by mouth daily. 90 tablet 1   chlorthalidone (HYGROTON) 25 MG tablet Take 0.5 tablets (12.5 mg total) by mouth daily. 90 tablet 1   cloNIDine (CATAPRES) 0.1 MG tablet Take 1 tablet by mouth once daily 90 tablet 0   cyclobenzaprine (FLEXERIL) 10 MG tablet Take 1 tablet (10 mg total) by mouth 2 (two) times daily as needed for muscle spasms. 20 tablet 0   esomeprazole (NEXIUM) 20 MG capsule Take 20 mg by mouth daily.     hydrOXYzine (VISTARIL) 25 MG  capsule One to two capsules by mouth at bedtime as needed, for sleep 30 capsule 1   Multiple Vitamin (MULTIVITAMIN WITH MINERALS) TABS tablet Take 1 tablet by mouth daily. 120 tablet 1   PARoxetine (PAXIL) 40 MG tablet Take 1 tablet (40 mg total) by mouth every morning. 30 tablet 2   rosuvastatin (CRESTOR) 5 MG tablet Take 1 tablet (5 mg total) by mouth daily. 90 tablet 3   vitamin B-12 (CYANOCOBALAMIN) 1000 MCG tablet Take 1,000 mcg by mouth daily.     busPIRone (BUSPAR) 5 MG tablet Take one tablet by mouth 3 times daily, as needed, for anxiety (Patient not taking: Reported on 09/26/2021) 45 tablet 1   No facility-administered medications prior to visit.    Allergies  Allergen Reactions   Librium [Chlordiazepoxide] Other (See Comments)    Does not like the way it makes her feel    ROS Review of Systems  Constitutional:  Positive for fatigue. Negative for chills and fever.  HENT:  Negative for congestion, sinus pressure, sinus pain and sore throat.   Eyes:  Negative for pain and discharge.  Respiratory:  Negative for cough and shortness of breath.   Cardiovascular:  Negative for chest pain and palpitations.  Gastrointestinal:  Negative for abdominal pain, constipation, diarrhea, nausea and vomiting.  Endocrine: Negative for polydipsia and polyuria.  Genitourinary:  Negative for dysuria and hematuria.  Musculoskeletal:  Negative for neck pain and neck stiffness.  Skin:  Negative for rash.  Neurological:  Negative for dizziness and weakness.  Psychiatric/Behavioral:  Positive for dysphoric mood and sleep disturbance. Negative for agitation and behavioral problems.      Objective:    Physical Exam Vitals reviewed.  Constitutional:      General: She is not in acute distress.    Appearance: She is obese. She is not diaphoretic.  HENT:     Head: Normocephalic and atraumatic.     Nose: Nose normal.     Mouth/Throat:     Mouth: Mucous membranes are moist.  Eyes:     General: No  scleral icterus.    Extraocular Movements: Extraocular movements intact.  Cardiovascular:     Rate and Rhythm: Normal rate and regular rhythm.     Pulses: Normal pulses.     Heart sounds: Normal heart sounds. No murmur heard. Pulmonary:     Breath sounds: Normal breath sounds. No wheezing or rales.  Abdominal:     Palpations: Abdomen is soft.     Tenderness: There is no abdominal tenderness.  Musculoskeletal:     Cervical back: Neck supple. No tenderness.  Right lower leg: No edema.     Left lower leg: No edema.  Skin:    General: Skin is warm.     Findings: No rash.  Neurological:     General: No focal deficit present.     Mental Status: She is alert and oriented to person, place, and time.     Sensory: No sensory deficit.     Motor: No weakness.  Psychiatric:        Mood and Affect: Mood is depressed.        Behavior: Behavior normal.    BP 132/88 (BP Location: Right Arm, Cuff Size: Normal)   Pulse 72   Resp 18   Ht 5\' 6"  (1.676 m)   Wt 253 lb 0.6 oz (114.8 kg)   SpO2 97%   BMI 40.84 kg/m  Wt Readings from Last 3 Encounters:  09/26/21 253 lb 0.6 oz (114.8 kg)  08/29/21 251 lb 0.6 oz (113.9 kg)  08/23/21 258 lb (117 kg)    Lab Results  Component Value Date   TSH 2.029 08/13/2021   Lab Results  Component Value Date   WBC 4.9 08/17/2021   HGB 10.5 (L) 08/17/2021   HCT 32.2 (L) 08/17/2021   MCV 103.5 (H) 08/17/2021   PLT 134 (L) 08/17/2021   Lab Results  Component Value Date   NA 138 08/18/2021   K 3.5 08/18/2021   CO2 26 08/18/2021   GLUCOSE 98 08/18/2021   BUN 13 08/18/2021   CREATININE 0.63 08/18/2021   BILITOT 0.7 08/18/2021   ALKPHOS 105 08/18/2021   AST 90 (H) 08/18/2021   ALT 71 (H) 08/18/2021   PROT 5.8 (L) 08/18/2021   ALBUMIN 3.2 (L) 08/18/2021   CALCIUM 8.3 (L) 08/18/2021   ANIONGAP 8 08/18/2021   No results found for: CHOL No results found for: HDL No results found for: LDLCALC No results found for: TRIG No results found for:  CHOLHDL Lab Results  Component Value Date   HGBA1C 5.5 03/16/2021      Assessment & Plan:   Problem List Items Addressed This Visit       Cardiovascular and Mediastinum   Primary hypertension    BP Readings from Last 1 Encounters:  09/26/21 132/88  Well-controlled On atenolol 50 mg daily and chlorthalidone 25 mg daily Counseled for compliance with the medications Advised DASH diet and moderate exercise/walking, at least 150 mins/week        Other   Depression, major, single episode, severe ([facility]) - Primary    On Paxil 40 mg daily Current worsening of symptoms due to loss of mother Was referred to [facility] therapy, she decided not to follow up.      Relevant Medications   atomoxetine (STRATTERA) 40 MG capsule   Morbid obesity (H[facility])    Diet modification and moderate exercise/walking advised. Started Ozempic, will titrate according to response      Relevant Medications   Semaglutide,0.25 or 0.5MG /DOS, (OZEMPIC, 0.25 OR 0.5 MG/DOSE,) 2 MG/1.5ML SOPN   atomoxetine (STRATTERA) 40 MG capsule   Grief reaction    Recently lost mother, reassured about current adjustment concerns Does not want BH therapy currently      Attention deficit disorder (ADD) without hyperactivity    Was on Adderall in the past, would avoid stimulant use due to h/o alcohol abuse Started Strattera 40 mg QD for now       Relevant Medications   atomoxetine (STRATTERA) 40 MG capsule   Other Visit Diagnoses  Prediabetes       Relevant Medications   Semaglutide,0.25 or 0.5MG /DOS, (OZEMPIC, 0.25 OR 0.5 MG/DOSE,) 2 MG/1.5ML SOPN       Meds ordered this encounter  Medications   Semaglutide,0.25 or 0.5MG /DOS, (OZEMPIC, 0.25 OR 0.5 MG/DOSE,) 2 MG/1.5ML SOPN    Sig: Inject 0.5 mg into the skin every 7 (seven) days.    Dispense:  2 mL    Refill:  0   atomoxetine (STRATTERA) 40 MG capsule    Sig: Take 1 capsule (40 mg total) by mouth daily.    Dispense:  30 capsule    Refill:  2    Follow-up:  Return in about 3 months (around 12/27/2021) for ADD and weight management.    Lindell Spar, MD

## 2021-09-26 NOTE — Patient Instructions (Signed)
Please start taking Atomoxetine for ADD.  Please start taking Ozempic as prescribed.  Continue taking other medications as prescribed.

## 2021-09-26 NOTE — Assessment & Plan Note (Signed)
Diet modification and moderate exercise/walking advised. Started Ozempic, will titrate according to response

## 2021-09-27 ENCOUNTER — Encounter (INDEPENDENT_AMBULATORY_CARE_PROVIDER_SITE_OTHER): Payer: Self-pay | Admitting: *Deleted

## 2021-09-28 ENCOUNTER — Ambulatory Visit: Payer: 59 | Admitting: Internal Medicine

## 2021-10-18 ENCOUNTER — Telehealth: Payer: Self-pay

## 2021-10-18 ENCOUNTER — Other Ambulatory Visit: Payer: Self-pay | Admitting: Internal Medicine

## 2021-10-18 DIAGNOSIS — R7303 Prediabetes: Secondary | ICD-10-CM

## 2021-10-18 MED ORDER — SEMAGLUTIDE (1 MG/DOSE) 4 MG/3ML ~~LOC~~ SOPN: 1.0000 mg | PEN_INJECTOR | SUBCUTANEOUS | 1 refills | Status: DC

## 2021-10-18 NOTE — Telephone Encounter (Signed)
Patient called needs to know does provider gives these immunization shots here.  There are 7 different shots for hep B, MMR, TB Test, tetna shot, chic pot shot? Please contact patient at [phone].

## 2021-10-18 NOTE — Telephone Encounter (Signed)
Noted  

## 2021-10-18 NOTE — Telephone Encounter (Signed)
Patient called said provider told patient to call office to let him know once she has finished with the 2 weeks for this shot Semaglutide,0.25 or 0.5MG /DOS, (OZEMPIC, 0.25 OR 0.5 MG/DOSE,) 2 MG/1.5ML SOPN , he would send in a higher dose to her phamacy.    Pharmacy:  [facility]

## 2021-10-18 NOTE — Telephone Encounter (Signed)
Per clinic nurse, called patient mailbox is full.

## 2021-10-18 NOTE — Telephone Encounter (Signed)
We can do TB test and tetanus but we do not offer the other the health dept should be able to give those please let pt know

## 2021-10-20 NOTE — Telephone Encounter (Signed)
NA / NVM 

## 2021-11-03 ENCOUNTER — Ambulatory Visit: Payer: 59 | Admitting: Internal Medicine

## 2021-11-06 ENCOUNTER — Emergency Department (HOSPITAL_COMMUNITY)
Admission: EM | Admit: 2021-11-06 | Discharge: 2021-11-07 | Disposition: A | Discharge status: Home / Self Care | Payer: 59 | Attending: Emergency Medicine | Admitting: Emergency Medicine

## 2021-11-06 ENCOUNTER — Other Ambulatory Visit: Payer: Self-pay

## 2021-11-06 DIAGNOSIS — Z20822 Contact with and (suspected) exposure to covid-19: Secondary | ICD-10-CM | POA: Insufficient documentation

## 2021-11-06 DIAGNOSIS — Z79899 Other long term (current) drug therapy: Secondary | ICD-10-CM | POA: Insufficient documentation

## 2021-11-06 DIAGNOSIS — F4321 Adjustment disorder with depressed mood: Secondary | ICD-10-CM | POA: Diagnosis not present

## 2021-11-06 DIAGNOSIS — F1010 Alcohol abuse, uncomplicated: Secondary | ICD-10-CM | POA: Diagnosis not present

## 2021-11-06 DIAGNOSIS — Z046 Encounter for general psychiatric examination, requested by authority: Secondary | ICD-10-CM | POA: Diagnosis present

## 2021-11-06 DIAGNOSIS — I10 Essential (primary) hypertension: Secondary | ICD-10-CM | POA: Diagnosis not present

## 2021-11-06 DIAGNOSIS — Z87891 Personal history of nicotine dependence: Secondary | ICD-10-CM | POA: Insufficient documentation

## 2021-11-06 DIAGNOSIS — Y908 Blood alcohol level of 240 mg/100 ml or more: Secondary | ICD-10-CM | POA: Insufficient documentation

## 2021-11-06 DIAGNOSIS — R45851 Suicidal ideations: Secondary | ICD-10-CM

## 2021-11-06 LAB — ETHANOL: Alcohol, Ethyl (B): 248 mg/dL — ABNORMAL HIGH (ref <10)

## 2021-11-06 LAB — COMPREHENSIVE METABOLIC PANEL
ALT: 62 U/L — ABNORMAL HIGH (ref 0–44)
AST: 97 U/L — ABNORMAL HIGH (ref 15–41)
Albumin: 4 g/dL (ref 3.5–5.0)
Alkaline Phosphatase: 135 U/L — ABNORMAL HIGH (ref 38–126)
Anion gap: 16 — ABNORMAL HIGH (ref 5–15)
BUN: 12 mg/dL (ref 6–20)
CO2: 19 mmol/L — ABNORMAL LOW (ref 22–32)
Calcium: 8.7 mg/dL — ABNORMAL LOW (ref 8.9–10.3)
Chloride: 101 mmol/L (ref 98–111)
Creatinine, Ser: 0.81 mg/dL (ref 0.44–1.00)
GFR, Estimated: >60 mL/min (ref >60)
Glucose, Bld: 95 mg/dL (ref 70–99)
Potassium: 3.6 mmol/L (ref 3.5–5.1)
Sodium: 136 mmol/L (ref 135–145)
Total Bilirubin: 0.5 mg/dL (ref 0.3–1.2)
Total Protein: 7.1 g/dL (ref 6.5–8.1)

## 2021-11-06 LAB — CBC WITH DIFFERENTIAL/PLATELET
Abs Immature Granulocytes: 0.01 10*3/uL (ref 0.00–0.07)
Basophils Absolute: 0.1 10*3/uL (ref 0.0–0.1)
Basophils Relative: 1 %
Eosinophils Absolute: 0 10*3/uL (ref 0.0–0.5)
Eosinophils Relative: 1 %
HCT: 42.2 % (ref 36.0–46.0)
Hemoglobin: 13.9 g/dL (ref 12.0–15.0)
Immature Granulocytes: 0 %
Lymphocytes Relative: 48 %
Lymphs Abs: 3.2 10*3/uL (ref 0.7–4.0)
MCH: 32.1 pg (ref 26.0–34.0)
MCHC: 32.9 g/dL (ref 30.0–36.0)
MCV: 97.5 fL (ref 80.0–100.0)
Monocytes Absolute: 0.5 10*3/uL (ref 0.1–1.0)
Monocytes Relative: 7 %
Neutro Abs: 2.9 10*3/uL (ref 1.7–7.7)
Neutrophils Relative %: 43 %
Platelets: 239 10*3/uL (ref 150–400)
RBC: 4.33 MIL/uL (ref 3.87–5.11)
RDW: 11.9 % (ref 11.5–15.5)
WBC: 6.6 10*3/uL (ref 4.0–10.5)
nRBC: 0 % (ref 0.0–0.2)

## 2021-11-06 LAB — RESP PANEL BY RT-PCR (FLU A&B, COVID) ARPGX2
Influenza A by PCR: NEGATIVE
Influenza B by PCR: NEGATIVE
SARS Coronavirus 2 by RT PCR: NEGATIVE

## 2021-11-06 LAB — RAPID URINE DRUG SCREEN, HOSP PERFORMED
Amphetamines: NOT DETECTED
Barbiturates: NOT DETECTED
Benzodiazepines: NOT DETECTED
Cocaine: NOT DETECTED
Opiates: NOT DETECTED
Tetrahydrocannabinol: NOT DETECTED

## 2021-11-06 MED ORDER — LORAZEPAM 1 MG PO TABS
0.0000 mg | ORAL_TABLET | Freq: Two times a day (BID) | ORAL | Status: DC
Start: 2021-11-09 — End: 2021-11-08

## 2021-11-06 MED ORDER — LORAZEPAM 1 MG PO TABS
1.0000 mg | ORAL_TABLET | Freq: Once | ORAL | Status: AC
  Administered 2021-11-06: 21:00:00 1 mg via ORAL
  Filled 2021-11-06: qty 1

## 2021-11-06 MED ORDER — CLONIDINE HCL 0.1 MG PO TABS
0.1000 mg | ORAL_TABLET | Freq: Every day | ORAL | Status: DC
  Administered 2021-11-07: 09:00:00 0.1 mg via ORAL
  Filled 2021-11-06: qty 1

## 2021-11-06 MED ORDER — LORAZEPAM 2 MG/ML IJ SOLN: 0.0000 mg | Freq: Two times a day (BID) | INTRAMUSCULAR | Status: DC

## 2021-11-06 MED ORDER — ASPIRIN 81 MG PO CHEW
81.0000 mg | CHEWABLE_TABLET | Freq: Every day | ORAL | Status: DC
  Administered 2021-11-07: 08:00:00 81 mg via ORAL
  Filled 2021-11-06: qty 1

## 2021-11-06 MED ORDER — CHLORTHALIDONE 25 MG PO TABS
12.5000 mg | ORAL_TABLET | Freq: Every day | ORAL | Status: DC
  Administered 2021-11-07: 08:00:00 12.5 mg via ORAL
  Filled 2021-11-06: qty 1

## 2021-11-06 MED ORDER — LORAZEPAM 1 MG PO TABS
0.0000 mg | ORAL_TABLET | Freq: Four times a day (QID) | ORAL | Status: DC
Start: 2021-11-06 — End: 2021-11-08
  Administered 2021-11-07 (×2): 2 mg via ORAL
  Filled 2021-11-06 (×2): qty 2

## 2021-11-06 MED ORDER — THIAMINE HCL 100 MG/ML IJ SOLN: 100.0000 mg | Freq: Every day | INTRAMUSCULAR | Status: DC

## 2021-11-06 MED ORDER — ATENOLOL 25 MG PO TABS
50.0000 mg | ORAL_TABLET | Freq: Every day | ORAL | Status: DC
  Administered 2021-11-07: 08:00:00 50 mg via ORAL
  Filled 2021-11-06: qty 2

## 2021-11-06 MED ORDER — PAROXETINE HCL 20 MG PO TABS
40.0000 mg | ORAL_TABLET | ORAL | Status: DC
  Administered 2021-11-07: 07:00:00 40 mg via ORAL
  Filled 2021-11-06: qty 2

## 2021-11-06 MED ORDER — THIAMINE HCL 100 MG PO TABS
100.0000 mg | ORAL_TABLET | Freq: Every day | ORAL | Status: DC
  Administered 2021-11-07: 08:00:00 100 mg via ORAL
  Filled 2021-11-06: qty 1

## 2021-11-06 MED ORDER — ROSUVASTATIN CALCIUM 5 MG PO TABS
5.0000 mg | ORAL_TABLET | Freq: Every day | ORAL | Status: DC
  Administered 2021-11-07: 09:00:00 5 mg via ORAL
  Filled 2021-11-06: qty 1

## 2021-11-06 MED ORDER — ATOMOXETINE HCL 40 MG PO CAPS
40.0000 mg | ORAL_CAPSULE | Freq: Every day | ORAL | Status: DC
  Filled 2021-11-06 (×2): qty 1

## 2021-11-06 MED ORDER — PANTOPRAZOLE SODIUM 40 MG PO TBEC
40.0000 mg | DELAYED_RELEASE_TABLET | Freq: Every day | ORAL | Status: DC
  Administered 2021-11-07: 08:00:00 40 mg via ORAL
  Filled 2021-11-06: qty 1

## 2021-11-06 MED ORDER — LORAZEPAM 2 MG/ML IJ SOLN: 0.0000 mg | Freq: Four times a day (QID) | INTRAMUSCULAR | Status: DC

## 2021-11-06 NOTE — ED Provider Notes (Signed)
[facility] EMERGENCY DEPARTMENT Provider Note   CSN: [id number] Arrival date & time: 11/06/21  1903     History Chief Complaint  Patient presents with   IVC   Medical Clearance    Renee [name] is a 55 y.o. female.  HPI Patient brought in under IVC.  Reportedly called 911 saying she was suicidal.  States she is not suicidal.  States she has been feeling bad due to family members death.  States her mother and brother both recently died.  States she would not actually hurt her self and is asking to go home now.  Had been IVC by police.  Does have history of alcohol abuse.  Had been off alcohol until recently.  Denies hallucinations.  Has not seen behavioral health.    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   Anxiety 09/11/2021   Grief reaction 09/11/2021   Alcohol dependence ([place]) 09/11/2021   Fatigue 08/24/2021   Alcohol abuse with intoxication ([place]) 08/12/2021   Alcohol withdrawal ([place]) 03/50/0938   Alcoholic ketoacidosis 18/29/9371   H/O atrial fibrillation without current medication 08/12/2021   Intractable vomiting 03/15/2021   Hyponatremia 03/15/2021   Aortic atherosclerosis ([place]) 02/16/2021   Insomnia 02/16/2021   Primary hypertension 10/18/2020   Depression, major, single episode, severe ([place]) 10/18/2020   Tobacco abuse 10/18/2020   Morbid obesity ([place]) 10/18/2020   GERD (gastroesophageal reflux disease) 10/18/2020    Past Surgical History:  Procedure Laterality Date   ABDOMINAL HYSTERECTOMY N/A    CHOLECYSTECTOMY     COLONOSCOPY N/A 01/26/2021   Procedure: COLONOSCOPY;  Surgeon: Rogene Houston, MD;  Location: [facility] ENDO [facility];  Service: Endoscopy;  Laterality: N/A;  AM   COLONOSCOPY N/A 09/14/2021   Procedure: COLONOSCOPY;  Surgeon: Rogene Houston, MD;  Location: [facility] ENDO SUITE;  Service: Endoscopy;  Laterality: N/A;  10:30   POLYPECTOMY  09/14/2021   Procedure: POLYPECTOMY;  Surgeon: Rogene Houston, MD;  Location: [facility] ENDO [facility];  Service: Endoscopy;;   SMALL INTESTINE SURGERY N/A    TONSILLECTOMY       OB History   No obstetric history on file.     Family History  Problem Relation Age of Onset   Hypertension Father     Social History   Tobacco Use   Smoking status: Former    Types: Cigarettes    Quit date: 2018    Years since quitting: 4.9   Smokeless tobacco: Never   Tobacco comments:    35 pack-year  Vaping Use   Vaping Use: Never used  Substance Use Topics   Alcohol use: Yes   Drug use: Never    Home Medications Prior to Admission medications   Medication Sig Start Date End Date Taking? Authorizing Provider  acetaminophen (TYLENOL) 500 MG tablet Take 1,000 mg by mouth every 6 (six) hours as needed for mild pain or headache.    [provider]  aspirin 81 MG chewable tablet Chew 1 tablet (81 mg total) by mouth daily. 09/15/21   Rehman, Mechele Dawley, MD  atenolol (TENORMIN) 50 MG tablet Take 1 tablet (50 mg total) by mouth daily. 08/29/21   Lindell Spar, MD  atomoxetine (STRATTERA) 40 MG capsule Take 1 capsule (40 mg total) by mouth  daily. 09/26/21   Lindell Spar, MD  chlorthalidone (HYGROTON) 25 MG tablet Take 0.5 tablets (12.5 mg total) by mouth daily. 08/18/21   Roxan Hockey, MD  cloNIDine (CATAPRES) 0.1 MG tablet Take 1 tablet by mouth once daily 09/11/21   Lindell Spar, MD  cyclobenzaprine (FLEXERIL) 10 MG tablet Take 1 tablet (10 mg total) by mouth 2 (two) times daily as needed for muscle spasms. 09/03/21   Marcello Fennel, PA-C  esomeprazole (NEXIUM) 20 MG capsule Take 20 mg by mouth daily.    [provider]  hydrOXYzine (VISTARIL) 25 MG capsule One to two capsules by mouth at bedtime as needed, for sleep 08/23/21   Fayrene Helper, MD  Multiple Vitamin  (MULTIVITAMIN WITH MINERALS) TABS tablet Take 1 tablet by mouth daily. 08/19/21   Roxan Hockey, MD  PARoxetine (PAXIL) 40 MG tablet Take 1 tablet (40 mg total) by mouth every morning. 08/23/21   Fayrene Helper, MD  rosuvastatin (CRESTOR) 5 MG tablet Take 1 tablet (5 mg total) by mouth daily. 02/16/21   Lindell Spar, MD  Semaglutide, 1 MG/DOSE, 4 MG/3ML SOPN Inject 1 mg as directed once a week. 10/27/21   Lindell Spar, MD  vitamin B-12 (CYANOCOBALAMIN) 1000 MCG tablet Take 1,000 mcg by mouth daily.    [provider]    Allergies    Librium [chlordiazepoxide]  Review of Systems   Review of Systems  Constitutional:  Positive for diaphoresis. Negative for appetite change.  Respiratory:  Negative for shortness of breath.   Cardiovascular:  Negative for chest pain.  Gastrointestinal:  Negative for abdominal pain.  Musculoskeletal:  Negative for back pain.  Skin:  Negative for rash.  Neurological:  Negative for weakness.  Psychiatric/Behavioral:  Positive for suicidal ideas.    Physical Exam Updated Vital Signs BP 115/77 (BP Location: Right Arm)    Pulse 99    Temp 98.2 F (36.8 C) (Oral)    Resp 18    Ht 5\' 6"  (1.676 m)    Wt 111.1 kg    SpO2 94%    BMI 39.54 kg/m   Physical Exam Vitals and nursing note reviewed.  Constitutional:      Appearance: She is diaphoretic.  HENT:     Head: Atraumatic.  Eyes:     Pupils: Pupils are equal, round, and reactive to light.  Cardiovascular:     Rate and Rhythm: Regular rhythm.  Chest:     Chest wall: No tenderness.  Abdominal:     Tenderness: There is no abdominal tenderness.  Musculoskeletal:        General: No tenderness.     Cervical back: Neck supple.  Skin:    General: Skin is warm.     Capillary Refill: Capillary refill takes less than 2 seconds.  Neurological:     Mental Status: She is alert and oriented to person, place, and time.  Psychiatric:     Comments: Patient is tearful.  Somewhat upset at being  here.    ED Results / Procedures / Treatments   Labs (all labs ordered are listed, but only abnormal results are displayed) Labs Reviewed  COMPREHENSIVE METABOLIC PANEL - Abnormal; Notable for the following components:      Result Value   CO2 19 (*)    Calcium 8.7 (*)    AST 97 (*)    ALT 62 (*)    Alkaline Phosphatase 135 (*)    Anion gap 16 (*)  All other components within normal limits  ETHANOL - Abnormal; Notable for the following components:   Alcohol, Ethyl (B) 248 (*)    All other components within normal limits  RESP PANEL BY RT-PCR (FLU A&B, COVID) ARPGX2  RAPID URINE DRUG SCREEN, HOSP PERFORMED  CBC WITH DIFFERENTIAL/PLATELET    EKG EKG Interpretation  Date/Time:  Monday November 06 2021 22:06:12 EST Ventricular Rate:  91 PR Interval:  166 QRS Duration: 92 QT Interval:  382 QTC Calculation: 469 R Axis:   77 Text Interpretation: Normal sinus rhythm Normal ECG Confirmed by Davonna Belling [phone]) on 11/07/2021 12:30:02 AM  Radiology No results found.  Procedures Procedures   Medications Ordered in ED Medications  LORazepam (ATIVAN) injection 0-4 mg (has no administration in time range)    Or  LORazepam (ATIVAN) tablet 0-4 mg (has no administration in time range)  LORazepam (ATIVAN) injection 0-4 mg (has no administration in time range)    Or  LORazepam (ATIVAN) tablet 0-4 mg (has no administration in time range)  thiamine tablet 100 mg (has no administration in time range)    Or  thiamine (B-1) injection 100 mg (has no administration in time range)  atenolol (TENORMIN) tablet 50 mg (has no administration in time range)  aspirin chewable tablet 81 mg (has no administration in time range)  atomoxetine (STRATTERA) capsule 40 mg (has no administration in time range)  chlorthalidone (HYGROTON) tablet 12.5 mg (has no administration in time range)  cloNIDine (CATAPRES) tablet 0.1 mg (has no administration in time range)  pantoprazole (PROTONIX) EC tablet  40 mg (has no administration in time range)  PARoxetine (PAXIL) tablet 40 mg (has no administration in time range)  rosuvastatin (CRESTOR) tablet 5 mg (has no administration in time range)  LORazepam (ATIVAN) tablet 1 mg (1 mg Oral Given 11/06/21 2120)    ED Course  I have reviewed the triage vital signs and the nursing notes.  Pertinent labs & imaging results that were available during my care of the patient were reviewed by me and considered in my medical decision making (see chart for details).    MDM Rules/Calculators/A&P                         Patient presents under IVC by police.  Had called 911 saying she was suicidal.  Difficulty dealing with deaths of family members.  States she does not want to hurt her self now.  States she made the call because she was upset.  States she would rather go home to see her husband, however she has been dealing with depression recently since the death.  With the suicidal statements and IVC I think she will benefit from being seen by psychiatry.  Has been drinking a little more recently also.  We will get lab work for medical clearance.  Lab work is returned.  Alcohol level elevated.  LFTs also mildly elevated likely due to alcohol use.  Patient is medically cleared.  Holding orders placed.  CIWA protocol started.  We will have patient seen by TTS.  First examination has been done after IVC by family member.     Final Clinical Impression(s) / ED Diagnoses Final diagnoses:  Suicidal ideation  Grief  Alcohol abuse    Rx / DC Orders ED Discharge Orders     None        Davonna Belling, MD 11/07/21 [phone]

## 2021-11-06 NOTE — ED Notes (Signed)
One bag of belongings placed on third shelf in locker room.

## 2021-11-06 NOTE — ED Triage Notes (Signed)
Pt to ED via law enforcement c/o IVC. Called 911 dispatch today stating she was suicidal .pt reports  recent deaths in family.  Pt irritable, loud, but  behavior redirectable.

## 2021-11-06 NOTE — ED Notes (Signed)
Pt is asking [employer] what will happen if she runs, stating she can out run them if she decides to. [employer] at bedside.

## 2021-11-06 NOTE — ED Notes (Signed)
Pt wanded by security. Pt is diaphoretic. Pt eventually states that she is an alcoholic - 3 bottles of wine a day- and might start going into withdrawals.

## 2021-11-06 NOTE — ED Notes (Signed)
Pt allowed to make one phone call to update family.

## 2021-11-06 NOTE — ED Notes (Signed)
Pt very uncooperative

## 2021-11-07 MED ORDER — LORAZEPAM 1 MG PO TABS
2.0000 mg | ORAL_TABLET | Freq: Once | ORAL | Status: AC
  Administered 2021-11-07: 08:00:00 2 mg via ORAL
  Filled 2021-11-07: qty 2

## 2021-11-07 MED ORDER — HYDROCODONE-ACETAMINOPHEN 5-325 MG PO TABS
1.0000 | ORAL_TABLET | Freq: Once | ORAL | Status: AC
  Administered 2021-11-07: 07:00:00 1 via ORAL
  Filled 2021-11-07: qty 1

## 2021-11-07 NOTE — ED Notes (Signed)
MD requested for additional ativan due to patients CIWA score. No new orders at this time

## 2021-11-07 NOTE — ED Provider Notes (Signed)
°  Physical Exam  BP (!) 144/95 (BP Location: Right Arm)    Pulse 83    Temp 98.2 F (36.8 C)    Resp 18    Ht 5\' 6"  (1.676 m)    Wt 111.1 kg    SpO2 96%    BMI 39.54 kg/m   Physical Exam  ED Course/Procedures     Procedures  MDM  Patient's been seen by psychiatry and cleared for discharge.  For outpatient follow-up resources have been given       Davonna Belling, MD 11/07/21 [phone]

## 2021-11-07 NOTE — BH Assessment (Addendum)
Comprehensive Clinical Assessment (CCA) Screening, Triage and Referral Note  11/07/2021 Renee [name] [id number]   Disposition: Per Thomes Lolling, NP patient does not meet inpatient criteria.  Outpatient treatment and grief support is recommended.  TOC to provide resources for pt.   The patient demonstrates the following risk factors for suicide: Chronic risk factors for suicide include: substance use disorder. Acute risk factors for suicide include: loss (financial, interpersonal, professional). Protective factors for this patient include: positive social support, responsibility to others (children, family), and coping skills. Considering these factors, the overall suicide risk at this point appears to be low. Patient is appropriate for outpatient follow up.  Per EDP: "Patient brought in under IVC.  Reportedly called 911 saying she was suicidal.  States she is not suicidal.  States she has been feeling bad due to family members death.  States her mother and brother both recently died.  States she would not actually hurt her self and is asking to go home now.  Had been IVC by police.  Does have history of alcohol abuse.  Had been off alcohol until recently.  Denies hallucinations."    Upon assessment, patient is calm, cooperative and AAOx5.  She denies current SI and states she doesn't "really remember" calling 911 to report SI.  She is aware that her husband filed IVC petition, and she is quite adamant that she is not suicidal and states, "I would not harm myself."  She recognizes that alcohol was a factor and states she really needs to stay away from alcohol.  She reports she has a long history of alcohol abuse, with several periods of sobriety.  She was most recently sober for almost three months before her mother passed on 09-30-21.  She also lost her brother to an overdose 1.5 weeks ago.  Her alcohol use has increased since to the point she has been drinking a "box of wine" daily for the past 2  weeks or more.  Patient denies hx of suicide attempts, however she does admit to making the statements when she has been drinking.  Patient denies HI and AVH.  Treatment options were discussed with patient and patient's husband.   Patient's husband, Delfino Lovett, was interviewed.  He reports patient has been drinking more regularly for the past few months.  He has spoken with patient and feels she would be safe if discharged.  He engaged in safety planing and has safety proofed the home. He is also willing to monitor patient more closely and support her efforts to schedule outpatient treatment and grief support. He is planning to pick patient up after work if he is unable to arrange transportation for her sooner.   Chief Complaint:  Chief Complaint  Patient presents with   IVC   Medical Clearance   Visit Diagnosis: Alcohol Use Disorder, moderate                             Depressive Disorder Unspecified  Flowsheet Row ED from 11/06/2021 in [facility] (Discharged) from 09/14/2021 in [facility] ED from 09/03/2021 in [place] [facility] Error: Q3, 4, or 5 should not be populated when Q2 is No No Risk No Risk      Form isn't populating score - Current Risk = Low  Patient Reported Information How did you hear about [place]? No data recorded What Is the Reason for Your Visit/Call Today? Patient presented  via law enforcement under IVC, initiated by husband. She called 911 dispatch stating she was suicidal.  She also reported she has had recent deaths in the family.   Pt irritable/loud on arrival and BAL was 258. Patient has consistently denied SI since she arrived to the ED.  How Long Has This Been Causing You Problems? 1-6 months  What Do You Feel Would Help You the Most Today? Alcohol or Drug Use Treatment; Treatment for Depression or other mood problem   Have You Recently Had Any Thoughts About Hurting Yourself? Yes  Are You  Planning to Commit Suicide/Harm Yourself At This time? No   Have you Recently Had Thoughts About [place]? No  Are You Planning to Harm Someone at This Time? No  Explanation: No data recorded  Have You Used Any Alcohol or Drugs in the Past 24 Hours? Yes  How Long Ago Did You Use Drugs or Alcohol? No data recorded What Did You Use and How Much? Alcohol - unknown amount of wine yesterday morninng PTA   Do You Currently Have a Therapist/Psychiatrist? No  Name of Therapist/Psychiatrist: No data recorded  Have You Been Recently Discharged From Any Office Practice or Programs? No  Explanation of Discharge From Practice/Program: No data recorded   CCA Screening Triage Referral Assessment Type of Contact: Tele-Assessment  Telemedicine Service Delivery: Telemedicine service delivery: This service was provided via telemedicine using a 2-way, interactive audio and video technology  Is this Initial or Reassessment? Initial Assessment  Date Telepsych consult ordered in [facility]:  11/06/21  Time Telepsych consult ordered in [facility]:  1047  Location of Assessment: [facility] ED  Provider Location: [facility] Assessment Services   Collateral Involvement: Husband, Richard, provided collateral.   Does Patient Have a [place]? No data recorded Name and Contact of Legal Guardian: No data recorded If Minor and Not Living with Parent(s), Who has Custody? No data recorded Is CPS involved or ever been involved? Never  Is APS involved or ever been involved? Never   Patient Determined To Be At Risk for Harm To Self or Others Based on Review of Patient Reported Information or Presenting Complaint? No  Method: No data recorded Availability of Means: No data recorded Intent: No data recorded Notification Required: No data recorded Additional Information for Danger to Others Potential: No data recorded Additional Comments for Danger to Others Potential: No data recorded Are  There Guns or Other Weapons in Your Home? No data recorded Types of Guns/Weapons: No data recorded Are These Weapons Safely Secured?                            No data recorded Who Could Verify You Are Able To Have These Secured: No data recorded Do You Have any Outstanding Charges, Pending Court Dates, Parole/Probation? No data recorded Contacted To Inform of Risk of Harm To Self or Others: Family/Significant Other: (Husband aware of safety concerns, and has engaged in safety planning with [facility].)   Does Patient Present under Involuntary Commitment? Yes  IVC Papers Initial File Date: 11/06/21   [place] of Residence: [place]   Patient Currently Receiving the Following Services: Not Receiving Services   Determination of Need: Routine (7 days)   Options For Referral: Medication Management; Outpatient Therapy   Discharge Disposition:     Fransico Meadow, [facility]

## 2021-11-07 NOTE — BH Assessment (Signed)
TTS clinician attempted to complete TTS assessment, however patient BAL 248. Per Cassell Smiles, RN, patient is intoxicated unable to participate in assessment at this time.

## 2021-11-07 NOTE — ED Provider Notes (Signed)
Emergency Medicine Observation Re-evaluation Note  Renee [name] is a 55 y.o. female, seen on rounds today.  Pt initially presented to the ED for complaints of IVC and Medical Clearance Currently, the patient is resting comfortably.  Physical Exam  BP (!) 150/87    Pulse 80    Temp 98.5 F (36.9 C) (Oral)    Resp 18    Ht 5\' 6"  (1.676 m)    Wt 111.1 kg    SpO2 95%    BMI 39.54 kg/m  Physical Exam General: Nontoxic appearing Cardiac: Normal heart rate Lungs: Normal respiratory Psych: Not responding to internal stimuli. Neurologic: Tremulous.  Lucid.  No dysarthria or aphasia.Marland Kitchen CIWA 8 at 8:10 AM by me.  ED Course / MDM  EKG:EKG Interpretation  Date/Time:  Monday November 06 2021 22:06:12 EST Ventricular Rate:  91 PR Interval:  166 QRS Duration: 92 QT Interval:  382 QTC Calculation: 469 R Axis:   77 Text Interpretation: Normal sinus rhythm Normal ECG Confirmed by Davonna Belling [phone]) on 11/07/2021 12:30:02 AM  I have reviewed the labs performed to date as well as medications administered while in observation.  Recent changes in the last 24 hours include she is becoming more sober, and tremulous, showing signs of alcohol withdrawal.  TTS consultation pending she was too intoxicated to talk to them earlier today.  Plan  Current plan is for TTS evaluation. [name] is under involuntary commitment.      Daleen Bo, MD 11/07/21 [phone]

## 2021-11-07 NOTE — ED Notes (Signed)
Pt d/c home with husband per MD order. Discharge summary reviewed, verbalize understanding. Pt denies SI . No s/s of acute distress noted at discharge. Ambulatory off unit.

## 2021-11-07 NOTE — ED Notes (Signed)
TTS machine at bedside for eval

## 2021-11-07 NOTE — ED Notes (Signed)
Patient currently in bathroom vomiting. MD notified

## 2021-11-17 ENCOUNTER — Telehealth: Payer: Self-pay

## 2021-11-17 NOTE — Telephone Encounter (Signed)
Patient called needs to know if she needs blood work drawn.  [facility] to see if she has had all her immunization titus.  Please give patient a call at [phone]. Would like this done ASAP.  Please contact patient at [phone].

## 2021-11-17 NOTE — Telephone Encounter (Signed)
Tried to call patient back, her mailbox is full to let her know provider will be back int he office Monday to ask about the immunization titus antibodies

## 2021-11-20 NOTE — Telephone Encounter (Signed)
LVM for pt to call the office.

## 2021-11-27 ENCOUNTER — Ambulatory Visit: Payer: Self-pay | Admitting: Nurse Practitioner

## 2021-11-29 ENCOUNTER — Telehealth: Payer: Self-pay

## 2021-11-29 NOTE — Telephone Encounter (Signed)
Patient called need a print out of flu vaccines. Will pick up in the office afternoon.

## 2021-11-29 NOTE — Telephone Encounter (Signed)
Printed out and left with front desk to provide to patient

## 2021-12-19 ENCOUNTER — Encounter (HOSPITAL_COMMUNITY): Payer: Self-pay

## 2021-12-19 ENCOUNTER — Other Ambulatory Visit (HOSPITAL_COMMUNITY): Payer: Self-pay

## 2021-12-19 DIAGNOSIS — Z87891 Personal history of nicotine dependence: Secondary | ICD-10-CM

## 2021-12-19 DIAGNOSIS — Z122 Encounter for screening for malignant neoplasm of respiratory organs: Secondary | ICD-10-CM

## 2021-12-19 NOTE — Progress Notes (Signed)
LDCT order placed per protocol °

## 2021-12-19 NOTE — Progress Notes (Signed)
LDCT scheduled for 2/17 at 1600.

## 2021-12-27 ENCOUNTER — Ambulatory Visit: Payer: 59 | Admitting: Internal Medicine

## 2021-12-29 ENCOUNTER — Ambulatory Visit (HOSPITAL_COMMUNITY): Payer: Self-pay

## 2022-03-12 ENCOUNTER — Other Ambulatory Visit: Payer: Self-pay | Admitting: Internal Medicine

## 2022-03-12 DIAGNOSIS — I10 Essential (primary) hypertension: Secondary | ICD-10-CM

## 2022-03-27 ENCOUNTER — Other Ambulatory Visit (HOSPITAL_COMMUNITY): Payer: Self-pay | Admitting: Family Medicine

## 2022-03-27 DIAGNOSIS — Z1231 Encounter for screening mammogram for malignant neoplasm of breast: Secondary | ICD-10-CM

## 2022-04-16 ENCOUNTER — Ambulatory Visit (HOSPITAL_COMMUNITY): Payer: Self-pay

## 2022-08-06 ENCOUNTER — Encounter: Payer: Self-pay | Admitting: *Deleted

## 2022-08-15 NOTE — Progress Notes (Signed)
Attempted to reach patient to reschedule missed LDCT. Unable to reach the patient directly. Detailed VM left asking that the patient return my call.

## 2022-11-06 IMAGING — MG MM DIGITAL SCREENING BILAT W/ TOMO AND CAD
8 series · 8 of 24 positions shown · non-contrast
Comparison: Previous exam(s).

CLINICAL DATA: Screening.

EXAM:
DIGITAL SCREENING BILATERAL MAMMOGRAM WITH TOMOSYNTHESIS AND CAD

[L MLO synth-2D]
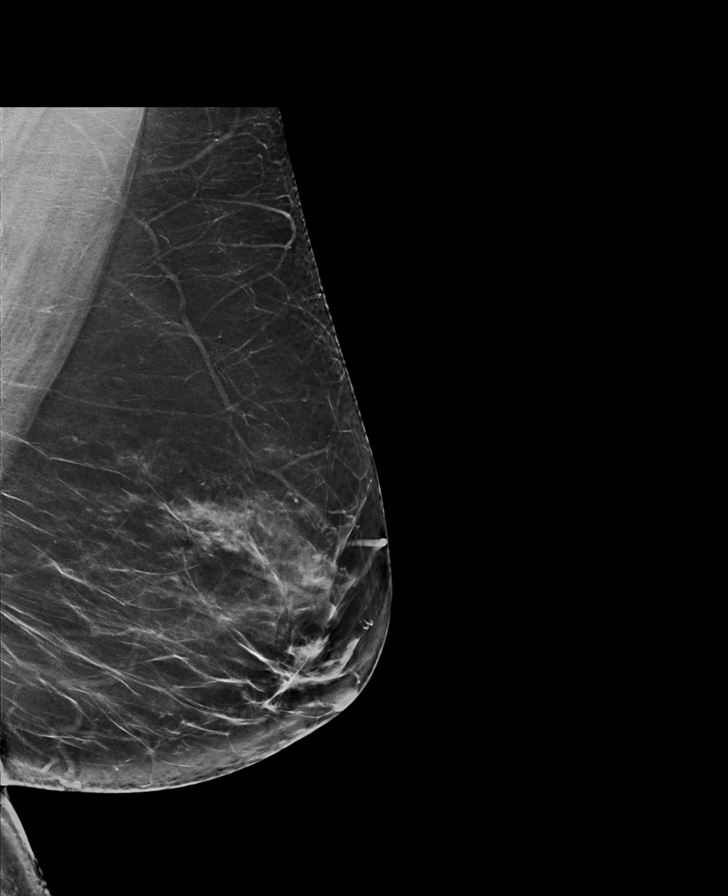

[L CC synth-2D]
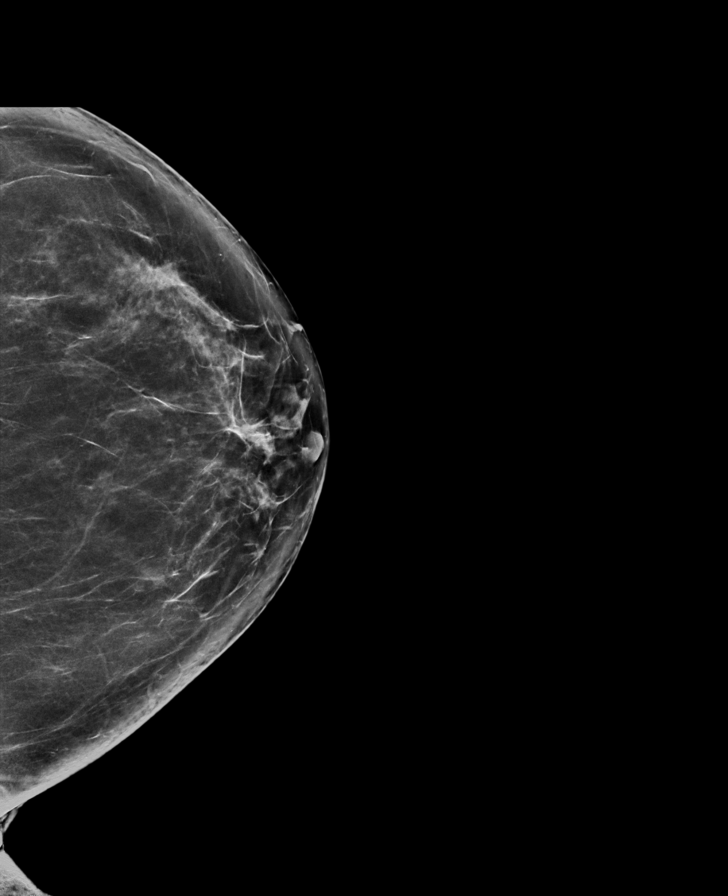

[R CC synth-2D]
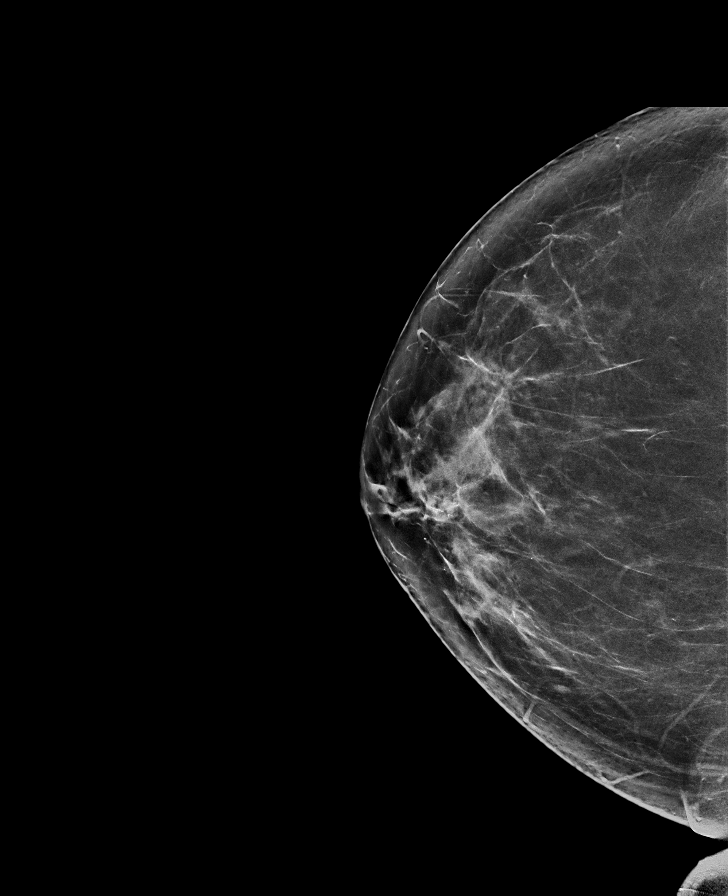

[R MLO synth-2D]
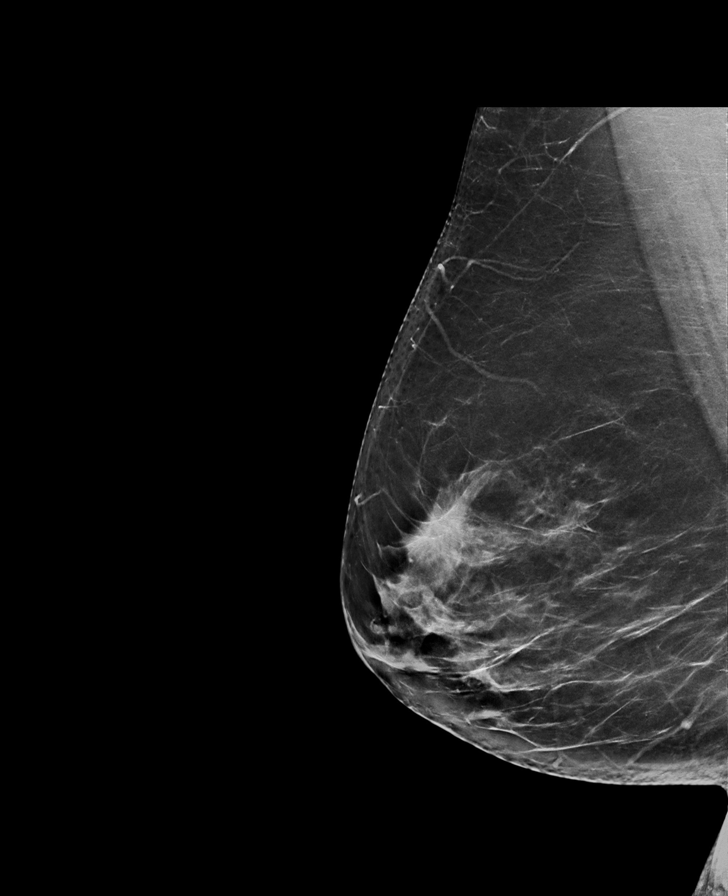

[R MLO tomo · tomo slice 43/85.0]
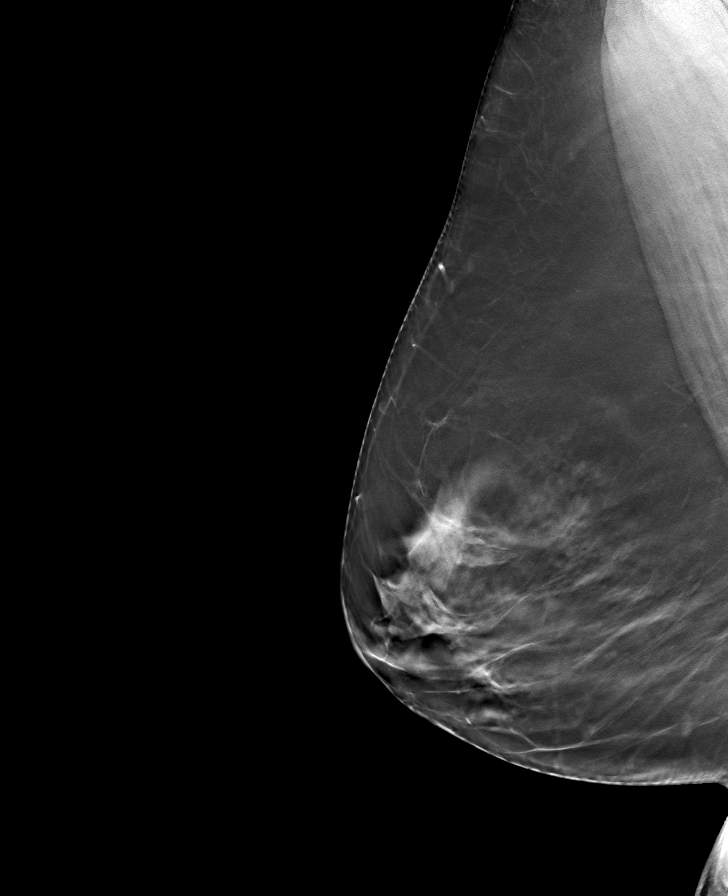

[L MLO tomo · tomo slice 43/86.0]
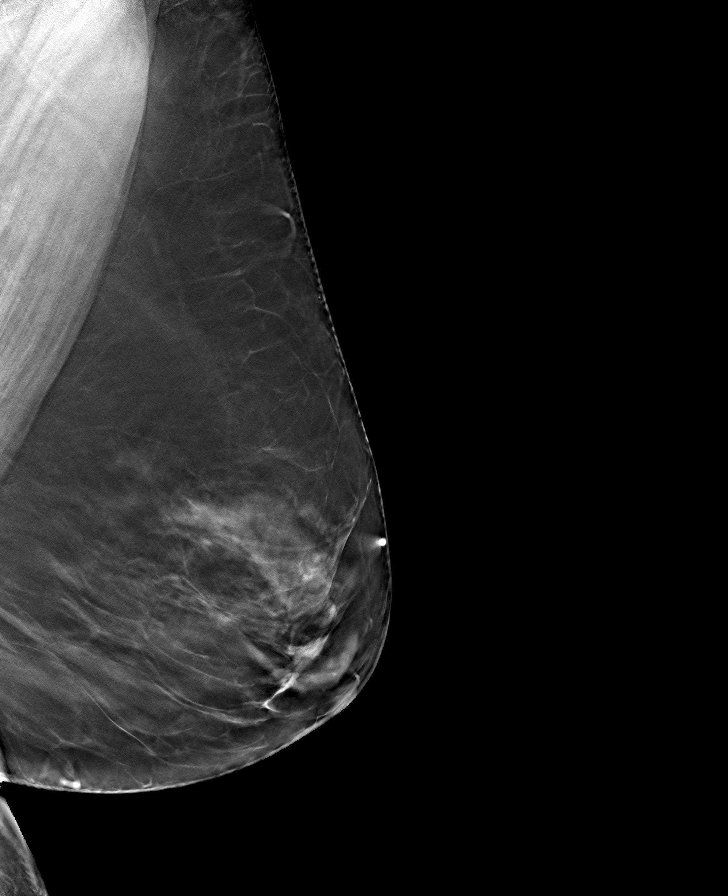

[R CC tomo · tomo slice 39/78.0]
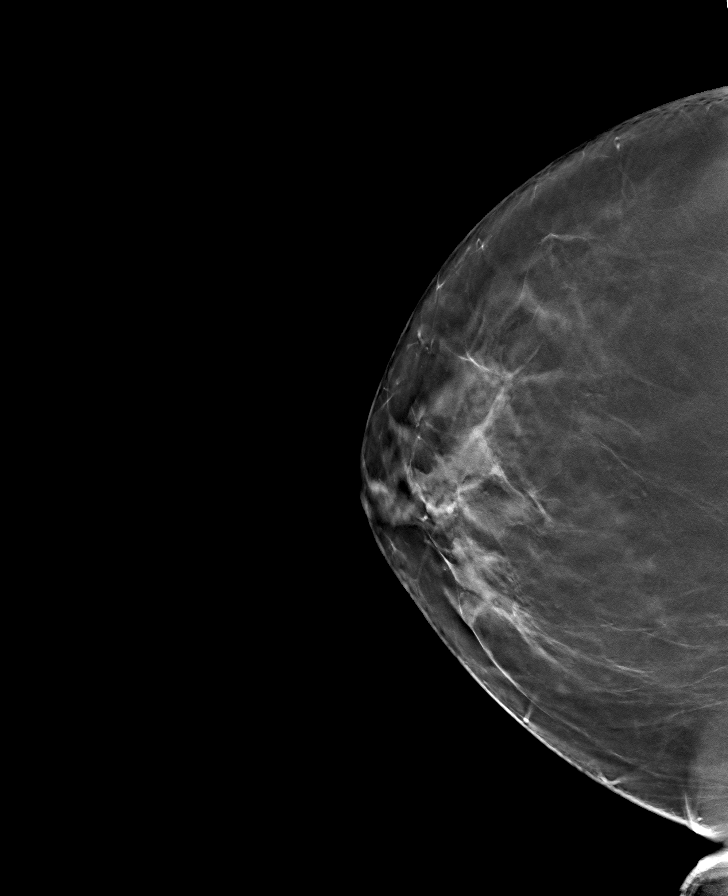

[L CC tomo · tomo slice 38/75.0]
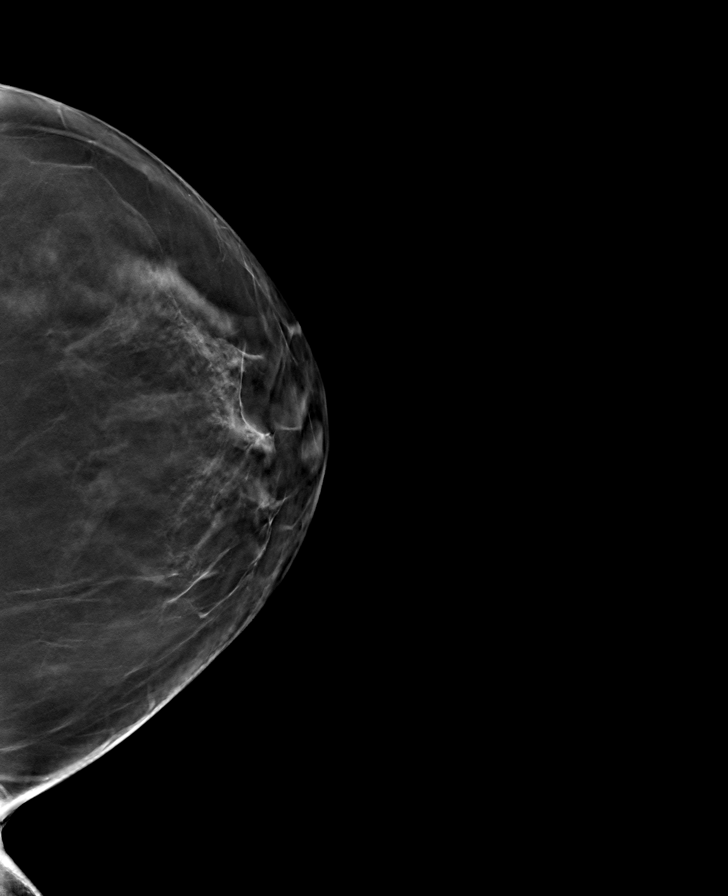

[8 of 24 positions shown; findings below may reference images not displayed]

ACR Breast Density Category b: There are scattered areas of
fibroglandular density.
FINDINGS: There are no findings suspicious for malignancy.
IMPRESSION: No mammographic evidence of malignancy. A result letter of this
screening mammogram will be mailed directly to the patient.

RECOMMENDATION:
Screening mammogram in one year. (Code:QV-L-PDX)

BI-RADS CATEGORY  1: Negative.

## 2022-11-15 NOTE — Progress Notes (Signed)
Attempted to reach patient regarding follow-up LDCT. Unable to reach the patient directly, detailed VM left asking that the patient return my call.  

## 2022-11-24 ENCOUNTER — Emergency Department (HOSPITAL_COMMUNITY)
Admission: EM | Admit: 2022-11-24 | Discharge: 2022-11-24 | Disposition: A | Discharge status: Home / Self Care | Payer: PRIVATE HEALTH INSURANCE | Attending: Emergency Medicine | Admitting: Emergency Medicine

## 2022-11-24 ENCOUNTER — Emergency Department (HOSPITAL_COMMUNITY): Payer: PRIVATE HEALTH INSURANCE

## 2022-11-24 DIAGNOSIS — R0981 Nasal congestion: Secondary | ICD-10-CM | POA: Diagnosis present

## 2022-11-24 DIAGNOSIS — U071 COVID-19: Secondary | ICD-10-CM | POA: Insufficient documentation

## 2022-11-24 DIAGNOSIS — Z79899 Other long term (current) drug therapy: Secondary | ICD-10-CM | POA: Insufficient documentation

## 2022-11-24 DIAGNOSIS — I10 Essential (primary) hypertension: Secondary | ICD-10-CM | POA: Insufficient documentation

## 2022-11-24 DIAGNOSIS — R519 Headache, unspecified: Secondary | ICD-10-CM

## 2022-11-24 DIAGNOSIS — Z7982 Long term (current) use of aspirin: Secondary | ICD-10-CM | POA: Diagnosis not present

## 2022-11-24 LAB — CBC
HCT: 36.7 % (ref 36.0–46.0)
Hemoglobin: 12.4 g/dL (ref 12.0–15.0)
MCH: 34.3 pg — ABNORMAL HIGH (ref 26.0–34.0)
MCHC: 33.8 g/dL (ref 30.0–36.0)
MCV: 101.7 fL — ABNORMAL HIGH (ref 80.0–100.0)
Platelets: 211 10*3/uL (ref 150–400)
RBC: 3.61 MIL/uL — ABNORMAL LOW (ref 3.87–5.11)
RDW: 13.2 % (ref 11.5–15.5)
WBC: 7.3 10*3/uL (ref 4.0–10.5)
nRBC: 0 % (ref 0.0–0.2)

## 2022-11-24 LAB — BASIC METABOLIC PANEL
Anion gap: 9 (ref 5–15)
BUN: 10 mg/dL (ref 6–20)
CO2: 21 mmol/L — ABNORMAL LOW (ref 22–32)
Calcium: 8.8 mg/dL — ABNORMAL LOW (ref 8.9–10.3)
Chloride: 103 mmol/L (ref 98–111)
Creatinine, Ser: 0.77 mg/dL (ref 0.44–1.00)
GFR, Estimated: >60 mL/min (ref >60)
Glucose, Bld: 106 mg/dL — ABNORMAL HIGH (ref 70–99)
Potassium: 3.7 mmol/L (ref 3.5–5.1)
Sodium: 133 mmol/L — ABNORMAL LOW (ref 135–145)

## 2022-11-24 LAB — RESP PANEL BY RT-PCR (RSV, FLU A&B, COVID)  RVPGX2
Influenza A by PCR: NEGATIVE
Influenza B by PCR: NEGATIVE
Resp Syncytial Virus by PCR: NEGATIVE
SARS Coronavirus 2 by RT PCR: POSITIVE — AB

## 2022-11-24 MED ORDER — DEXAMETHASONE SODIUM PHOSPHATE 10 MG/ML IJ SOLN
10.0000 mg | Freq: Once | INTRAMUSCULAR | Status: AC
  Administered 2022-11-24: 10 mg via INTRAVENOUS
  Filled 2022-11-24: qty 1

## 2022-11-24 MED ORDER — MORPHINE SULFATE (PF) 4 MG/ML IV SOLN
4.0000 mg | Freq: Once | INTRAVENOUS | Status: AC
  Administered 2022-11-24: 4 mg via INTRAVENOUS
  Filled 2022-11-24: qty 1

## 2022-11-24 MED ORDER — PROCHLORPERAZINE EDISYLATE 10 MG/2ML IJ SOLN
10.0000 mg | Freq: Once | INTRAMUSCULAR | Status: AC
  Administered 2022-11-24: 10 mg via INTRAVENOUS
  Filled 2022-11-24: qty 2

## 2022-11-24 MED ORDER — SODIUM CHLORIDE 0.9 % IV SOLN: INTRAVENOUS | Status: DC

## 2022-11-24 MED ORDER — SODIUM CHLORIDE 0.9 % IV BOLUS
1000.0000 mL | Freq: Once | INTRAVENOUS | Status: AC
  Administered 2022-11-24: 1000 mL via INTRAVENOUS

## 2022-11-24 MED ORDER — HYDROMORPHONE HCL 1 MG/ML IJ SOLN
1.0000 mg | Freq: Once | INTRAMUSCULAR | Status: DC
  Filled 2022-11-24: qty 1

## 2022-11-24 MED ORDER — PAXLOVID (300/100) 20 X 150 MG & 10 X 100MG PO TBPK: 3.0000 | ORAL_TABLET | Freq: Two times a day (BID) | ORAL | 0 refills | Status: AC

## 2022-11-24 NOTE — ED Notes (Signed)
Patient transported to CT 

## 2022-11-24 NOTE — ED Triage Notes (Signed)
Pt to ED reports positive COVID test yesterday, reports worsening symptoms such as cough, congestion, fever, generalized body aches, HA. Reports no relief with OTC meds

## 2022-11-24 NOTE — Discharge Instructions (Addendum)
You have been seen today for your complaint of COVID and headache. Your lab work was positive for [place]. Your imaging was reassuring. Your discharge medications include Paxlovid. SPEAK WITH YOUR PHARMACIST ABOUT TAKING THIS WITH YOUR OTHER MEDICATIONS BEFORE YOU BEGIN TAKING IT You may also take Claritin and Flonase for your symptoms.  You should monitor your temperature.  You may take Tylenol and ibuprofen for pain and fevers.  Follow dosing instructions on the bottle. Home care instructions are as follows:  Eat a normal diet.  Drink plenty of water. Follow up with: Your primary care provider in 1 week for reevaluation Please seek immediate medical care if you develop any of the following symptoms: You have trouble breathing. You have pain or pressure in your chest. You are confused. You have bluish lips and fingernails. You have trouble waking from sleep. You have symptoms that get worse. At this time there does not appear to be the presence of an emergent medical condition, however there is always the potential for conditions to change. Please read and follow the below instructions.  Do not take your medicine if  develop an itchy rash, swelling in your mouth or lips, or difficulty breathing; call 911 and seek immediate emergency medical attention if this occurs.  You may review your lab tests and imaging results in their entirety on your MyChart account.  Please discuss all results of fully with your primary care provider and other specialist at your follow-up visit.  Note: Portions of this text may have been transcribed using voice recognition software. Every effort was made to ensure accuracy; however, inadvertent computerized transcription errors may still be present.

## 2022-11-24 NOTE — ED Provider Notes (Signed)
[facility] EMERGENCY DEPARTMENT Provider Note   CSN: [id number] Arrival date & time: 11/24/22  1712     History  Chief Complaint  Patient presents with   Covid Positive   HPI Renee [name] is a 57 y.o. female with hypertension with headache, generalized bodyaches, congestion cough which started 2 days ago.  She took a COVID test at home which was positive.  States she has had COVID in the past and states her symptoms have been much worse this time.  Endorses midsternal chest pain that is worse with coughing and mild shortness of breath is worse with exertion.  States that headache is the worst he is ever had.  Denies photophobia and phonophobia.  HPI     Home Medications Prior to Admission medications   Medication Sig Start Date End Date Taking? Authorizing Provider  acetaminophen (TYLENOL) 500 MG tablet Take 1,000 mg by mouth every 6 (six) hours as needed for mild pain or headache.    [provider]  aspirin 81 MG chewable tablet Chew 1 tablet (81 mg total) by mouth daily. 09/15/21   Rogene Houston, MD  atenolol (TENORMIN) 50 MG tablet Take 1 tablet by mouth once daily 03/12/22   Lindell Spar, MD  atomoxetine (STRATTERA) 40 MG capsule Take 1 capsule (40 mg total) by mouth daily. 09/26/21   Lindell Spar, MD  chlorthalidone (HYGROTON) 25 MG tablet Take 0.5 tablets (12.5 mg total) by mouth daily. 08/18/21   Roxan Hockey, MD  cloNIDine (CATAPRES) 0.1 MG tablet Take 1 tablet by mouth once daily 09/11/21   Lindell Spar, MD  cyclobenzaprine (FLEXERIL) 10 MG tablet Take 1 tablet (10 mg total) by mouth 2 (two) times daily as needed for muscle spasms. 09/03/21   Marcello Fennel, PA-C  esomeprazole (NEXIUM) 20 MG capsule Take 20 mg by mouth daily.    [provider]  hydrOXYzine (VISTARIL) 25 MG capsule One to two capsules by mouth at bedtime as needed, for sleep 08/23/21   Fayrene Helper, MD  Multiple Vitamin (MULTIVITAMIN WITH MINERALS) TABS tablet  Take 1 tablet by mouth daily. 08/19/21   Roxan Hockey, MD  PARoxetine (PAXIL) 40 MG tablet Take 1 tablet (40 mg total) by mouth every morning. 08/23/21   Fayrene Helper, MD  rosuvastatin (CRESTOR) 5 MG tablet Take 1 tablet (5 mg total) by mouth daily. 02/16/21   Lindell Spar, MD  Semaglutide, 1 MG/DOSE, 4 MG/3ML SOPN Inject 1 mg as directed once a week. 10/27/21   Lindell Spar, MD  traZODone (DESYREL) 100 MG tablet Take 100 mg by mouth at bedtime as needed. 10/14/21   [provider]  vitamin B-12 (CYANOCOBALAMIN) 1000 MCG tablet Take 2,000 mcg by mouth daily.    [provider]      Allergies    Librium [chlordiazepoxide]    Review of Systems   Review of Systems  Constitutional:  Positive for fever.  HENT:  Positive for congestion.   Respiratory:  Positive for cough and shortness of breath.   Cardiovascular:  Positive for chest pain.    Physical Exam   Vitals:   11/24/22 1739  BP: (!) 162/116  Pulse: 95  Resp: 20  Temp: 99.8 F (37.7 C)  SpO2: 96%    CONSTITUTIONAL:  ill-appearing, NAD NEURO:  GCS 15. Speech is goal oriented. No deficits appreciated to CN III-XII; symmetric eyebrow raise, no facial drooping, tongue midline. Patient has equal grip strength bilaterally with 5/5  strength against resistance in all major muscle groups bilaterally. Sensation to light touch intact. Patient moves extremities without ataxia. Normal finger-nose-finger. Patient ambulatory with steady gait.  EYES:  eyes equal and reactive ENT/NECK:  Supple, no stridor  CARDIO:  regular rate and rhythm, appears well-perfused  PULM:  No respiratory distress, CTAB MSK/SPINE:  No gross deformities, no edema, moves all extremities  SKIN:  no rash, atraumatic   *Additional and/or pertinent findings included in MDM below    ED Results / Procedures / Treatments   Labs (all labs ordered are listed, but only abnormal results are displayed) Labs Reviewed  RESP PANEL BY RT-PCR  (RSV, FLU A&B, COVID)  [id number]  BASIC METABOLIC PANEL  CBC    EKG None  Radiology No results found.  Procedures Procedures    Medications Ordered in ED Medications  sodium chloride 0.9 % bolus 1,000 mL (has no administration in time range)  prochlorperazine (COMPAZINE) injection 10 mg (has no administration in time range)  dexamethasone (DECADRON) injection 10 mg (has no administration in time range)  morphine (PF) 4 MG/ML injection 4 mg (has no administration in time range)    ED Course/ Medical Decision Making/ A&P                             Medical Decision Making  57 year old female who is ill appearing but otherwise hemodynamically stable presenting for 2 days of cough congestion, fever chest pain and shortness of breath.  Physical exam was overall reassuring.  Differential diagnosis for this complaint includes COVID, flu, ACS, pneumonia, and stroke.  I have strong suspicion that symptoms are consistent with COVID and/or flu.  I have a low suspicion for ACS but given patient has endorsing chest pain with exertional shortness of breath, will evaluate with EKG and monitor vital signs.  Also have low suspicion for pneumonia but will assess with chest x-ray.  Will treat headache for now with headache cocktail and evaluate for possible stroke with head CT.  I have signed out patient to [place] who will continue to evaluate and follow-up on results which will dictate disposition.  Likely discharge and follow-up with PCP.         Final Clinical Impression(s) / ED Diagnoses Final diagnoses:  Nonintractable headache, unspecified chronicity pattern, unspecified headache type    Rx / DC Orders ED Discharge Orders     None         Harriet Pho, PA-C 11/24/22 1859    Noemi Chapel, MD 11/24/22 2229

## 2022-11-24 NOTE — ED Provider Notes (Signed)
  Physical Exam  BP (!) 162/116   Pulse 95   Temp 99.8 F (37.7 C) (Oral)   Resp 20   Ht '5\' 5"'$  (1.651 m)   Wt 99.8 kg   SpO2 96%   BMI 36.61 kg/m   Physical Exam Vitals and nursing note reviewed.  Constitutional:      General: She is not in acute distress.    Appearance: Normal appearance. She is normal weight. She is not ill-appearing.  HENT:     Head: Normocephalic and atraumatic.     Nose: Congestion and rhinorrhea present.  Pulmonary:     Effort: Pulmonary effort is normal. No respiratory distress.  Abdominal:     General: Abdomen is flat.  Musculoskeletal:        General: Normal range of motion.     Cervical back: Neck supple.  Skin:    General: Skin is warm and dry.  Neurological:     Mental Status: She is alert and oriented to person, place, and time.  Psychiatric:        Mood and Affect: Mood normal.        Behavior: Behavior normal.     Procedures  Procedures  ED Course / MDM    Medical Decision Making Amount and/or Complexity of Data Reviewed Labs: ordered. Radiology: ordered.  Risk Prescription drug management.  This patient presents to the ED for concern of headache and positive home COVID test, this involves an extensive number of treatment options, and is a complaint that carries with it a high risk of complications and morbidity.  The differential diagnosis includes flu, COVID, RSV, other URI  Additional history obtained from: Nursing notes from this visit.  I reviewed and interpreted labs which include: BMP, CBC, respiratory panel.  Slight hyponatremia 133 which was treated with IV fluids. COVID positive.  I ordered imaging studies including chest x-ray, CT head I independently visualized and interpreted imaging which showed normal I agree with the radiologist interpretation  Care assumed at shift change from Joanette Gula, PA-C.  Please see his note for full HPI.  In short, patient is a 57 year old female who presents ED for evaluation of  less than 48 hours of flulike symptoms.  She took a home COVID test yesterday and reports it was positive.  Presented today for acute onset severe headache refractory to treatment at home. She did test positive for COVID. Her symptoms improved with treatment in the ED. Her labs are unremarkable. Her images are unremarkable. She qualifies for Paxlovid. She was encouraged to speak with her pharmacist prior to beginning Paxlovid to determine which medicine she should stop taking. She was given return precautions. Stable at discharge.  At this time there does not appear to be any evidence of an acute emergency medical condition and the patient appears stable for discharge with appropriate outpatient follow up. Diagnosis was discussed with patient who verbalizes understanding of care plan and is agreeable to discharge. I have discussed return precautions with patient and husband who verbalizes understanding. Patient encouraged to follow-up with their PCP within 1 week. All questions answered.  Patient's case discussed with Dr. Sabra Heck who agrees with plan to discharge with follow-up.   Note: Portions of this report may have been transcribed using voice recognition software. Every effort was made to ensure accuracy; however, inadvertent computerized transcription errors may still be present.     Nehemiah Massed 11/24/22 2153    Noemi Chapel, MD 11/24/22 2229

## 2022-12-26 NOTE — Progress Notes (Signed)
Attempted to reach patient regarding follow-up LDCT. Unable to reach the patient directly, detailed VM left asking that the patient return my call.

## 2023-02-06 ENCOUNTER — Inpatient Hospital Stay (HOSPITAL_COMMUNITY)
Admission: EM | Admit: 2023-02-06 | Discharge: 2023-02-11 | DRG: 897 | Disposition: A | Discharge status: Home / Self Care | Payer: No Typology Code available for payment source | Attending: Family Medicine | Admitting: Family Medicine

## 2023-02-06 ENCOUNTER — Encounter (HOSPITAL_COMMUNITY): Payer: Self-pay | Admitting: Emergency Medicine

## 2023-02-06 ENCOUNTER — Other Ambulatory Visit: Payer: Self-pay

## 2023-02-06 DIAGNOSIS — Z90710 Acquired absence of both cervix and uterus: Secondary | ICD-10-CM | POA: Diagnosis not present

## 2023-02-06 DIAGNOSIS — R000 Tachycardia, unspecified: Secondary | ICD-10-CM | POA: Diagnosis present

## 2023-02-06 DIAGNOSIS — R7401 Elevation of levels of liver transaminase levels: Secondary | ICD-10-CM | POA: Diagnosis present

## 2023-02-06 DIAGNOSIS — Z79899 Other long term (current) drug therapy: Secondary | ICD-10-CM | POA: Diagnosis not present

## 2023-02-06 DIAGNOSIS — G479 Sleep disorder, unspecified: Secondary | ICD-10-CM | POA: Diagnosis not present

## 2023-02-06 DIAGNOSIS — E8729 Other acidosis: Secondary | ICD-10-CM | POA: Diagnosis present

## 2023-02-06 DIAGNOSIS — Z888 Allergy status to other drugs, medicaments and biological substances status: Secondary | ICD-10-CM | POA: Diagnosis not present

## 2023-02-06 DIAGNOSIS — E669 Obesity, unspecified: Secondary | ICD-10-CM | POA: Diagnosis present

## 2023-02-06 DIAGNOSIS — F10930 Alcohol use, unspecified with withdrawal, uncomplicated: Principal | ICD-10-CM

## 2023-02-06 DIAGNOSIS — Z6834 Body mass index (BMI) 34.0-34.9, adult: Secondary | ICD-10-CM | POA: Diagnosis not present

## 2023-02-06 DIAGNOSIS — F10129 Alcohol abuse with intoxication, unspecified: Secondary | ICD-10-CM | POA: Diagnosis present

## 2023-02-06 DIAGNOSIS — I480 Paroxysmal atrial fibrillation: Secondary | ICD-10-CM

## 2023-02-06 DIAGNOSIS — I959 Hypotension, unspecified: Secondary | ICD-10-CM | POA: Diagnosis not present

## 2023-02-06 DIAGNOSIS — Z8249 Family history of ischemic heart disease and other diseases of the circulatory system: Secondary | ICD-10-CM

## 2023-02-06 DIAGNOSIS — E860 Dehydration: Secondary | ICD-10-CM | POA: Diagnosis present

## 2023-02-06 DIAGNOSIS — R45851 Suicidal ideations: Secondary | ICD-10-CM

## 2023-02-06 DIAGNOSIS — E66811 Obesity, class 1: Secondary | ICD-10-CM | POA: Diagnosis present

## 2023-02-06 DIAGNOSIS — F419 Anxiety disorder, unspecified: Secondary | ICD-10-CM | POA: Diagnosis present

## 2023-02-06 DIAGNOSIS — F10230 Alcohol dependence with withdrawal, uncomplicated: Secondary | ICD-10-CM | POA: Diagnosis present

## 2023-02-06 DIAGNOSIS — I10 Essential (primary) hypertension: Secondary | ICD-10-CM | POA: Diagnosis present

## 2023-02-06 DIAGNOSIS — F17290 Nicotine dependence, other tobacco product, uncomplicated: Secondary | ICD-10-CM | POA: Diagnosis present

## 2023-02-06 DIAGNOSIS — F10229 Alcohol dependence with intoxication, unspecified: Secondary | ICD-10-CM | POA: Diagnosis present

## 2023-02-06 DIAGNOSIS — F10939 Alcohol use, unspecified with withdrawal, unspecified: Secondary | ICD-10-CM | POA: Diagnosis present

## 2023-02-06 DIAGNOSIS — F32A Depression, unspecified: Secondary | ICD-10-CM | POA: Diagnosis present

## 2023-02-06 DIAGNOSIS — F17200 Nicotine dependence, unspecified, uncomplicated: Secondary | ICD-10-CM

## 2023-02-06 DIAGNOSIS — K219 Gastro-esophageal reflux disease without esophagitis: Secondary | ICD-10-CM | POA: Diagnosis present

## 2023-02-06 DIAGNOSIS — Y908 Blood alcohol level of 240 mg/100 ml or more: Secondary | ICD-10-CM | POA: Diagnosis present

## 2023-02-06 DIAGNOSIS — Z7982 Long term (current) use of aspirin: Secondary | ICD-10-CM | POA: Diagnosis not present

## 2023-02-06 DIAGNOSIS — I4891 Unspecified atrial fibrillation: Secondary | ICD-10-CM | POA: Diagnosis not present

## 2023-02-06 DIAGNOSIS — F10932 Alcohol use, unspecified with withdrawal with perceptual disturbance: Secondary | ICD-10-CM | POA: Diagnosis not present

## 2023-02-06 DIAGNOSIS — I700 Atherosclerosis of aorta: Secondary | ICD-10-CM

## 2023-02-06 DIAGNOSIS — Z9049 Acquired absence of other specified parts of digestive tract: Secondary | ICD-10-CM

## 2023-02-06 LAB — CBC
HCT: 45.2 % (ref 36.0–46.0)
Hemoglobin: 15.6 g/dL — ABNORMAL HIGH (ref 12.0–15.0)
MCH: 35.4 pg — ABNORMAL HIGH (ref 26.0–34.0)
MCHC: 34.5 g/dL (ref 30.0–36.0)
MCV: 102.5 fL — ABNORMAL HIGH (ref 80.0–100.0)
Platelets: 321 10*3/uL (ref 150–400)
RBC: 4.41 MIL/uL (ref 3.87–5.11)
RDW: 12.9 % (ref 11.5–15.5)
WBC: 10.7 10*3/uL — ABNORMAL HIGH (ref 4.0–10.5)
nRBC: 0 % (ref 0.0–0.2)

## 2023-02-06 LAB — COMPREHENSIVE METABOLIC PANEL
ALT: 64 U/L — ABNORMAL HIGH (ref 0–44)
AST: 243 U/L — ABNORMAL HIGH (ref 15–41)
Albumin: 4.5 g/dL (ref 3.5–5.0)
Alkaline Phosphatase: 129 U/L — ABNORMAL HIGH (ref 38–126)
Anion gap: 19 — ABNORMAL HIGH (ref 5–15)
BUN: 13 mg/dL (ref 6–20)
CO2: 13 mmol/L — ABNORMAL LOW (ref 22–32)
Calcium: 8.7 mg/dL — ABNORMAL LOW (ref 8.9–10.3)
Chloride: 106 mmol/L (ref 98–111)
Creatinine, Ser: 0.81 mg/dL (ref 0.44–1.00)
GFR, Estimated: >60 mL/min (ref >60)
Glucose, Bld: 111 mg/dL — ABNORMAL HIGH (ref 70–99)
Potassium: 3.6 mmol/L (ref 3.5–5.1)
Sodium: 138 mmol/L (ref 135–145)
Total Bilirubin: 0.9 mg/dL (ref 0.3–1.2)
Total Protein: 7.5 g/dL (ref 6.5–8.1)

## 2023-02-06 LAB — MAGNESIUM: Magnesium: 2 mg/dL (ref 1.7–2.4)

## 2023-02-06 LAB — ETHANOL: Alcohol, Ethyl (B): 351 mg/dL (ref <10)

## 2023-02-06 LAB — PHOSPHORUS: Phosphorus: 3.8 mg/dL (ref 2.5–4.6)

## 2023-02-06 MED ORDER — NICOTINE 21 MG/24HR TD PT24
21.0000 mg | MEDICATED_PATCH | Freq: Every day | TRANSDERMAL | Status: DC
  Administered 2023-02-06 – 2023-02-11 (×6): 21 mg via TRANSDERMAL
  Filled 2023-02-06 (×6): qty 1

## 2023-02-06 MED ORDER — AMIODARONE HCL IN DEXTROSE 360-4.14 MG/200ML-% IV SOLN
60.0000 mg/h | INTRAVENOUS | Status: AC
  Administered 2023-02-07: 60 mg/h via INTRAVENOUS
  Filled 2023-02-06 (×3): qty 200

## 2023-02-06 MED ORDER — PANTOPRAZOLE SODIUM 40 MG PO TBEC
40.0000 mg | DELAYED_RELEASE_TABLET | Freq: Every day | ORAL | Status: DC
  Administered 2023-02-06 – 2023-02-11 (×6): 40 mg via ORAL
  Filled 2023-02-06 (×6): qty 1

## 2023-02-06 MED ORDER — ADULT MULTIVITAMIN W/MINERALS CH
1.0000 | ORAL_TABLET | Freq: Every day | ORAL | Status: DC
  Administered 2023-02-07 – 2023-02-11 (×5): 1 via ORAL
  Filled 2023-02-06 (×6): qty 1

## 2023-02-06 MED ORDER — ONDANSETRON HCL 4 MG/2ML IJ SOLN
4.0000 mg | Freq: Four times a day (QID) | INTRAMUSCULAR | Status: DC | PRN
  Administered 2023-02-07 – 2023-02-08 (×3): 4 mg via INTRAVENOUS
  Filled 2023-02-06 (×3): qty 2

## 2023-02-06 MED ORDER — HYDROXYZINE PAMOATE 25 MG PO CAPS: 25.0000 mg | ORAL_CAPSULE | Freq: Every evening | ORAL | Status: DC | PRN

## 2023-02-06 MED ORDER — LORAZEPAM 1 MG PO TABS
1.0000 mg | ORAL_TABLET | ORAL | Status: DC | PRN
  Administered 2023-02-06: 4 mg via ORAL
  Administered 2023-02-06: 3 mg via ORAL
  Filled 2023-02-06: qty 4
  Filled 2023-02-06: qty 3

## 2023-02-06 MED ORDER — OXYCODONE HCL 5 MG PO TABS
5.0000 mg | ORAL_TABLET | ORAL | Status: DC | PRN
  Administered 2023-02-07 – 2023-02-10 (×4): 5 mg via ORAL
  Filled 2023-02-06 (×4): qty 1

## 2023-02-06 MED ORDER — TRAZODONE HCL 50 MG PO TABS
100.0000 mg | ORAL_TABLET | Freq: Every evening | ORAL | Status: DC | PRN
  Administered 2023-02-08 – 2023-02-09 (×2): 100 mg via ORAL
  Filled 2023-02-06 (×2): qty 2

## 2023-02-06 MED ORDER — ADULT MULTIVITAMIN W/MINERALS CH
1.0000 | ORAL_TABLET | Freq: Every day | ORAL | Status: DC
  Administered 2023-02-06: 1 via ORAL
  Filled 2023-02-06: qty 1

## 2023-02-06 MED ORDER — THIAMINE HCL 100 MG/ML IJ SOLN
100.0000 mg | Freq: Every day | INTRAMUSCULAR | Status: DC
  Administered 2023-02-06: 100 mg via INTRAVENOUS
  Filled 2023-02-06: qty 2

## 2023-02-06 MED ORDER — LORAZEPAM 1 MG PO TABS: 1.0000 mg | ORAL_TABLET | ORAL | Status: DC | PRN

## 2023-02-06 MED ORDER — ACETAMINOPHEN 325 MG PO TABS
650.0000 mg | ORAL_TABLET | Freq: Once | ORAL | Status: AC
  Administered 2023-02-06: 650 mg via ORAL
  Filled 2023-02-06: qty 2

## 2023-02-06 MED ORDER — DIPHENHYDRAMINE HCL 50 MG/ML IJ SOLN
25.0000 mg | Freq: Once | INTRAMUSCULAR | Status: AC
  Administered 2023-02-06: 25 mg via INTRAVENOUS
  Filled 2023-02-06: qty 1

## 2023-02-06 MED ORDER — LORAZEPAM 2 MG/ML IJ SOLN
1.0000 mg | INTRAMUSCULAR | Status: AC | PRN
  Administered 2023-02-06: 3 mg via INTRAVENOUS
  Administered 2023-02-08 (×2): 2 mg via INTRAVENOUS
  Administered 2023-02-08: 3 mg via INTRAVENOUS
  Administered 2023-02-08 – 2023-02-09 (×5): 2 mg via INTRAVENOUS
  Administered 2023-02-09: 3 mg via INTRAVENOUS
  Administered 2023-02-09: 4 mg via INTRAVENOUS
  Administered 2023-02-09: 2 mg via INTRAVENOUS
  Filled 2023-02-06 (×3): qty 1
  Filled 2023-02-06: qty 2
  Filled 2023-02-06 (×2): qty 1
  Filled 2023-02-06: qty 2
  Filled 2023-02-06: qty 1
  Filled 2023-02-06 (×2): qty 2
  Filled 2023-02-06 (×2): qty 1

## 2023-02-06 MED ORDER — FOLIC ACID 1 MG PO TABS
1.0000 mg | ORAL_TABLET | Freq: Every day | ORAL | Status: DC
  Administered 2023-02-06 – 2023-02-11 (×6): 1 mg via ORAL
  Filled 2023-02-06 (×6): qty 1

## 2023-02-06 MED ORDER — HALOPERIDOL LACTATE 5 MG/ML IJ SOLN: 2.0000 mg | Freq: Four times a day (QID) | INTRAMUSCULAR | Status: DC | PRN

## 2023-02-06 MED ORDER — LORAZEPAM 2 MG/ML IJ SOLN
0.0000 mg | Freq: Two times a day (BID) | INTRAMUSCULAR | Status: DC
  Administered 2023-02-08: 1 mg via INTRAVENOUS
  Administered 2023-02-09: 3 mg via INTRAVENOUS
  Filled 2023-02-06 (×2): qty 1
  Filled 2023-02-06: qty 2

## 2023-02-06 MED ORDER — AMIODARONE HCL IN DEXTROSE 360-4.14 MG/200ML-% IV SOLN
30.0000 mg/h | INTRAVENOUS | Status: DC
  Administered 2023-02-07: 60 mg/h via INTRAVENOUS

## 2023-02-06 MED ORDER — SODIUM CHLORIDE 0.9 % IV SOLN: INTRAVENOUS | Status: DC

## 2023-02-06 MED ORDER — HEPARIN SODIUM (PORCINE) 5000 UNIT/ML IJ SOLN
5000.0000 [IU] | Freq: Three times a day (TID) | INTRAMUSCULAR | Status: DC
  Administered 2023-02-06 – 2023-02-08 (×5): 5000 [IU] via SUBCUTANEOUS
  Filled 2023-02-06 (×5): qty 1

## 2023-02-06 MED ORDER — ONDANSETRON HCL 4 MG/2ML IJ SOLN
4.0000 mg | Freq: Once | INTRAMUSCULAR | Status: AC
  Administered 2023-02-06: 4 mg via INTRAVENOUS
  Filled 2023-02-06: qty 2

## 2023-02-06 MED ORDER — ONDANSETRON HCL 4 MG PO TABS: 4.0000 mg | ORAL_TABLET | Freq: Four times a day (QID) | ORAL | Status: DC | PRN

## 2023-02-06 MED ORDER — CHLORTHALIDONE 25 MG PO TABS
12.5000 mg | ORAL_TABLET | Freq: Every day | ORAL | Status: DC
  Filled 2023-02-06 (×2): qty 0.5
  Filled 2023-02-06: qty 1

## 2023-02-06 MED ORDER — CLONIDINE HCL 0.1 MG PO TABS
0.1000 mg | ORAL_TABLET | Freq: Every day | ORAL | Status: DC
  Filled 2023-02-06: qty 1

## 2023-02-06 MED ORDER — LORAZEPAM 1 MG PO TABS
1.0000 mg | ORAL_TABLET | ORAL | Status: AC | PRN
  Administered 2023-02-07: 1 mg via ORAL
  Administered 2023-02-07: 3 mg via ORAL
  Administered 2023-02-07: 1 mg via ORAL
  Administered 2023-02-07: 3 mg via ORAL
  Administered 2023-02-07: 1 mg via ORAL
  Administered 2023-02-07: 2 mg via ORAL
  Administered 2023-02-07: 4 mg via ORAL
  Administered 2023-02-07 (×2): 3 mg via ORAL
  Administered 2023-02-07 – 2023-02-09 (×2): 2 mg via ORAL
  Administered 2023-02-09 (×2): 1 mg via ORAL
  Filled 2023-02-06 (×2): qty 3
  Filled 2023-02-06: qty 1
  Filled 2023-02-06: qty 2
  Filled 2023-02-06 (×2): qty 3
  Filled 2023-02-06 (×2): qty 2
  Filled 2023-02-06 (×4): qty 1
  Filled 2023-02-06: qty 4

## 2023-02-06 MED ORDER — IBUPROFEN 400 MG PO TABS
400.0000 mg | ORAL_TABLET | Freq: Four times a day (QID) | ORAL | Status: DC | PRN
  Administered 2023-02-11: 400 mg via ORAL
  Filled 2023-02-06: qty 1

## 2023-02-06 MED ORDER — ATENOLOL 25 MG PO TABS
50.0000 mg | ORAL_TABLET | Freq: Every day | ORAL | Status: DC
  Filled 2023-02-06: qty 2

## 2023-02-06 MED ORDER — LORAZEPAM 2 MG/ML IJ SOLN
0.0000 mg | Freq: Four times a day (QID) | INTRAMUSCULAR | Status: DC
  Administered 2023-02-07: 4 mg via INTRAVENOUS
  Administered 2023-02-07 – 2023-02-08 (×4): 2 mg via INTRAVENOUS
  Administered 2023-02-08: 1 mg via INTRAVENOUS
  Filled 2023-02-06: qty 2
  Filled 2023-02-06 (×5): qty 1

## 2023-02-06 MED ORDER — METOCLOPRAMIDE HCL 5 MG/ML IJ SOLN
10.0000 mg | Freq: Once | INTRAMUSCULAR | Status: AC
  Administered 2023-02-06: 10 mg via INTRAVENOUS
  Filled 2023-02-06: qty 2

## 2023-02-06 MED ORDER — ASPIRIN 81 MG PO CHEW
81.0000 mg | CHEWABLE_TABLET | Freq: Every day | ORAL | Status: DC
  Administered 2023-02-06 – 2023-02-11 (×6): 81 mg via ORAL
  Filled 2023-02-06 (×6): qty 1

## 2023-02-06 MED ORDER — AMIODARONE LOAD VIA INFUSION
150.0000 mg | Freq: Once | INTRAVENOUS | Status: AC
  Administered 2023-02-07: 150 mg via INTRAVENOUS
  Filled 2023-02-06: qty 83.34

## 2023-02-06 MED ORDER — THIAMINE MONONITRATE 100 MG PO TABS
100.0000 mg | ORAL_TABLET | Freq: Every day | ORAL | Status: DC
  Administered 2023-02-07 – 2023-02-11 (×5): 100 mg via ORAL
  Filled 2023-02-06 (×6): qty 1

## 2023-02-06 MED ORDER — METOPROLOL TARTRATE 5 MG/5ML IV SOLN
2.5000 mg | Freq: Once | INTRAVENOUS | Status: AC
  Administered 2023-02-06: 2.5 mg via INTRAVENOUS
  Filled 2023-02-06: qty 5

## 2023-02-06 MED ORDER — ROSUVASTATIN CALCIUM 10 MG PO TABS
5.0000 mg | ORAL_TABLET | Freq: Every day | ORAL | Status: DC
  Administered 2023-02-07 – 2023-02-11 (×5): 5 mg via ORAL
  Filled 2023-02-06 (×6): qty 1

## 2023-02-06 MED ORDER — SODIUM CHLORIDE 0.9 % IV BOLUS
1000.0000 mL | Freq: Once | INTRAVENOUS | Status: AC
  Administered 2023-02-06: 1000 mL via INTRAVENOUS

## 2023-02-06 MED ORDER — KETOROLAC TROMETHAMINE 15 MG/ML IJ SOLN
15.0000 mg | Freq: Once | INTRAMUSCULAR | Status: AC
  Administered 2023-02-06: 15 mg via INTRAVENOUS
  Filled 2023-02-06: qty 1

## 2023-02-06 MED ORDER — CHLORHEXIDINE GLUCONATE CLOTH 2 % EX PADS
6.0000 | MEDICATED_PAD | Freq: Every day | CUTANEOUS | Status: DC
  Administered 2023-02-06 – 2023-02-11 (×5): 6 via TOPICAL

## 2023-02-06 MED ORDER — PAROXETINE HCL 20 MG PO TABS
40.0000 mg | ORAL_TABLET | ORAL | Status: DC
  Administered 2023-02-07 – 2023-02-11 (×5): 40 mg via ORAL
  Filled 2023-02-06 (×5): qty 2

## 2023-02-06 MED ORDER — LACTATED RINGERS IV BOLUS
1000.0000 mL | Freq: Once | INTRAVENOUS | Status: AC
  Administered 2023-02-06: 1000 mL via INTRAVENOUS

## 2023-02-06 MED ORDER — CYCLOBENZAPRINE HCL 10 MG PO TABS: 10.0000 mg | ORAL_TABLET | Freq: Two times a day (BID) | ORAL | Status: DC | PRN
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
## 2023-02-06 NOTE — ED Notes (Signed)
Pt pulled monitoring cables off as well as pulled IV out. Monitoring equip placed back on pt. IV site bleeding controlled.

## 2023-02-06 NOTE — Progress Notes (Signed)
Heart rate jumped to 180s.  New EKG shows A-fib.  Holding off anticoagulation at this time as patient would not be a good candidate for anticoagulation given that she has a low risk CHA2DS2-VASc and a problem with alcohol.  Giving 1 dose of metoprolol at this time IV.  Blood pressure is on the lower end with a systolic of 123456.  Patient is already being admitted to stepdown so if her heart rate should continue to be high we will start amiodarone.  A-fib is likely due to alcohol.  Will get echo in the a.m.

## 2023-02-06 NOTE — Assessment & Plan Note (Signed)
-   Bicarb 13 - Gap 19 - Most likely related to alcohol - Continue IV fluids and detox - Trend in a.m.

## 2023-02-06 NOTE — Assessment & Plan Note (Signed)
-   AST 243, ALT 64 - Likely related to alcohol binge - Ultrasound right upper quadrant in the a.m. - Acute hepatitis panel with morning labs

## 2023-02-06 NOTE — H&P (Signed)
History and Physical    Patient: Renee [name] [id number] DOB: 1965-12-07 DOA: 02/06/2023 DOS: the patient was seen and examined on 02/06/2023 PCP: Ludwig Clarks, FNP  Patient coming from: Home  Chief Complaint:  Chief Complaint  Patient presents with   Alcohol Problem   HPI: Renee [name] is a 57 y.o. female with medical history significant of alcohol dependence, nicotine dependence, depression, anxiety, hypertension, history of atrial fibrillation-not medicated, and more presents the ED with a chief complaint of alcohol withdrawal.  Patient reports that she is here because she wants to quit.  She reports that she wants to quit because she likes alcohol less than she likes suicidal ideation.  This is how she phrases it.  Patient reports that she is been having suicidal ideation, with the last thoughts happening this morning.  Patient reports she wants to live.  She reports she wants to live for her daughter whom she adopted when she was a day old.  She reports that she is proud of her daughter and she wants to continue being around for her life.  Patient reports her suicidal ideation involved a knife.  She would not give more details than that.  She has no history of suicide attempts.  She has no history of hospitalization for suicidal ideation.  Patient does have a history of depression and she is on Paxil at home.  Patient reports that she usually drinks a box of wine per day.  Husband clarifies that this is only when she is binging.  She has a binge about every 6 months.  In between binges she drinks 2 glasses of wine per night.  This binge has been going on for couple of weeks already.  Her last drink was this morning on the day of presentation.  She drank 2 bottles of wine today prior to arrival.  Her alcohol level is 351.  Patient reports that she has a headache.  Its on the right side of her head and it starts in the front and radiates backwards.  She cannot answer to characterize the  pain.  She reports she had some nausea with dry heaves.  She has been tremulous at home, but had no seizure-like activity.  Patient is very anxious.  Patient has no other complaints at this time.  Patient uses a vape with nicotine.  She does not use illicit drugs.  She is vaccinated for COVID and flu.  Patient is full code. Review of Systems: As mentioned in the history of present illness. All other systems reviewed and are negative. Past Medical History:  Diagnosis Date   Alcohol abuse    Depression    History of total abdominal hysterectomy    Hypertension    PAF (paroxysmal atrial fibrillation) ([place])    Past Surgical History:  Procedure Laterality Date   CHOLECYSTECTOMY     COLONOSCOPY N/A 01/26/2021   Procedure: COLONOSCOPY;  Surgeon: Rogene Houston, MD;  Location: [facility];  Service: Endoscopy;  Laterality: N/A;  AM   COLONOSCOPY N/A 09/14/2021   Procedure: COLONOSCOPY;  Surgeon: Rogene Houston, MD;  Location: [facility];  Service: Endoscopy;  Laterality: N/A;  10:30   POLYPECTOMY  09/14/2021   Procedure: POLYPECTOMY;  Surgeon: Rogene Houston, MD;  Location: [facility];  Service: Endoscopy;;   SMALL INTESTINE SURGERY N/A    TONSILLECTOMY     TOTAL ABDOMINAL HYSTERECTOMY     Social History:  reports that she quit smoking about 6  years ago. Her smoking use included cigarettes. She has never used smokeless tobacco. She reports current alcohol use. She reports that she does not use drugs.  Allergies  Allergen Reactions   Librium [Chlordiazepoxide] Other (See Comments)    Does not like the way it makes her feel    Family History  Problem Relation Age of Onset   Hypertension Father     Prior to Admission medications   Medication Sig Start Date End Date Taking? Authorizing Provider  acetaminophen (TYLENOL) 500 MG tablet Take 1,000 mg by mouth every 6 (six) hours as needed for mild pain or headache.   Yes [provider]  amphetamine-dextroamphetamine  (ADDERALL) 30 MG tablet Take 0.5 tablets by mouth See admin instructions. Take 1/2 tablet by mouth in the morning and 1/2 at noon   Yes [provider]  aspirin 81 MG chewable tablet Chew 1 tablet (81 mg total) by mouth daily. 09/15/21  Yes Rehman, Mechele Dawley, MD  atenolol (TENORMIN) 50 MG tablet Take 1 tablet by mouth once daily 03/12/22  Yes Patel, Colin Broach, MD  esomeprazole (NEXIUM) 20 MG capsule Take 20 mg by mouth daily.   Yes [provider]  ibuprofen (ADVIL) 200 MG tablet Take 400 mg by mouth every 6 (six) hours as needed for moderate pain.   Yes [provider]  lisinopril (ZESTRIL) 40 MG tablet Take 1 tablet by mouth daily.   Yes [provider]  ondansetron (ZOFRAN) 4 MG tablet Take 1 tablet every 8 hours by oral route as needed for 10 days. 05/21/22  Yes [provider]  PARoxetine (PAXIL) 40 MG tablet Take 1 tablet (40 mg total) by mouth every morning. 08/23/21  Yes Fayrene Helper, MD  amphetamine-dextroamphetamine (ADDERALL) 15 MG tablet Take 1 tablet by mouth 2 (two) times daily. Patient not taking: Reported on 02/06/2023    [provider]  ARIPiprazole (ABILIFY) 10 MG tablet Take 1 tablet by mouth daily. Patient not taking: Reported on 02/06/2023 11/19/22   [provider]  chlorthalidone (HYGROTON) 25 MG tablet Take 0.5 tablets (12.5 mg total) by mouth daily. Patient not taking: Reported on 02/06/2023 08/18/21   Roxan Hockey, MD  cloNIDine (CATAPRES) 0.1 MG tablet Take 1 tablet by mouth once daily Patient not taking: Reported on 02/06/2023 09/11/21   Lindell Spar, MD  cloNIDine (CATAPRES) 0.2 MG tablet Take by mouth. Patient not taking: Reported on 02/06/2023    [provider]  cyclobenzaprine (FLEXERIL) 10 MG tablet Take 1 tablet (10 mg total) by mouth 2 (two) times daily as needed for muscle spasms. Patient not taking: Reported on 02/06/2023 09/03/21   Marcello Fennel, PA-C  hydrOXYzine (VISTARIL) 25 MG  capsule One to two capsules by mouth at bedtime as needed, for sleep Patient not taking: Reported on 02/06/2023 08/23/21   Fayrene Helper, MD  lisinopril (ZESTRIL) 20 MG tablet Take 20 mg by mouth daily. Patient not taking: Reported on 02/06/2023    [provider]  Multiple Vitamin (MULTIVITAMIN WITH MINERALS) TABS tablet Take 1 tablet by mouth daily. Patient not taking: Reported on 02/06/2023 08/19/21   Roxan Hockey, MD  rosuvastatin (CRESTOR) 5 MG tablet Take 1 tablet (5 mg total) by mouth daily. Patient not taking: Reported on 02/06/2023 02/16/21   Lindell Spar, MD  Semaglutide, 1 MG/DOSE, 4 MG/3ML SOPN Inject 1 mg as directed once a week. Patient not taking: Reported on 02/06/2023 10/27/21   Lindell Spar, MD  traZODone [facility])  100 MG tablet Take 100 mg by mouth at bedtime as needed. Patient not taking: Reported on 02/06/2023 10/14/21   [provider]  vitamin B-12 (CYANOCOBALAMIN) 1000 MCG tablet Take 2,000 mcg by mouth daily. Patient not taking: Reported on 02/06/2023    [provider]    Physical Exam: Vitals:   02/06/23 1830 02/06/23 1841 02/06/23 1930 02/06/23 2007  BP:  128/84 135/76 (!) 124/94  Pulse: (!) 109 (!) 110 (!) 111 (!) 118  Resp: 15 17 14 19   Temp:      TempSrc:      SpO2: 91% 93% 92% 97%  Weight:       1.  General: Patient lying supine in bed,  no acute distress   2. Psychiatric: Alert and oriented x 3, patient is anxious, and distracted-having to be asked the same question several times before getting an answer, taking her arm away from the nurse try to get an IV several times during 1 attempt and IV, behavior normal for situation, pleasant and cooperative with exam   3. Neurologic: Speech and language are normal, face is symmetric, moves all 4 extremities voluntarily, at baseline without acute deficits on limited exam   4. HEENMT:  Head is atraumatic, normocephalic, pupils reactive to light, neck is supple, trachea is  midline, mucous membranes are moist   5. Respiratory : Lungs are clear to auscultation bilaterally without wheezing, rhonchi, rales, no cyanosis, no increase in work of breathing or accessory muscle use   6. Cardiovascular : Heart rate tachycardic, rhythm is regular, no murmurs, rubs or gallops, positive for peripheral edema, peripheral pulses palpated   7. Gastrointestinal:  Abdomen is soft, nondistended, nontender to palpation bowel sounds active, no masses or organomegaly palpated   8. Skin:  Skin is warm, dry and intact without rashes, acute lesions, or ulcers on limited exam   9.Musculoskeletal:  No acute deformities or trauma, no asymmetry in tone, positive for peripheral edema, peripheral pulses palpated, no tenderness to palpation in the extremities  Data Reviewed: In the ED Temp 98.2, heart rate 109-130, respiratory rate 15-20, blood pressure 128/84-144/104, satting 91-99% No leukocytosis with white blood cell count of 10.7, hemoglobin 15.6, platelets 321 Chemistry shows a bicarb of 13, gap of 19, alk phos 229, AST 243, ALT 64 Alcohol level 351 EKG shows a heart rate of 130, sinus tach, QTc 4 and 61 Patient was given folic acid, Toradol, 1 L LR, 1 L normal saline, Reglan, multivitamin, Zofran, Benadryl, Tylenol in the ED She required 6 mg of Ativan in the ED Admission requested for alcohol detox   Assessment and Plan: * Alcohol withdrawal ([place]) - Usually drinks a box of wine per day - Last drink was the a.m. of the 27th - She had to bottles of wine today - She has been on this binge for couple of weeks - She has not been like this every 6 months - She is wanting to quit -History of tremulousness in the past when she is trying to quit, no history of seizures - CIWA protocol - Continue to monitor in the ICU in case patient should require Precedex  Transaminitis - AST 243, ALT 64 - Likely related to alcohol binge - Ultrasound right upper quadrant in the a.m. - Acute  hepatitis panel with morning labs  Nicotine dependence due to vaping non-tobacco product - Nicotine patch  Suicidal ideation - Patient reports suicidal ideation with a knife - So far she is here voluntarily, but if she  tries to leave she will likely need to be IVC - Patient will require psych evaluation prior to discharge - She has no history of suicide attempt or hospitalization for SI  Anxiety - Patient is on Ativan per CIWA protocol - Continue Vistaril - Continue Paxil - Continue to monitor  Alcoholic ketoacidosis - Bicarb 13 - Gap 19 - Most likely related to alcohol - Continue IV fluids and detox - Trend in a.m.  GERD (gastroesophageal reflux disease) - Continue PPI  Primary hypertension - Continue atenolol, chlorthalidone, clonidine      Advance Care Planning:   Code Status: Full Code  Consults: TOC  Family Communication: Husband at bedside  Severity of Illness: The appropriate patient status for this patient is INPATIENT. Inpatient status is judged to be reasonable and necessary in order to provide the required intensity of service to ensure the patient's safety. The patient's presenting symptoms, physical exam findings, and initial radiographic and laboratory data in the context of their chronic comorbidities is felt to place them at high risk for further clinical deterioration. Furthermore, it is not anticipated that the patient will be medically stable for discharge from the hospital within 2 midnights of admission.   * I certify that at the point of admission it is my clinical judgment that the patient will require inpatient hospital care spanning beyond 2 midnights from the point of admission due to high intensity of service, high risk for further deterioration and high frequency of surveillance required.*  Author: Rolla Plate, DO 02/06/2023 9:08 PM  For on call review [email or web address].

## 2023-02-06 NOTE — Assessment & Plan Note (Addendum)
Continue with hydroxyzine, and paroxetine,

## 2023-02-06 NOTE — Assessment & Plan Note (Addendum)
With dialysis borderline hypotensive, Discontinuing clonidine, she just was not listed on med list with hospice services -Discontinuing losartan, increasing her Toprol Xl at 100 mg  - PRN Hydralazin  Continue blood pressure monitoring.

## 2023-02-06 NOTE — ED Notes (Signed)
2 Rns have attempted at establishing an IV.  3rd RN attempting now

## 2023-02-06 NOTE — Assessment & Plan Note (Addendum)
-   Patient reports suicidal ideation with a knife - So far she is here voluntarily, but if she tries to leave she will likely need to be IVC Consulted psychiatry, will need admission to behavioral health when medically stable.  Continue one to one suicidal precautions.

## 2023-02-06 NOTE — ED Notes (Signed)
HR jumped to 188. Provider made aware.  2L [place] placed on pt for comfort. Pt's O2 at 91% on RA. Pt's O2 now at 94%

## 2023-02-06 NOTE — Assessment & Plan Note (Signed)
-  Nicotine patch 

## 2023-02-06 NOTE — Assessment & Plan Note (Addendum)
Acute alcohol withdrawal.  Tremors, no other signs of DTs at this point. Alcoholic ketoacidosis (resolved).   IV Lorazepam requirements have decreased --Tolerated well diazepam 5 mg bid.  This am continue to have tremors, on ambulation still having difficulty.   Plan to increase diazepam to 10 mg po bid.  Continue with lorazepam as needed per CIWA protocol.  Continue multivitamins, including thiamine   Monitor and replete electrolytes  Neuro checks per unit protocol.  Considering high use of benzodiazepines, will keep under close monitoring in the stepdown unit.

## 2023-02-06 NOTE — ED Triage Notes (Signed)
Pt requesting detox from alcohol. Pt states she has been drunk for 3 days off boxed wine and wants to quit. Has had 1 drink today and has been dry heaving all morning. Pt denies wanting to harm herself however did cut wrist last night while drunk. Pt has 3 superficial cuts to right wrist. Pt lost 2 family members in past year and has been struggling to stay sober. Spouse with pt at this time.   Complains of headache.

## 2023-02-06 NOTE — Social Work (Signed)
This CSW has added SUD resources for IP & OP. This CSW also added the [facility] which is open 7 days a week 24 hours and offers walk in options for Therapy etc.

## 2023-02-06 NOTE — Discharge Instructions (Signed)
Please follow up with these resources.

## 2023-02-06 NOTE — Assessment & Plan Note (Signed)
Continue with pantoprazole/.  

## 2023-02-07 ENCOUNTER — Inpatient Hospital Stay (HOSPITAL_COMMUNITY): Payer: No Typology Code available for payment source

## 2023-02-07 DIAGNOSIS — I480 Paroxysmal atrial fibrillation: Secondary | ICD-10-CM

## 2023-02-07 DIAGNOSIS — F10932 Alcohol use, unspecified with withdrawal with perceptual disturbance: Secondary | ICD-10-CM

## 2023-02-07 DIAGNOSIS — E669 Obesity, unspecified: Secondary | ICD-10-CM

## 2023-02-07 DIAGNOSIS — I4891 Unspecified atrial fibrillation: Secondary | ICD-10-CM

## 2023-02-07 LAB — ECHOCARDIOGRAM COMPLETE
AR max vel: 1.98 cm2
AV Area VTI: 1.83 cm2
AV Area mean vel: 1.99 cm2
AV Mean grad: 7 mmHg
AV Peak grad: 14.4 mmHg
Ao pk vel: 1.9 m/s
Area-P 1/2: 4.21 cm2
Est EF: >75
Height: 69 in
MV M vel: 1.39 m/s
MV Peak grad: 7.7 mmHg
S' Lateral: 2 cm
Weight: 3767.22 oz

## 2023-02-07 LAB — RAPID URINE DRUG SCREEN, HOSP PERFORMED
Amphetamines: NOT DETECTED
Barbiturates: NOT DETECTED
Benzodiazepines: POSITIVE — AB
Cocaine: NOT DETECTED
Opiates: NOT DETECTED
Tetrahydrocannabinol: NOT DETECTED

## 2023-02-07 LAB — CBC
HCT: 40 % (ref 36.0–46.0)
Hemoglobin: 13.3 g/dL (ref 12.0–15.0)
MCH: 35.3 pg — ABNORMAL HIGH (ref 26.0–34.0)
MCHC: 33.3 g/dL (ref 30.0–36.0)
MCV: 106.1 fL — ABNORMAL HIGH (ref 80.0–100.0)
Platelets: 199 10*3/uL (ref 150–400)
RBC: 3.77 MIL/uL — ABNORMAL LOW (ref 3.87–5.11)
RDW: 13 % (ref 11.5–15.5)
WBC: 6.3 10*3/uL (ref 4.0–10.5)
nRBC: 0 % (ref 0.0–0.2)

## 2023-02-07 LAB — COMPREHENSIVE METABOLIC PANEL
ALT: 57 U/L — ABNORMAL HIGH (ref 0–44)
AST: 210 U/L — ABNORMAL HIGH (ref 15–41)
Albumin: 3.6 g/dL (ref 3.5–5.0)
Alkaline Phosphatase: 106 U/L (ref 38–126)
Anion gap: 11 (ref 5–15)
BUN: 14 mg/dL (ref 6–20)
CO2: 18 mmol/L — ABNORMAL LOW (ref 22–32)
Calcium: 8.4 mg/dL — ABNORMAL LOW (ref 8.9–10.3)
Chloride: 109 mmol/L (ref 98–111)
Creatinine, Ser: 0.7 mg/dL (ref 0.44–1.00)
GFR, Estimated: >60 mL/min (ref >60)
Glucose, Bld: 96 mg/dL (ref 70–99)
Potassium: 3.7 mmol/L (ref 3.5–5.1)
Sodium: 138 mmol/L (ref 135–145)
Total Bilirubin: 1.2 mg/dL (ref 0.3–1.2)
Total Protein: 6.2 g/dL — ABNORMAL LOW (ref 6.5–8.1)

## 2023-02-07 LAB — HEPATITIS PANEL, ACUTE
HCV Ab: NONREACTIVE
Hep A IgM: NONREACTIVE
Hep B C IgM: NONREACTIVE
Hepatitis B Surface Ag: NONREACTIVE

## 2023-02-07 LAB — MRSA NEXT GEN BY PCR, NASAL: MRSA by PCR Next Gen: NOT DETECTED

## 2023-02-07 LAB — PROTIME-INR
INR: 1 (ref 0.8–1.2)
Prothrombin Time: 12.8 seconds (ref 11.4–15.2)

## 2023-02-07 LAB — HIV ANTIBODY (ROUTINE TESTING W REFLEX): HIV Screen 4th Generation wRfx: NONREACTIVE

## 2023-02-07 LAB — MAGNESIUM: Magnesium: 1.8 mg/dL (ref 1.7–2.4)

## 2023-02-07 MED ORDER — LOSARTAN POTASSIUM 25 MG PO TABS
25.0000 mg | ORAL_TABLET | Freq: Every day | ORAL | Status: DC
  Administered 2023-02-07 – 2023-02-08 (×2): 25 mg via ORAL
  Filled 2023-02-07 (×2): qty 1

## 2023-02-07 MED ORDER — ORAL CARE MOUTH RINSE: 15.0000 mL | OROMUCOSAL | Status: DC | PRN

## 2023-02-07 MED ORDER — AMPHETAMINE-DEXTROAMPHETAMINE 10 MG PO TABS
15.0000 mg | ORAL_TABLET | Freq: Two times a day (BID) | ORAL | Status: DC
  Administered 2023-02-07 – 2023-02-10 (×6): 15 mg via ORAL
  Filled 2023-02-07 (×6): qty 2

## 2023-02-07 MED ORDER — METOPROLOL TARTRATE 25 MG PO TABS
25.0000 mg | ORAL_TABLET | Freq: Three times a day (TID) | ORAL | Status: DC
  Administered 2023-02-07 – 2023-02-09 (×9): 25 mg via ORAL
  Filled 2023-02-07 (×9): qty 1

## 2023-02-07 MED ORDER — PERFLUTREN LIPID MICROSPHERE
1.0000 mL | INTRAVENOUS | Status: AC | PRN
  Administered 2023-02-07: 3 mL via INTRAVENOUS

## 2023-02-07 MED ORDER — DEXTROSE IN LACTATED RINGERS 5 % IV SOLN: INTRAVENOUS | Status: DC

## 2023-02-07 NOTE — Assessment & Plan Note (Signed)
Calculated BMI is 34,7

## 2023-02-07 NOTE — Progress Notes (Signed)
Pt arrived from the ED Afib in the monitor, HR ranging from 150-170s. MD informed. Amiodarone ordered but when about to give the bolus, pt converted back to sinus/sinus tachy on monitor. Confirmed on EKG. Held Amiodarone as per MD. Around [id number], pt went back on Afib RVR. Amiodarone given as ordered. Pt sleeping comfortably in the bed without complains.

## 2023-02-07 NOTE — Progress Notes (Signed)
Progress Note   Patient: Renee [name] [id number] DOB: 1966/03/30 DOA: 02/06/2023     1 DOS: the patient was seen and examined on 02/07/2023   Brief hospital course: Renee [name] was admitted to the hospital with the working diagnosis of alcohol withdrawal syndrome.   57 yo female with the past medical history of alcohol dependence, hypertension, atrial fibrillation, and anxiety who presented with the intention to quit alcohol. Positive suicidal ideations. Positive binge drinking for the last 6 months, continuously drinking for the last 2 weeks. 2 bottles of wine the day she came to the ED. On her initial physical examination her blood pressure was 128/84, HR 110, RR 15 and 02 saturation 93% on room air. Patient was alert and awake, but anxious and distracted, lungs with no wheezing or rales, heart with S1 and S2 present and rhythmic, abdomen with no distention or ascites, no lower extremity edema.     Na 138, K 3,6 CL 106 bicarbonate 13 glucose 111, bun 13 cr 0,81 Anion gap 19  AST 243 ALT 64  Wbc 10.7 hgb 15.6 plt 321  Alcohol level 351 Urine drug screen positive benzodiazepines.   EKG 130 bpm, normal axis, normal intervals, sinus rhythm with no significant ST segment or T wave changes.   Patient was placed on CIWA alcohol withdrawal protocol.  Patient developed atrial fibrillation.  Patient was placed on IV amiodarone.  03/28 patient converted to sinus rhythm, continue to have withdrawal symptoms.   Assessment and Plan: * Alcohol withdrawal (HCC) Acute alcohol withdrawal.  Alcoholic ketoacidosis.   Patient continue to have withdrawal symptoms.  Anion gap has closed.   Plan to continue supportive care with CIWA protocol benzodiazepines.  Hold on haloperidol for now.  Continue Iv fluids with balance electrolyte solutions and dextrose. Add 40 meq Kcl and check Mg.  Continue step down. Thiamine and multivitamins po.   Paroxysmal atrial fibrillation (HCC) Patient has  converted to sinus rhythm. Heart rate 90 to 100 on telemetry personally reviewed.   Plan to discontinue amiodarone Add metoprolol 25 mg po tid for rate control Continue aspirin.  Patient has a Chad2 Vas2 c score of 2, considering severe alcohol abuse and risk for fall, will hold on anticoagulation for now.  Continue telemetry monitoring.   Essential hypertension Systolic blood pressure [id number] to 150 mmHg.  At home patient on clonidine (listed as not taking), with high risk of rebound hypertension.   Will place patient on low dose losartan and continue metoprolol for rate control atrial fibrillation.  Hold on clonidine.   GERD (gastroesophageal reflux disease) Continue with pantoprazole.   Anxiety Continue with hydroxyzine, and paroxetine,    Suicidal ideation - Patient reports suicidal ideation with a knife - So far she is here voluntarily, but if she tries to leave she will likely need to be IVC Consulted psychiatry.  Continue one to one suicidal precautions.   Nicotine dependence due to vaping non-tobacco product - Nicotine patch  Class 1 obesity Calculated BMI is 34,7        Subjective: Patient somnolent this am, continue to have anxiety, this am with no suicidal ideations, but confirms she had suicidal thoughts on admission   Physical Exam: Vitals:   02/07/23 0630 02/07/23 0700 02/07/23 0735 02/07/23 0800  BP:  (!) 144/76  (!) 153/86  Pulse: (!) 101 (!) 103  95  Resp: (!) 23 (!) 21  (!) 22  Temp:   98 F (36.7 C)   TempSrc:   Oral  SpO2: 96% 98%  98%  Weight:      Height:       Neurology somnolent but easy to arouse, answer simple questions and follows simple commands ENT with mild pallor Cardiovascular with S1 and S2 present and rhythmic with no gallops, rubs or murmurs Respiratory with no rales or wheezing Abdomen with no distention  No lower extremity edema  Data Reviewed:    Family Communication: no family at the bedside   Disposition: Status  is: Inpatient Remains inpatient appropriate because: active alcohol withdrawal   Planned Discharge Destination: Home    Author: Tawni Millers, MD 02/07/2023 8:57 AM  For on call review [email or web address].

## 2023-02-07 NOTE — Assessment & Plan Note (Addendum)
Patient has converted to sinus rhythm. Telemetry personally reviewed 60 to 80 bpm, sinus.   Patient has a Chad2 Vas2 c score of 2, considering severe alcohol abuse and risk for fall, will hold on anticoagulation for now.   Plan to continue metoprolol 25 mg po tid Continue antiplatelet therapy with aspirin.

## 2023-02-07 NOTE — Progress Notes (Signed)
  Echocardiogram 2D Echocardiogram has been performed.  Renee [name] 02/07/2023, 10:17 AM

## 2023-02-07 NOTE — Plan of Care (Signed)

## 2023-02-07 NOTE — Hospital Course (Addendum)
Mrs. Lamper was admitted to the hospital with the working diagnosis of alcohol withdrawal syndrome.   57 yo female with the past medical history of alcohol dependence, hypertension, atrial fibrillation, and anxiety who presented with the intention to quit alcohol. Positive suicidal ideations. Positive binge drinking for the last 6 months, continuously drinking for the last 2 weeks. 2 bottles of wine the day she came to the ED. On her initial physical examination her blood pressure was 128/84, HR 110, RR 15 and 02 saturation 93% on room air. Patient was alert and awake, but anxious and distracted, lungs with no wheezing or rales, heart with S1 and S2 present and rhythmic, abdomen with no distention or ascites, no lower extremity edema.     Na 138, K 3,6 CL 106 bicarbonate 13 glucose 111, bun 13 cr 0,81 Anion gap 19  AST 243 ALT 64  Wbc 10.7 hgb 15.6 plt 321  Alcohol level 351 Urine drug screen positive benzodiazepines.   EKG 130 bpm, normal axis, normal intervals, sinus rhythm with no significant ST segment or T wave changes.   Patient was placed on CIWA alcohol withdrawal protocol.  Patient developed atrial fibrillation.  Patient was placed on IV amiodarone.  03/28 patient converted to sinus rhythm, continue to have withdrawal symptoms.  03/29 patient continue to have tremors, high dose benzodiazepines requirements.  03/30 patient responding well to benzodiazepines. Patient will need transfer to behavioral center when medically stable.   02/10/2023  patient responding well to benzodiazepines. Patient will need transfer to behavioral center. Patient is Medically Stable now   ===========================================================   Subjective:   Patient is feeling better, she has been out of bed to chair and ambulated with assistance, her tremors and anxiety are improving, she required high doses of benzodiazepines.

## 2023-02-08 DIAGNOSIS — E669 Obesity, unspecified: Secondary | ICD-10-CM

## 2023-02-08 DIAGNOSIS — I480 Paroxysmal atrial fibrillation: Secondary | ICD-10-CM

## 2023-02-08 DIAGNOSIS — F10932 Alcohol use, unspecified with withdrawal with perceptual disturbance: Secondary | ICD-10-CM

## 2023-02-08 LAB — BASIC METABOLIC PANEL
Anion gap: 9 (ref 5–15)
BUN: 10 mg/dL (ref 6–20)
CO2: 21 mmol/L — ABNORMAL LOW (ref 22–32)
Calcium: 8.5 mg/dL — ABNORMAL LOW (ref 8.9–10.3)
Chloride: 105 mmol/L (ref 98–111)
Creatinine, Ser: 0.71 mg/dL (ref 0.44–1.00)
GFR, Estimated: >60 mL/min (ref >60)
Glucose, Bld: 134 mg/dL — ABNORMAL HIGH (ref 70–99)
Potassium: 4 mmol/L (ref 3.5–5.1)
Sodium: 135 mmol/L (ref 135–145)

## 2023-02-08 LAB — MAGNESIUM: Magnesium: 1.7 mg/dL (ref 1.7–2.4)

## 2023-02-08 MED ORDER — DIAZEPAM 5 MG PO TABS
5.0000 mg | ORAL_TABLET | Freq: Two times a day (BID) | ORAL | Status: DC
  Administered 2023-02-08 (×2): 5 mg via ORAL
  Filled 2023-02-08 (×2): qty 1

## 2023-02-08 MED ORDER — LOSARTAN POTASSIUM 25 MG PO TABS
25.0000 mg | ORAL_TABLET | Freq: Once | ORAL | Status: AC
  Administered 2023-02-08: 25 mg via ORAL
  Filled 2023-02-08: qty 1

## 2023-02-08 MED ORDER — ENOXAPARIN SODIUM 60 MG/0.6ML IJ SOSY
50.0000 mg | PREFILLED_SYRINGE | INTRAMUSCULAR | Status: DC
  Administered 2023-02-08 – 2023-02-11 (×4): 50 mg via SUBCUTANEOUS
  Filled 2023-02-08 (×4): qty 0.6

## 2023-02-08 MED ORDER — LOSARTAN POTASSIUM 50 MG PO TABS
50.0000 mg | ORAL_TABLET | Freq: Every day | ORAL | Status: DC
  Administered 2023-02-09: 50 mg via ORAL
  Filled 2023-02-08: qty 1

## 2023-02-08 MED ORDER — ENOXAPARIN SODIUM 60 MG/0.6ML IJ SOSY: 0.5000 mg/kg | PREFILLED_SYRINGE | INTRAMUSCULAR | Status: DC

## 2023-02-08 NOTE — Plan of Care (Signed)
  Problem: Acute Rehab PT Goals(only PT should resolve) Goal: Patient Will Transfer Sit To/From Stand Outcome: Progressing Flowsheets (Taken 02/08/2023 1414) Patient will transfer sit to/from stand:  with supervision  with min guard assist Goal: Pt Will Transfer Bed To Chair/Chair To Bed Outcome: Progressing Flowsheets (Taken 02/08/2023 1414) Pt will Transfer Bed to Chair/Chair to Bed:  with supervision  min guard assist Goal: Pt Will Ambulate Outcome: Progressing Flowsheets (Taken 02/08/2023 1414) Pt will Ambulate:  100 feet  with supervision  with min guard assist  with least restrictive assistive device Goal: Pt/caregiver will Perform Home Exercise Program Outcome: Progressing Flowsheets (Taken 02/08/2023 1414) Pt/caregiver will Perform Home Exercise Program:  For increased strengthening  For improved balance  Independently  2:14 PM, 02/08/23 Mearl Latin PT, DPT Physical Therapist at [facility]

## 2023-02-08 NOTE — Progress Notes (Signed)
Progress Note   Patient: Renee [name] [id number] DOB: 1965-11-20 DOA: 02/06/2023     2 DOS: the patient was seen and examined on 02/08/2023   Brief hospital course: Mrs. Swilling was admitted to the hospital with the working diagnosis of alcohol withdrawal syndrome.   57 yo female with the past medical history of alcohol dependence, hypertension, atrial fibrillation, and anxiety who presented with the intention to quit alcohol. Positive suicidal ideations. Positive binge drinking for the last 6 months, continuously drinking for the last 2 weeks. 2 bottles of wine the day she came to the ED. On her initial physical examination her blood pressure was 128/84, HR 110, RR 15 and 02 saturation 93% on room air. Patient was alert and awake, but anxious and distracted, lungs with no wheezing or rales, heart with S1 and S2 present and rhythmic, abdomen with no distention or ascites, no lower extremity edema.     Na 138, K 3,6 CL 106 bicarbonate 13 glucose 111, bun 13 cr 0,81 Anion gap 19  AST 243 ALT 64  Wbc 10.7 hgb 15.6 plt 321  Alcohol level 351 Urine drug screen positive benzodiazepines.   EKG 130 bpm, normal axis, normal intervals, sinus rhythm with no significant ST segment or T wave changes.   Patient was placed on CIWA alcohol withdrawal protocol.  Patient developed atrial fibrillation.  Patient was placed on IV amiodarone.  03/28 patient converted to sinus rhythm, continue to have withdrawal symptoms.  03/29 patient continue to have tremors, high dose benzodiazepines requirements.   Assessment and Plan: * Alcohol withdrawal (HCC) Acute alcohol withdrawal.  Alcoholic ketoacidosis (resolved).   Patient with high requirements of benzodiazepines about 30 mg yesterday.  Today she is more awake but continue to have tremors.   Plan to add long acting benzodiazepine.  Continue with lorazepam as needed per CIWA protocol.  Continue multivitamins, including thiamine.  Neuro checks per  unit protocol.  Considering high use of benzodiazepines, will keep under close monitoring in the stepdown unit.   Paroxysmal atrial fibrillation (HCC) Patient has converted to sinus rhythm. Telemetry personally reviewed 60 to 80 bpm, sinus.   Patient has a Chad2 Vas2 c score of 2, considering severe alcohol abuse and risk for fall, will hold on anticoagulation for now.   Plan to continue metoprolol 25 mg po tid Continue antiplatelet therapy with aspirin.   Essential hypertension At home patient on clonidine (listed as not taking), with high risk of rebound hypertension.   Systolic blood pressure [id number] to 145 mmHg  Plan to continue with losartan for blood pressure control.   GERD (gastroesophageal reflux disease) Continue with pantoprazole.   Anxiety Continue with hydroxyzine, and paroxetine,    Suicidal ideation - Patient reports suicidal ideation with a knife - So far she is here voluntarily, but if she tries to leave she will likely need to be IVC Consulted psychiatry.  Continue one to one suicidal precautions.   Nicotine dependence due to vaping non-tobacco product - Nicotine patch  Class 1 obesity Calculated BMI is 34,7        Subjective: Patient is more reactive than yesterday, continue to have tremors, no nausea or vomiting and tolerating po well.   Physical Exam: Vitals:   02/08/23 0500 02/08/23 0514 02/08/23 0600 02/08/23 0808  BP:  (!) 124/90 117/68 (!) 145/88  Pulse:  65 70 70  Resp:  17 18 (!) 21  Temp: 97.6 F (36.4 C)   97.7 F (36.5 C)  TempSrc: Oral  Oral  SpO2:  97% 96% 97%  Weight:      Height:       Neurology awake and alert, she responds to questions and follows command, positive tremors, but not agitation.  ENT with mild pallor with no icterus Cardiovascular with S1 and S2 present and regular with no gallops, rubs or murmurs Respiratory with no rales or wheezing, no rhonchi Abdomen with no distention  No lower extremity edema  Data  Reviewed:    Family Communication: no family at the bedside   Disposition: Status is: Inpatient Remains inpatient appropriate because: active alcohol withdrawal.   Planned Discharge Destination: Home   Author: Tawni Millers, MD 02/08/2023 8:53 AM  For on call review [email or web address].

## 2023-02-08 NOTE — [facility] Counselor (Signed)
Renee [name]., NP patient recommended inpatient treatment once patient is medical cleared.

## 2023-02-08 NOTE — Evaluation (Signed)
Occupational Therapy Evaluation Patient Details Name: Renee [name] MRN: [id number] DOB: [birth year] Today's Date: 02/08/2023   History of Present Illness Renee [name] is a 57 y.o. female with medical history significant of alcohol dependence, nicotine dependence, depression, anxiety, hypertension, history of atrial fibrillation-not medicated, and more presents the ED with a chief complaint of alcohol withdrawal.  Patient reports that she is here because she wants to quit.  She reports that she wants to quit because she likes alcohol less than she likes suicidal ideation.  This is how she phrases it.  Patient reports that she is been having suicidal ideation, with the last thoughts happening this morning.  Patient reports she wants to live.  She reports she wants to live for her daughter whom she adopted when she was a day old.  She reports that she is proud of her daughter and she wants to continue being around for her life.  Patient reports her suicidal ideation involved a knife.  She would not give more details than that.  She has no history of suicide attempts.  She has no history of hospitalization for suicidal ideation.  Patient does have a history of depression and she is on Paxil at home.  Patient reports that she usually drinks a box of wine per day.  Husband clarifies that this is only when she is binging.  She has a binge about every 6 months.  In between binges she drinks 2 glasses of wine per night.  This binge has been going on for couple of weeks already.  Her last drink was this morning on the day of presentation.  She drank 2 bottles of wine today prior to arrival.  Her alcohol level is 351.  Patient reports that she has a headache.  Its on the right side of her head and it starts in the front and radiates backwards.  She cannot answer to characterize the pain.  She reports she had some nausea with dry heaves.  She has been tremulous at home, but had no seizure-like activity.  Patient is  very anxious.  Patient has no other complaints at this time. (per DO)   Clinical Impression   Pt agreeable to OT evaluation. Pt having tremors today and thus decreased coordination. Pt was unsteady in standing needing min A to transfer to chair without AD. RW used for ambulation to door and back with Min A due to unsteady and labored gait. Pt has PRN assist from husband at home. Recommend assist for mobility until pt balance improved. Pt left in the chair with sitter in room. Pt will benefit from continued OT in the hospital and recommended venue below to increase strength, balance, and endurance for safe ADL's.        Recommendations for follow up therapy are one component of a multi-disciplinary discharge planning process, led by the attending physician.  Recommendations may be updated based on patient status, additional functional criteria and insurance authorization.   Assistance Recommended at Discharge Intermittent Supervision/Assistance  Patient can return home with the following A little help with walking and/or transfers;A little help with bathing/dressing/bathroom;Assistance with cooking/housework;Assist for transportation;Help with stairs or ramp for entrance    Functional Status Assessment  Patient has had a recent decline in their functional status and demonstrates the ability to make significant improvements in function in a reasonable and predictable amount of time.  Equipment Recommendations  None recommended by OT           Precautions / Restrictions  Precautions Precautions: Fall Restrictions Weight Bearing Restrictions: No      Mobility Bed Mobility Overal bed mobility: Needs Assistance Bed Mobility: Supine to Sit     Supine to sit: Supervision     General bed mobility comments: Labored movement.    Transfers Overall transfer level: Needs assistance Equipment used: Rolling walker (2 wheels) Transfers: Sit to/from Stand, Bed to chair/wheelchair/BSC Sit  to Stand: Min guard     Step pivot transfers: Min guard, Min assist     General transfer comment: Pt unsteady in standing; hand held assist for stan pvot to chair without RW. Min A needed to manage RW during transfers.      Balance Overall balance assessment: Needs assistance Sitting-balance support: No upper extremity supported, Feet supported Sitting balance-Leahy Scale: Good Sitting balance - Comments: seated at EOB   Standing balance support: Bilateral upper extremity supported, During functional activity Standing balance-Leahy Scale: Fair Standing balance comment: poor to fair with RW                           ADL either performed or assessed with clinical judgement   ADL Overall ADL's : Needs assistance/impaired     Grooming: Standing;Minimal assistance   Upper Body Bathing: Set up;Sitting   Lower Body Bathing: Set up;Sitting/lateral leans       Lower Body Dressing: Set up;Sitting/lateral leans   Toilet Transfer: Min guard;Minimal assistance;Ambulation;Rolling walker (2 wheels);Stand-pivot Armed forces technical officer Details (indicate cue type and reason): Simulated via EOB to chair without RW and ambulation in room with RW. Toileting- Clothing Manipulation and Hygiene: Min guard;Minimal assistance;Sitting/lateral lean       Functional mobility during ADLs: Min guard;Minimal assistance;Rolling walker (2 wheels) General ADL Comments: Pt able to ambulate to just outside the room and back to chair.     Vision Baseline Vision/History: 1 Wears glasses Ability to See in Adequate Light: 0 Adequate Patient Visual Report: No change from baseline Vision Assessment?: No apparent visual deficits                Pertinent Vitals/Pain Pain Assessment Pain Assessment: 0-10 Pain Score: 6  Pain Location: head Pain Descriptors / Indicators: Headache Pain Intervention(s): Limited activity within patient's tolerance, Monitored during session, Repositioned     Hand  Dominance Left   Extremity/Trunk Assessment Upper Extremity Assessment Upper Extremity Assessment: Generalized weakness (Tremors today so some decreased coordination.)   Lower Extremity Assessment Lower Extremity Assessment: Defer to PT evaluation   Cervical / Trunk Assessment Cervical / Trunk Assessment: Normal   Communication Communication Communication: No difficulties   Cognition Arousal/Alertness: Awake/alert Behavior During Therapy: WFL for tasks assessed/performed Overall Cognitive Status: Within Functional Limits for tasks assessed                                                        Home Living Family/patient expects to be discharged to:: Private residence Living Arrangements: Spouse/significant other Available Help at Discharge: Family;Available PRN/intermittently Type of Home: House Home Access: Stairs to enter [employer] of Steps: 2 Entrance Stairs-Rails: Left Home Layout: Two level;Able to live on main level with bedroom/bathroom     Bathroom Shower/Tub: Occupational psychologist: Handicapped height Bathroom Accessibility: Yes How Accessible: Accessible via walker Home Equipment: [place] (2 wheels);BSC/3in1;Shower seat  Prior Functioning/Environment Prior Level of Function : Independent/Modified Independent                        OT Problem List: Decreased activity tolerance;Impaired balance (sitting and/or standing);Decreased coordination      OT Treatment/Interventions: Self-care/ADL training;Therapeutic exercise;Therapeutic activities;Patient/family education;Balance training;DME and/or AE instruction    OT Goals(Current goals can be found in the care plan section) Acute Rehab OT Goals Patient Stated Goal: return home OT Goal Formulation: With patient Time For Goal Achievement: 02/22/23 Potential to Achieve Goals: Good  OT Frequency: Min 1X/week                                    End of Session Equipment Utilized During Treatment: Rolling walker (2 wheels);Gait belt  Activity Tolerance: Patient tolerated treatment well Patient left: in chair;with call bell/phone within reach;with nursing/sitter in room  OT Visit Diagnosis: Unsteadiness on feet (R26.81);Other abnormalities of gait and mobility (R26.89)                Time: [id number] OT Time Calculation (min): 20 min Charges:  OT General Charges $OT Visit: 1 Visit OT Evaluation $OT Eval Low Complexity: 1 Low  Wadie Liew OT, MOT  Larey Seat 02/08/2023, 10:55 AM

## 2023-02-08 NOTE — Plan of Care (Signed)

## 2023-02-08 NOTE — Evaluation (Signed)
Physical Therapy Evaluation Patient Details Name: Renee [name] MRN: [id number] DOB: September 23, 1966 Today's Date: 02/08/2023  History of Present Illness  Renee [name] is a 57 y.o. female with medical history significant of alcohol dependence, nicotine dependence, depression, anxiety, hypertension, history of atrial fibrillation-not medicated, and more presents the ED with a chief complaint of alcohol withdrawal.  Patient reports that she is here because she wants to quit.  She reports that she wants to quit because she likes alcohol less than she likes suicidal ideation.  This is how she phrases it.  Patient reports that she is been having suicidal ideation, with the last thoughts happening this morning.  Patient reports she wants to live.  She reports she wants to live for her daughter whom she adopted when she was a day old.  She reports that she is proud of her daughter and she wants to continue being around for her life.  Patient reports her suicidal ideation involved a knife.  She would not give more details than that.  She has no history of suicide attempts.  She has no history of hospitalization for suicidal ideation.  Patient does have a history of depression and she is on Paxil at home.  Patient reports that she usually drinks a box of wine per day.  Husband clarifies that this is only when she is binging.  She has a binge about every 6 months.  In between binges she drinks 2 glasses of wine per night.  This binge has been going on for couple of weeks already.  Her last drink was this morning on the day of presentation.  She drank 2 bottles of wine today prior to arrival.  Her alcohol level is 351.  Patient reports that she has a headache.  Its on the right side of her head and it starts in the front and radiates backwards.  She cannot answer to characterize the pain.  She reports she had some nausea with dry heaves.  She has been tremulous at home, but had no seizure-like activity.  Patient is very  anxious.  Patient has no other complaints at this time.    Clinical Impression  Patient limited for functional mobility as stated below secondary to tremor, BLE weakness, fatigue and impaired standing balance. Patient seated in chair at beginning of session. Patient requires use of RW to transfer to standing with mild unsteadiness present upon standing. Patient ambulates with slow, labored cadence with RW in room and into hall but is limited by fatigue. Patient's balance and tremor worsen with fatigue and patient requires increased assist to ambulate back to bed. Patient will benefit from continued physical therapy in hospital and recommended venue below to increase strength, balance, endurance for safe ADLs and gait.        Recommendations for follow up therapy are one component of a multi-disciplinary discharge planning process, led by the attending physician.  Recommendations may be updated based on patient status, additional functional criteria and insurance authorization.  Follow Up Recommendations       Assistance Recommended at Discharge Intermittent Supervision/Assistance  Patient can return home with the following  A little help with walking and/or transfers;A little help with bathing/dressing/bathroom;Assistance with cooking/housework;Help with stairs or ramp for entrance    Equipment Recommendations None recommended by PT  Recommendations for Other Services       Functional Status Assessment Patient has had a recent decline in their functional status and demonstrates the ability to make significant improvements in function  in a reasonable and predictable amount of time.     Precautions / Restrictions Precautions Precautions: Fall Restrictions Weight Bearing Restrictions: No      Mobility  Bed Mobility               General bed mobility comments: seated in chair at beginning of session    Transfers Overall transfer level: Needs assistance Equipment used:  Rolling walker (2 wheels) Transfers: Sit to/from Stand, Bed to chair/wheelchair/BSC Sit to Stand: Min guard   Step pivot transfers: Min guard       General transfer comment: transfer to standing with RW, unsteady upon standing    Ambulation/Gait Ambulation/Gait assistance: Min guard, Min assist Gait Distance (Feet): 35 Feet Assistive device: Rolling walker (2 wheels)   Gait velocity: decreased     General Gait Details: gradual increase in BLE tremor and unsteadiness with increased fatigue and ambulation distance  Stairs            Wheelchair Mobility    Modified Rankin (Stroke Patients Only)       Balance Overall balance assessment: Needs assistance Sitting-balance support: No upper extremity supported, Feet supported Sitting balance-Leahy Scale: Good Sitting balance - Comments: seated at EOB   Standing balance support: Bilateral upper extremity supported, During functional activity Standing balance-Leahy Scale: Fair Standing balance comment: poor to fair with RW                             Pertinent Vitals/Pain Pain Assessment Pain Score: 7  Pain Location: head Pain Descriptors / Indicators: Headache Pain Intervention(s): Limited activity within patient's tolerance, Monitored during session, Repositioned    Home Living Family/patient expects to be discharged to:: Private residence Living Arrangements: Spouse/significant other Available Help at Discharge: Family;Available PRN/intermittently Type of Home: House Home Access: Stairs to enter Entrance Stairs-Rails: Left Entrance Stairs-Number of Steps: 2   Home Layout: Two level;Able to live on main level with bedroom/bathroom Home Equipment: Conservation officer, nature (2 wheels);BSC/3in1;Shower seat      Prior Function Prior Level of Function : Independent/Modified Independent                     Hand Dominance   Dominant Hand: Left    Extremity/Trunk Assessment   Upper Extremity  Assessment Upper Extremity Assessment: Defer to OT evaluation    Lower Extremity Assessment Lower Extremity Assessment: Generalized weakness (tremors)    Cervical / Trunk Assessment Cervical / Trunk Assessment: Normal  Communication   Communication: No difficulties  Cognition Arousal/Alertness: Awake/alert Behavior During Therapy: WFL for tasks assessed/performed Overall Cognitive Status: Within Functional Limits for tasks assessed                                          General Comments      Exercises     Assessment/Plan    PT Assessment Patient needs continued PT services  PT Problem List Decreased strength;Decreased activity tolerance;Decreased balance;Decreased mobility;Decreased coordination       PT Treatment Interventions DME instruction;Therapeutic exercise;Gait training;Balance training;Stair training;Neuromuscular re-education;Functional mobility training;Therapeutic activities;Patient/family education    PT Goals (Current goals can be found in the Care Plan section)  Acute Rehab PT Goals Patient Stated Goal: go home PT Goal Formulation: With patient Time For Goal Achievement: 02/22/23 Potential to Achieve Goals: Good    Frequency Min 3X/week  Co-evaluation               AM-PAC PT "6 Clicks" Mobility  Outcome Measure Help needed turning from your back to your side while in a flat bed without using bedrails?: None Help needed moving from lying on your back to sitting on the side of a flat bed without using bedrails?: None Help needed moving to and from a bed to a chair (including a wheelchair)?: A Little Help needed standing up from a chair using your arms (e.g., wheelchair or bedside chair)?: A Little Help needed to walk in hospital room?: A Little Help needed climbing 3-5 steps with a railing? : A Lot 6 Click Score: 19    End of Session   Activity Tolerance: Patient limited by fatigue Patient left: in bed;with  nursing/sitter in room;with call bell/phone within reach Nurse Communication: Mobility status PT Visit Diagnosis: Unsteadiness on feet (R26.81);Other abnormalities of gait and mobility (R26.89);Muscle weakness (generalized) (M62.81);Other symptoms and signs involving the nervous system (R29.898)    Time: [id number] PT Time Calculation (min) (ACUTE ONLY): 13 min   Charges:   PT Evaluation $PT Eval Low Complexity: 1 Low PT Treatments $Therapeutic Activity: 8-22 mins        2:13 PM, 02/08/23 Mearl Latin PT, DPT Physical Therapist at [facility]

## 2023-02-08 NOTE — Plan of Care (Signed)
  Problem: Acute Rehab OT Goals (only OT should resolve) Goal: Pt. Will Perform Grooming Flowsheets (Taken 02/08/2023 1102) Pt Will Perform Grooming:  Independently  standing Goal: Pt. Will Perform Lower Body Bathing Flowsheets (Taken 02/08/2023 1102) Pt Will Perform Lower Body Bathing:  Independently  sitting/lateral leans Goal: Pt. Will Perform Lower Body Dressing Flowsheets (Taken 02/08/2023 1102) Pt Will Perform Lower Body Dressing:  Independently  sitting/lateral leans Goal: Pt. Will Perform Toileting-Clothing Manipulation Flowsheets (Taken 02/08/2023 1102) Pt Will Perform Toileting - Clothing Manipulation and hygiene:  Independently  sitting/lateral leans  Kodie Pick OT, MOT
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
## 2023-02-09 MED ORDER — METOCLOPRAMIDE HCL 5 MG/ML IJ SOLN
5.0000 mg | Freq: Four times a day (QID) | INTRAMUSCULAR | Status: DC | PRN
  Administered 2023-02-09: 5 mg via INTRAVENOUS
  Filled 2023-02-09: qty 2

## 2023-02-09 MED ORDER — DIAZEPAM 5 MG PO TABS
10.0000 mg | ORAL_TABLET | Freq: Two times a day (BID) | ORAL | Status: DC
  Administered 2023-02-09 – 2023-02-10 (×3): 10 mg via ORAL
  Filled 2023-02-09 (×3): qty 2

## 2023-02-09 MED ORDER — MAGNESIUM SULFATE 2 GM/50ML IV SOLN
2.0000 g | Freq: Once | INTRAVENOUS | Status: AC
  Administered 2023-02-09: 2 g via INTRAVENOUS
  Filled 2023-02-09: qty 50

## 2023-02-09 NOTE — Progress Notes (Signed)
Inpatient Behavioral Health Placement  Pt meets inpatient criteria per Darrol Angel, NP. There are no available beds within the [facility] system per [facility] AC Oris Drone, RN. Referral was sent to the following facilities;   Destination  Service Provider Address Phone Fax  [facility]  [place], [place] [address] [phone]  [facility]  [place], [place] [address] [phone]  [facility]  [address] [place] [address] [place]  [facility]  [address] [place] [address] [phone]  [facility]  [place], [place] [address] [phone] [facility]  [address] [place], [place] [address] [phone] [facility]  [place] [place] [address] [place]  [facility]  [address] [place] [address] [phone]  [facility]  [address] [place] [address] [phone]  [place] [address], [place] [address] [id number]  [facility]  [address] [place] [address] [phone] [place]  [address] [place] [address] [phone] [facility]  [address], [place] [address] [phone]  [facility]  [address] [place] [address] [phone] [facility]  [address], [place] [address] [phone] [facility]  [place] [address] [phone]  [place], [place] [address] [phone] [facility]  [address] [place] [address] [phone]  [facility]  [address] [place] [address] [place]  [facility]  [address] [place] [address] [place]  [facility]  [address] [place] [address] [phone] [place], [place] [phone]    Situation ongoing,  [place] will follow up.   Benjaman Kindler, MSW, [facility] 02/09/2023  @ 3:45 PM

## 2023-02-09 NOTE — Plan of Care (Signed)

## 2023-02-09 NOTE — Progress Notes (Addendum)
Progress Note   Patient: Renee [name] [id number] DOB: 01/31/1966 DOA: 02/06/2023     3 DOS: the patient was seen and examined on 02/09/2023   Brief hospital course: Mrs. Renee [name] was admitted to the hospital with the working diagnosis of alcohol withdrawal syndrome.   57 yo female with the past medical history of alcohol dependence, hypertension, atrial fibrillation, and anxiety who presented with the intention to quit alcohol. Positive suicidal ideations. Positive binge drinking for the last 6 months, continuously drinking for the last 2 weeks. 2 bottles of wine the day she came to the ED. On her initial physical examination her blood pressure was 128/84, HR 110, RR 15 and 02 saturation 93% on room air. Patient was alert and awake, but anxious and distracted, lungs with no wheezing or rales, heart with S1 and S2 present and rhythmic, abdomen with no distention or ascites, no lower extremity edema.     Na 138, K 3,6 CL 106 bicarbonate 13 glucose 111, bun 13 cr 0,81 Anion gap 19  AST 243 ALT 64  Wbc 10.7 hgb 15.6 plt 321  Alcohol level 351 Urine drug screen positive benzodiazepines.   EKG 130 bpm, normal axis, normal intervals, sinus rhythm with no significant ST segment or T wave changes.   Patient was placed on CIWA alcohol withdrawal protocol.  Patient developed atrial fibrillation.  Patient was placed on IV amiodarone.  03/28 patient converted to sinus rhythm, continue to have withdrawal symptoms.  03/29 patient continue to have tremors, high dose benzodiazepines requirements.  03/30 patient responding well to benzodiazepines. Patient will need transfer to behavioral center when medically stable.   Assessment and Plan: * Alcohol withdrawal (HCC) Acute alcohol withdrawal.  Alcoholic ketoacidosis (resolved).   Lorazepam requirements have decreased to about 50% from yesterday, down to about 14 mg yesterday.  Tolerated well diazepam 5 mg bid.  This am continue to have tremors, on  ambulation still having difficulty.   Plan to increase diazepam to 10 mg po bid.  Continue with lorazepam as needed per CIWA protocol.  Continue multivitamins, including thiamine.  Mg is 1,7, add 2 g mag sulfate to prevent hyomagnesemia, keep Mg at 2 and K at 4.  Neuro checks per unit protocol.  Considering high use of benzodiazepines, will keep under close monitoring in the stepdown unit.   Paroxysmal atrial fibrillation (HCC) Patient has converted to sinus rhythm. Telemetry personally reviewed 60 to 70 bpm, sinus.   Patient has a Chad2 Vas2 c score of 2, considering severe alcohol abuse and risk for fall, will hold on anticoagulation for now.   Plan to continue metoprolol 25 mg po tid, transition to long acting agent in the next 24 to 48 hrs. Active withdrawal, playing a role in tachycardia.  Continue antiplatelet therapy with aspirin.   Essential hypertension At home patient on clonidine (listed as not taking), with high risk of rebound hypertension.   Uncontrolled hypertension, peak 187/97 mmHg yesterday.  Increased dose of losartan to 50 mg.  Continue blood pressure monitoring.   GERD (gastroesophageal reflux disease) Continue with pantoprazole.   Anxiety Continue with hydroxyzine, and paroxetine,    Suicidal ideation - Patient reports suicidal ideation with a knife - So far she is here voluntarily, but if she tries to leave she will likely need to be IVC Consulted psychiatry, will need admission to behavioral health when medically stable.  Continue one to one suicidal precautions.   Nicotine dependence due to vaping non-tobacco product - Nicotine patch  Class 1 obesity Calculated BMI is 34,7   Subjective: Patient is feeling better, she has been out of bed to chair and ambulated with assistance, her tremors and anxiety are improving, she required high doses of benzodiazepines.   Physical Exam: Vitals:   02/09/23 0400 02/09/23 0500 02/09/23 0600 02/09/23 0809   BP: (!) 149/87 136/76 136/81   Pulse:      Resp: 19 20 11    Temp:  (!) 97.5 F (36.4 C)  98 F (36.7 C)  TempSrc:  Axillary  Oral  SpO2: 98%     Weight:  110.6 kg    Height:       Neurology awake and alert, follows commands and responds to questions, she continue to have tremors in her hands, but no agitation  ENT with mild pallor Cardiovascular with S1 and S2 present and regular with no gallops, rubs or murmurs No JVD No lower extremity edema Respiratory with no rales or wheezing, no rhonchi Abdomen with no distention  Data Reviewed:    Family Communication: no family at the bedside. I spoke with patient's husband over the phone we talked in detail about patient's condition, plan of care and prognosis and all questions were addressed.   Disposition: Status is: Inpatient Remains inpatient appropriate because: neuro checks and alcohol withdrawal treatment.,   Planned Discharge Destination:  behavioral health      Author: Tawni Millers, MD 02/09/2023 9:06 AM  For on call review [email or web address].

## 2023-02-09 NOTE — Progress Notes (Signed)
This CSW requested Night [facility] AC [place], RN to review for [place]. CSW will follow up during 1st shift.  Benjaman Kindler, MSW, LCSWA 02/09/2023 1:26 AM

## 2023-02-10 LAB — BASIC METABOLIC PANEL
Anion gap: 9 (ref 5–15)
BUN: 7 mg/dL (ref 6–20)
CO2: 25 mmol/L (ref 22–32)
Calcium: 8.3 mg/dL — ABNORMAL LOW (ref 8.9–10.3)
Chloride: 105 mmol/L (ref 98–111)
Creatinine, Ser: 0.73 mg/dL (ref 0.44–1.00)
GFR, Estimated: >60 mL/min (ref >60)
Glucose, Bld: 97 mg/dL (ref 70–99)
Potassium: 3.3 mmol/L — ABNORMAL LOW (ref 3.5–5.1)
Sodium: 139 mmol/L (ref 135–145)

## 2023-02-10 LAB — MAGNESIUM: Magnesium: 1.8 mg/dL (ref 1.7–2.4)

## 2023-02-10 MED ORDER — PHENOL 1.4 % MT LIQD: 1.0000 | OROMUCOSAL | Status: DC | PRN

## 2023-02-10 MED ORDER — DIAZEPAM 5 MG PO TABS
5.0000 mg | ORAL_TABLET | Freq: Two times a day (BID) | ORAL | Status: DC
  Administered 2023-02-10 – 2023-02-11 (×2): 5 mg via ORAL
  Filled 2023-02-10 (×2): qty 1

## 2023-02-10 MED ORDER — AMPHETAMINE-DEXTROAMPHETAMINE 10 MG PO TABS
10.0000 mg | ORAL_TABLET | Freq: Two times a day (BID) | ORAL | Status: DC
  Administered 2023-02-10 – 2023-02-11 (×3): 10 mg via ORAL
  Filled 2023-02-10 (×3): qty 1

## 2023-02-10 MED ORDER — LOSARTAN POTASSIUM 25 MG PO TABS: 25.0000 mg | ORAL_TABLET | Freq: Every day | ORAL | Status: DC

## 2023-02-10 MED ORDER — METOPROLOL SUCCINATE ER 50 MG PO TB24
100.0000 mg | ORAL_TABLET | Freq: Every day | ORAL | Status: DC
  Administered 2023-02-10 – 2023-02-11 (×2): 100 mg via ORAL
  Filled 2023-02-10 (×2): qty 2

## 2023-02-10 MED ORDER — POTASSIUM CHLORIDE CRYS ER 20 MEQ PO TBCR
40.0000 meq | EXTENDED_RELEASE_TABLET | Freq: Once | ORAL | Status: AC
  Administered 2023-02-10: 40 meq via ORAL
  Filled 2023-02-10: qty 2

## 2023-02-10 MED ORDER — METOPROLOL SUCCINATE ER 50 MG PO TB24: 50.0000 mg | ORAL_TABLET | Freq: Every day | ORAL | Status: DC

## 2023-02-10 NOTE — Progress Notes (Signed)
Pt awake and states pt needs to use the bathroom. Upon placing the [facility] pt asked if she could try to walk to the toilet in room, this tech agreed and pt was able to walk toilet with no complications. Unsteady but manageable with a 1X assist. Pt then received bath and full linen change, shampoo cap offered and decided that we will do that after the pt rests for a little while. Pt back to bed with assist and resting at this time.

## 2023-02-10 NOTE — Progress Notes (Signed)
PROGRESS NOTE    Patient: Renee [name]                            PCP: Ludwig Clarks, FNP                    DOB: 1966-08-26            DOA: 02/06/2023 [id number]             DOS: 02/10/2023, 9:44 AM   LOS: 4 days   Date of Service: The patient was seen and examined on 02/10/2023  Subjective:   The patient was seen and examined this morning. Hemodynamically stable. No issues overnight. Sitter at bedside.  Brief Narrative:   Renee [name] was admitted to the hospital with the working diagnosis of alcohol withdrawal syndrome.   57 yo female with the past medical history of alcohol dependence, hypertension, atrial fibrillation, and anxiety who presented with the intention to quit alcohol. Positive suicidal ideations. Positive binge drinking for the last 6 months, continuously drinking for the last 2 weeks. 2 bottles of wine the day she came to the ED. On her initial physical examination her blood pressure was 128/84, HR 110, RR 15 and 02 saturation 93% on room air. Patient was alert and awake, but anxious and distracted, lungs with no wheezing or rales, heart with S1 and S2 present and rhythmic, abdomen with no distention or ascites, no lower extremity edema.     Na 138, K 3,6 CL 106 bicarbonate 13 glucose 111, bun 13 cr 0,81 Anion gap 19  AST 243 ALT 64  Wbc 10.7 hgb 15.6 plt 321  Alcohol level 351 Urine drug screen positive benzodiazepines.   EKG 130 bpm, normal axis, normal intervals, sinus rhythm with no significant ST segment or T wave changes.   Patient was placed on CIWA alcohol withdrawal protocol.  Patient developed atrial fibrillation.  Patient was placed on IV amiodarone.  03/28 patient converted to sinus rhythm, continue to have withdrawal symptoms.  03/29 patient continue to have tremors, high dose benzodiazepines requirements.  03/30 patient responding well to benzodiazepines. Patient will need transfer to behavioral center when medically stable.    02/10/2023  patient responding well to benzodiazepines. Patient will need transfer to behavioral center. Patient is Medically Stable now   ===========================================================   Subjective:   Patient is feeling better, she has been out of bed to chair and ambulated with assistance, her tremors and anxiety are improving, she required high doses of benzodiazepines.    Assessment & Plan:   Principal Problem:   Alcohol withdrawal (HCC) Active Problems:   Paroxysmal atrial fibrillation (HCC)   Essential hypertension   GERD (gastroesophageal reflux disease)   Anxiety   Suicidal ideation   Nicotine dependence due to vaping non-tobacco product   Class 1 obesity     Assessment and Plan: * Alcohol withdrawal (HCC) Acute alcohol withdrawal.  Tremors, no other signs of DTs at this point. Alcoholic ketoacidosis (resolved).   IV Lorazepam requirements have decreased --Tolerated well diazepam 5 mg bid.  This am continue to have tremors, on ambulation still having difficulty.   Plan to increase diazepam to 10 mg po bid.  Continue with lorazepam as needed per CIWA protocol.  Continue multivitamins, including thiamine   Monitor and replete electrolytes  Neuro checks per unit protocol.  Considering high use of benzodiazepines, will keep under close monitoring in  the stepdown unit.   Paroxysmal atrial fibrillation (HCC) Patient has converted to sinus rhythm. Telemetry personally reviewed 60 to 70 bpm, sinus.   Patient has a Chad2 Vas2 c score of 2, considering severe alcohol abuse and risk for fall, will hold on anticoagulation for now.   Plan to continue Bb, changed to Toprol XL 100 mg m Active withdrawal, playing a role in tachycardia.  Continue antiplatelet therapy with aspirin.   Essential hypertension With dialysis borderline hypotensive, Discontinuing clonidine, she just was not listed on med list with hospice services -Discontinuing losartan,  increasing her Toprol Xl at 100 mg  - PRN Hydralazin  Continue blood pressure monitoring.   GERD (gastroesophageal reflux disease) Continue with pantoprazole.   Anxiety Continue with hydroxyzine, and paroxetine,    Suicidal ideation - Patient reports suicidal ideation with a knife - So far she is here voluntarily, but if she tries to leave she will likely need to be IVC Consulted psychiatry,  -Patient is deemed medically stable to be admitted to  behavioral health  Continue one to one suicidal precautions.   Nicotine dependence due to vaping non-tobacco product - Nicotine patch  Class 1 obesity Calculated BMI is 34,7   ----------------------------------------------------------------------------------------------------------------------  DVT prophylaxis:  SCDs Start: 02/06/23 2041   Code Status:   Code Status: Full Code  Family Communication: No family member present at bedside- attempt will be made to update daily The above findings and plan of care has been discussed with patient (and family)  in detail,  they expressed understanding and agreement of above. -Advance care planning has been discussed.   Admission status:   Status is: Inpatient Remains inpatient appropriate because: Needing treatment for alcohol withdrawal, psych, suicidal evaluation-needing inpatient psych admission   Disposition: Medically stable ready to be admitted to psych yesterday-also reviewed health for suicidal ideation.  Procedures:   No admission procedures for hospital encounter.   Antimicrobials:  Anti-infectives (From admission, onward)    None        Medication:   amphetamine-dextroamphetamine  15 mg Oral BID WC   aspirin  81 mg Oral Daily   Chlorhexidine Gluconate Cloth  6 each Topical Q0600   diazepam  10 mg Oral BID   enoxaparin (LOVENOX) injection  50 mg Subcutaneous [id number]   folic acid  1 mg Oral Daily   LORazepam  0-4 mg Intravenous Q12H   metoprolol succinate  100  mg Oral Daily   multivitamin with minerals  1 tablet Oral Daily   nicotine  21 mg Transdermal Daily   pantoprazole  40 mg Oral Daily   PARoxetine  40 mg Oral BH-q7a   rosuvastatin  5 mg Oral Daily   thiamine  100 mg Oral Daily   Or   thiamine  100 mg Intravenous Daily    cyclobenzaprine, hydrOXYzine, ibuprofen, metoCLOPramide (REGLAN) injection, mouth rinse, oxyCODONE, phenol, traZODone   Objective:   Vitals:   02/10/23 0323 02/10/23 0408 02/10/23 0516 02/10/23 0541  BP: 116/74  109/75   Pulse:      Resp: 18  18   Temp: (!) 97.5 F (36.4 C)     TempSrc: Axillary     SpO2: 98%  92% 90%  Weight:  110.7 kg    Height:        Intake/Output Summary (Last 24 hours) at 02/10/2023 0944 Last data filed at 02/10/2023 0939 Gross per 24 hour  Intake 1768.87 ml  Output 2326 ml  Net -557.13 ml   [employer]  02/06/23 2213 02/09/23 0500 02/10/23 0408  Weight: 106.8 kg 110.6 kg 110.7 kg     Physical examination:   Constitution:  Alert, cooperative, no distress,  Appears calm and comfortable  Psychiatric:   Normal and stable mood and affect, cognition intact,   Stable but reporting suicidal ideation, no homicidal ideation   HEENT:        Normocephalic, PERRL, otherwise with in Normal limits  Chest:         Chest symmetric Cardio vascular:  S1/S2, RRR, No murmure, No Rubs or Gallops  pulmonary: Clear to auscultation bilaterally, respirations unlabored, negative wheezes / crackles Abdomen: Soft, non-tender, non-distended, bowel sounds,no masses, no organomegaly Muscular skeletal: Limited exam - in bed, able to move all 4 extremities,   Neuro: CNII-XII intact. , normal motor and sensation, reflexes intact  Extremities: No pitting edema lower extremities, +2 pulses  Skin: Dry, warm to touch, negative for any Rashes, No open wounds Wounds: per nursing  documentation   ------------------------------------------------------------------------------------------------------------------------------------------    LABs:     Latest Ref Rng & Units 02/07/2023    4:24 AM 02/06/2023    5:42 PM 11/24/2022    7:12 PM  CBC  WBC 4.0 - 10.5 K/uL 6.3  10.7  7.3   Hemoglobin 12.0 - 15.0 g/dL 13.3  15.6  12.4   Hematocrit 36.0 - 46.0 % 40.0  45.2  36.7   Platelets 150 - 400 K/uL 199  321  211       Latest Ref Rng & Units 02/10/2023    4:42 AM 02/08/2023    4:18 AM 02/07/2023    4:24 AM  CMP  Glucose 70 - 99 mg/dL 97  134  96   BUN 6 - 20 mg/dL 7  10  14    Creatinine 0.44 - 1.00 mg/dL 0.73  0.71  0.70   Sodium 135 - 145 mmol/L 139  135  138   Potassium 3.5 - 5.1 mmol/L 3.3  4.0  3.7   Chloride 98 - 111 mmol/L 105  105  109   CO2 22 - 32 mmol/L 25  21  18    Calcium 8.9 - 10.3 mg/dL 8.3  8.5  8.4   Total Protein 6.5 - 8.1 g/dL   6.2   Total Bilirubin 0.3 - 1.2 mg/dL   1.2   Alkaline Phos 38 - 126 U/L   106   AST 15 - 41 U/L   210   ALT 0 - 44 U/L   57        Micro Results Recent Results (from the past 240 hour(s))  MRSA Next Gen by PCR, Nasal     Status: None   Collection Time: 02/06/23  9:46 PM   Specimen: Nasal Mucosa; Nasal Swab  Result Value Ref Range Status   MRSA by PCR Next Gen NOT DETECTED NOT DETECTED Final    Comment: (NOTE) The GeneXpert MRSA Assay (FDA approved for NASAL specimens only), is one component of a comprehensive MRSA colonization surveillance program. It is not intended to diagnose MRSA infection nor to guide or monitor treatment for MRSA infections. Test performance is not FDA approved in patients less than 67 years old. Performed at [facility], [address] [place], [place] [address]     Radiology Reports No results found.  SIGNED: Deatra James, MD, FHM. FAAFP. Zacarias Pontes - [facility] Time spent 55 min.  Of critical time in seeing, evaluating and examining the patient. Reviewing medical  records, labs,  drawn plan of care. [facility],  Pager (please use [email or web address] to page/ text) Please use Epic Secure Chat for non-urgent communication (7AM-7PM)  If 7PM-7AM, please contact night-coverage [email or web address], 02/10/2023, 9:44 AM

## 2023-02-10 NOTE — Consult Note (Signed)
Telepsych Consultation   Reason for Consult:  Alcohol use disorder with suicidal ideations  Referring Physician:  Gerome Apley Location of Patient: [facility] ICU 04-01 Location of Provider: Other: [facility]    Patient Identification: Renee [name] MRN:  [id number] Principal Diagnosis: Alcohol withdrawal Diagnosis:  Principal Problem:   Alcohol withdrawal ([place]) Active Problems:   Essential hypertension   Class 1 obesity   GERD (gastroesophageal reflux disease)   Alcohol abuse with intoxication ([place])   Anxiety   Suicidal ideation   Nicotine dependence due to vaping non-tobacco product   Paroxysmal atrial fibrillation ([place])   Total Time spent with patient: 45 minutes  Subjective:  "I'm getting better just not sleeping well"   Renee [name], 57 y.o., female patient seen via remotely via secure video by this provider, consulted with Dr. Dwyane Dee; and chart reviewed on 02/10/23.  HPI:   Renee [name] is a 57 y.o. female patient admitted to [facility] ED due to suicidal statements after presenting to the ED requesting Detox for Alcohol Intoxication.  Patient has a mental health history significant for depression and is currently prescribed Paxil. Patient reports engaging in drinking binges when she becomes stressed and will drink box of wine per day when she is going through a phase of binge drinking.  She reports suicidal thoughts on admission were related to intoxication, however she is motivated to seek outpatient alcohol addiction treatment once discharged from the her current inpatient admission. She would like to go to [facility] in [place]. She denies HI/AH/VH.Patient remains medically admitted to the ICU due to ongoing alcohol withdrawal symptoms and other associated medical conditions which require medical management. Patient only concern is that since being in the hospital she is experiencing difficulty sleeping which is not a problem  for her when at home. Patient denies any symptoms of depression, hopelessness, or thoughts of harming others.  During evaluation JACQUESE YARWOOD is laying in bed in no acute distress. She is alert, oriented x 4, calm, cooperative and attentive. Her mood is euthymic with congruent affect.  She has normal speech, and behavior.  Objectively there is no evidence of psychosis/mania or delusional thinking.  Patient is able to converse coherently, goal directed thoughts, no distractibility, or pre-occupation.  She also denies suicidal/self-harm/homicidal ideation, psychosis, and paranoia.  Patient answered question appropriately.  Patient is able to contract for safety and does not meet criteria for inpatient psychiatric treatment.     Past Medical History:  Past Medical History:  Diagnosis Date   Alcohol abuse    Depression    History of total abdominal hysterectomy    Hypertension    PAF (paroxysmal atrial fibrillation) ([place])     Past Surgical History:  Procedure Laterality Date   CHOLECYSTECTOMY     COLONOSCOPY N/A 01/26/2021   Procedure: COLONOSCOPY;  Surgeon: Rogene Houston, MD;  Location: [facility];  Service: Endoscopy;  Laterality: N/A;  AM   COLONOSCOPY N/A 09/14/2021   Procedure: COLONOSCOPY;  Surgeon: Rogene Houston, MD;  Location: [facility];  Service: Endoscopy;  Laterality: N/A;  10:30   POLYPECTOMY  09/14/2021   Procedure: POLYPECTOMY;  Surgeon: Rogene Houston, MD;  Location: [facility];  Service: Endoscopy;;   SMALL INTESTINE SURGERY N/A    TONSILLECTOMY     TOTAL ABDOMINAL HYSTERECTOMY     Family History:  Family History  Problem Relation Age of Onset   Hypertension Father  Social History:  Social History   Substance and Sexual Activity  Alcohol Use Yes     Social History   Substance and Sexual Activity  Drug Use Never    Social History   Socioeconomic History   Marital status: Married    Spouse name: Not on file   Number of children: Not  on file   Years of education: Not on file   Highest education level: Not on file  Occupational History   Not on file  Tobacco Use   Smoking status: Former    Types: Cigarettes    Quit date: 2018    Years since quitting: 6.2   Smokeless tobacco: Never   Tobacco comments:    35 pack-year  Vaping Use   Vaping Use: Every day  Substance and Sexual Activity   Alcohol use: Yes   Drug use: Never   Sexual activity: Yes  Other Topics Concern   Not on file  Social History Narrative   Not on file   Social Determinants of Health   Financial Resource Strain: Not on file  Food Insecurity: No Food Insecurity (02/06/2023)   Hunger Vital Sign    Worried About Running Out of Food in the Last Year: Never true    Ran Out of Food in the Last Year: Never true  Transportation Needs: No Transportation Needs (02/06/2023)   PRAPARE - Hydrologist (Medical): No    Lack of Transportation (Non-Medical): No  Physical Activity: Not on file  Stress: Not on file  Social Connections: Not on file   Additional Social History:    Allergies:   Allergies  Allergen Reactions   Librium [Chlordiazepoxide] Other (See Comments)    Does not like the way it makes her feel    Labs:  Results for orders placed or performed during the hospital encounter of 02/06/23 (from the past 48 hour(s))  Basic metabolic panel     Status: Abnormal   Collection Time: 02/10/23  4:42 AM  Result Value Ref Range   Sodium 139 135 - 145 mmol/L   Potassium 3.3 (L) 3.5 - 5.1 mmol/L   Chloride 105 98 - 111 mmol/L   CO2 25 22 - 32 mmol/L   Glucose, Bld 97 70 - 99 mg/dL    Comment: Glucose reference range applies only to samples taken after fasting for at least 8 hours.   BUN 7 6 - 20 mg/dL   Creatinine, Ser 0.73 0.44 - 1.00 mg/dL   Calcium 8.3 (L) 8.9 - 10.3 mg/dL   GFR, Estimated >60 >60 mL/min    Comment: (NOTE) Calculated using the CKD-EPI Creatinine Equation (2021)    Anion gap 9 5 - 15     Comment: Performed at [facility], [address], [place], [place] [address]  Magnesium     Status: None   Collection Time: 02/10/23  4:42 AM  Result Value Ref Range   Magnesium 1.8 1.7 - 2.4 mg/dL    Comment: Performed at [facility], [address], [place], [place] [address]    Medications:  Current Facility-Administered Medications  Medication Dose Route Frequency Provider Last Rate Last Admin   amphetamine-dextroamphetamine (ADDERALL) tablet 10 mg  10 mg Oral BID WC Shahmehdi, Seyed A, MD       aspirin chewable tablet 81 mg  81 mg Oral Daily Zierle-Ghosh, Asia B, DO   81 mg at 02/09/23 0958   Chlorhexidine Gluconate Cloth 2 % PADS 6 each  6  each Topical Q0600 Zierle-Ghosh, Asia B, DO   6 each at 02/10/23 0419   cyclobenzaprine (FLEXERIL) tablet 10 mg  10 mg Oral BID PRN Zierle-Ghosh, Asia B, DO       diazepam (VALIUM) tablet 10 mg  10 mg Oral BID Arrien, Jimmy Picket, MD   10 mg at 02/09/23 2147   enoxaparin (LOVENOX) injection 50 mg  50 mg Subcutaneous Q24H Arrien, Jimmy Picket, MD   50 mg at [id number]   folic acid (FOLVITE) tablet 1 mg  1 mg Oral Daily Zierle-Ghosh, Asia B, DO   1 mg at 02/09/23 [id number]   hydrOXYzine (VISTARIL) capsule 25 mg  25 mg Oral QHS PRN Zierle-Ghosh, Asia B, DO       ibuprofen (ADVIL) tablet 400 mg  400 mg Oral Q6H PRN Zierle-Ghosh, Asia B, DO       metoCLOPramide (REGLAN) injection 5 mg  5 mg Intravenous Q6H PRN Arrien, Jimmy Picket, MD   5 mg at 02/09/23 1505   metoprolol succinate (TOPROL-XL) 24 hr tablet 100 mg  100 mg Oral Daily Shahmehdi, Seyed A, MD       multivitamin with minerals tablet 1 tablet  1 tablet Oral Daily Zierle-Ghosh, Asia B, DO   1 tablet at 02/09/23 [id number]   nicotine (NICODERM CQ - dosed in mg/24 hours) patch 21 mg  21 mg Transdermal Daily Zierle-Ghosh, Asia B, DO   21 mg at 02/09/23 [id number]   Oral care mouth rinse  15 mL Mouth Rinse PRN Zierle-Ghosh, Asia B, DO       oxyCODONE (Oxy IR/ROXICODONE) immediate release tablet 5 mg  5  mg Oral Q4H PRN Zierle-Ghosh, Asia B, DO   5 mg at 02/09/23 2339   pantoprazole (PROTONIX) EC tablet 40 mg  40 mg Oral Daily Zierle-Ghosh, Asia B, DO   40 mg at 02/09/23 [id number]   PARoxetine (PAXIL) tablet 40 mg  40 mg Oral BH-q7a Zierle-Ghosh, Asia B, DO   40 mg at 02/10/23 [id number]   phenol (CHLORASEPTIC) mouth spray 1 spray  1 spray Mouth/Throat PRN Arrien, Jimmy Picket, MD       rosuvastatin (CRESTOR) tablet 5 mg  5 mg Oral Daily Zierle-Ghosh, Asia B, DO   5 mg at 02/09/23 [id number]   thiamine (VITAMIN B1) tablet 100 mg  100 mg Oral Daily Zierle-Ghosh, Asia B, DO   100 mg at 02/09/23 [id number]   Or   thiamine (VITAMIN B1) injection 100 mg  100 mg Intravenous Daily Zierle-Ghosh, Asia B, DO   100 mg at 02/06/23 1801   traZODone (DESYREL) tablet 100 mg  100 mg Oral QHS PRN Zierle-Ghosh, Asia B, DO   100 mg at 02/09/23 2147   Psychiatric Specialty Exam:  Presentation  General Appearance:  Appropriate for Environment  Eye Contact: Fair  Speech: Clear and Coherent  Speech Volume: Normal  Handedness: Right   Mood and Affect  Mood: Euthymic  Affect: Appropriate   Thought Process  Thought Processes: Coherent  Descriptions of Associations:Intact  Orientation:Full (Time, Place and Person)  Thought Content:Logical  History of Schizophrenia/Schizoaffective disorder:No  Duration of Psychotic Symptoms:No data recorded Hallucinations:Hallucinations: None  Ideas of Reference:None  Suicidal Thoughts:Suicidal Thoughts: No  Homicidal Thoughts:No data recorded  Sensorium  Memory: Immediate Good; Recent Good  Judgment: Fair  Insight: Fair   Community education officer  Concentration: Fair  Attention Span: Fair  Recall: Good  Fund of Knowledge: Good  Language: Good   Psychomotor Activity  Psychomotor Activity: Psychomotor Activity: Normal   Assets  Assets: Armed forces logistics/support/administrative officer; Desire for Improvement; Financial Resources/Insurance; Social Support;  Resilience   Sleep  Sleep: Sleep: Poor (reports difficulty sleeping while in the hospital)    Physical Exam: Speaking in clear sentences. Breathing pattern is audibly normal.  Patient is asking and responding to questions appropriately.  Review of Systems  Psychiatric/Behavioral:  Positive for substance abuse. The patient has insomnia.     Blood pressure 109/75, pulse 70, temperature (!) 97.5 F (36.4 C), temperature source Axillary, resp. rate 18, height 5\' 9"  (QA348G m), weight 110.7 kg, SpO2 90 %. Body mass index is 36.04 kg/m.  Treatment Plan Summary: Patient case review and discussed with Dr. Dwyane Dee, and patient does not meet criteria for inpatient psychiatric treatment. Patient is able to contract for safety, and wishes to receive ongoing substance treatment on an outpatient basis. ED treatment team notified of disposition.   Disposition: No evidence of imminent risk to self or others at present.   Patient does not meet criteria for psychiatric inpatient admission.  This service was provided via telemedicine using a 2-way, interactive audio and video technology.  Names of all persons participating in this telemedicine service and their role in this encounter. Name: Shikita Pickrel PMHNP-BC Role: Nurse Practitioner   Name: [name] Role: Patient  Name: Gillie Manners  Role: Husband       Chrystal, Burgard, PMHNP-BC  [facility]  [id number]  02/10/2023 10:29 AM

## 2023-02-11 MED ORDER — ACAMPROSATE CALCIUM 333 MG PO TBEC
666.0000 mg | DELAYED_RELEASE_TABLET | Freq: Three times a day (TID) | ORAL | Status: DC
  Administered 2023-02-11: 666 mg via ORAL
  Filled 2023-02-11 (×7): qty 2

## 2023-02-11 MED ORDER — NICOTINE 21 MG/24HR TD PT24: 21.0000 mg | MEDICATED_PATCH | Freq: Every day | TRANSDERMAL | 0 refills | Status: DC

## 2023-02-11 MED ORDER — METOPROLOL SUCCINATE ER 100 MG PO TB24: 100.0000 mg | ORAL_TABLET | Freq: Every day | ORAL | 1 refills | Status: AC

## 2023-02-11 MED ORDER — FOLIC ACID 1 MG PO TABS: 1.0000 mg | ORAL_TABLET | Freq: Every day | ORAL | 0 refills | Status: AC

## 2023-02-11 MED ORDER — HYDROXYZINE PAMOATE 25 MG PO CAPS: 25.0000 mg | ORAL_CAPSULE | Freq: Every evening | ORAL | 0 refills | Status: DC | PRN

## 2023-02-11 MED ORDER — ACAMPROSATE CALCIUM 333 MG PO TBEC: 666.0000 mg | DELAYED_RELEASE_TABLET | Freq: Three times a day (TID) | ORAL | 0 refills | Status: AC

## 2023-02-11 MED ORDER — ARIPIPRAZOLE 10 MG PO TABS: 10.0000 mg | ORAL_TABLET | Freq: Every day | ORAL | 1 refills | Status: DC

## 2023-02-11 MED ORDER — THIAMINE HCL 100 MG/ML IJ SOLN: 100.0000 mg | Freq: Every day | INTRAMUSCULAR | 0 refills | Status: AC

## 2023-02-11 MED ORDER — DIAZEPAM 5 MG PO TABS: 5.0000 mg | ORAL_TABLET | Freq: Two times a day (BID) | ORAL | 0 refills | Status: AC

## 2023-02-11 MED ORDER — ROSUVASTATIN CALCIUM 5 MG PO TABS: 5.0000 mg | ORAL_TABLET | Freq: Every day | ORAL | 3 refills | Status: AC

## 2023-02-11 NOTE — TOC Transition Note (Signed)
Transition of Care [facility]) - CM/SW Discharge Note   Patient Details  Name: Renee [name] MRN: [id number] Date of Birth: 03-09-1966  [facility]) CM/SW Contact:  Salome Arnt, LCSW Phone Number: 02/11/2023, 11:50 AM   Clinical Narrative:  Pt d/c today. Pt psych cleared. Pt's husband reports she will follow up with [facility]. Discussed recommendation for PT follow up. Home health recommended, but pt does not have insurance. Offered outpatient PT/OT referral. Husband is agreeable to send referral and they will ask when appointment scheduled how much visit will be as pt has no insurance. Referral made. Requested Ingrid screen for [employer].     Final next level of care: OP Rehab Barriers to Discharge: Barriers Resolved   Patient Goals and CMS Choice   Choice offered to / list presented to : Spouse  Discharge Placement                    Name of family member notified: husband Patient and family notified of of transfer: 02/11/23  Discharge Plan and Services Additional resources added to the After Visit Summary for                                       Social Determinants of Health (SDOH) Interventions SDOH Screenings   Food Insecurity: No Food Insecurity (02/06/2023)  Housing: Low Risk  (02/06/2023)  Transportation Needs: No Transportation Needs (02/06/2023)  Utilities: Not At Risk (02/06/2023)  Depression (PHQ2-9): Medium Risk (09/26/2021)  Tobacco Use: Medium Risk (02/06/2023)     Readmission Risk Interventions    08/15/2021   10:34 AM  Readmission Risk Prevention Plan  Medication Screening Complete  Transportation Screening Complete

## 2023-02-11 NOTE — Discharge Summary (Signed)
Physician Discharge Summary   Patient: Renee [name] MRN: [id number] DOB: 1965/11/23  Admit date:     02/06/2023  Discharge date: 02/11/23  Discharge Physician: Deatra James   PCP: Ludwig Clarks, FNP   Recommendations at discharge:  - Follow-up PCP in 1-2 weeks - Follow-up with outpatient psychiatrist 1-2 weeks - Abstain from any alcohol-alcohol products - Do not drive operate heavy machinery on Valium, if too drowsy may discontinue the medication  Discharge Diagnoses: Principal Problem:   Alcohol withdrawal Active Problems:   Paroxysmal atrial fibrillation   Essential hypertension   GERD (gastroesophageal reflux disease)   Anxiety   Suicidal ideation   Nicotine dependence due to vaping non-tobacco product   Class 1 obesity   Alcohol abuse with intoxication  Resolved Problems:   * No resolved hospital problems. [facility] Course: Renee [name] was admitted to the hospital with the working diagnosis of alcohol withdrawal syndrome.   57 yo female with the past medical history of alcohol dependence, hypertension, atrial fibrillation, and anxiety who presented with the intention to quit alcohol. Positive suicidal ideations. Positive binge drinking for the last 6 months, continuously drinking for the last 2 weeks. 2 bottles of wine the day she came to the ED. On her initial physical examination her blood pressure was 128/84, HR 110, RR 15 and 02 saturation 93% on room air. Patient was alert and awake, but anxious and distracted, lungs with no wheezing or rales, heart with S1 and S2 present and rhythmic, abdomen with no distention or ascites, no lower extremity edema.     Na 138, K 3,6 CL 106 bicarbonate 13 glucose 111, bun 13 cr 0,81 Anion gap 19  AST 243 ALT 64  Wbc 10.7 hgb 15.6 plt 321  Alcohol level 351 Urine drug screen positive benzodiazepines.   EKG 130 bpm, normal axis, normal intervals, sinus rhythm with no significant ST segment or T wave changes.   Patient was  placed on CIWA alcohol withdrawal protocol.  Patient developed atrial fibrillation.  Patient was placed on IV amiodarone.  03/28 patient converted to sinus rhythm, continue to have withdrawal symptoms.  03/29 patient continue to have tremors, high dose benzodiazepines requirements.  03/30 patient responding well to benzodiazepines. Patient will need transfer to behavioral center when medically stable.   02/10/2023  patient responding well to benzodiazepines. Patient will need transfer to behavioral center.   Patient was cleared by psychiatry to be discharged.  ===========================================================    Assessment and Plan: * Alcohol withdrawal Acute alcohol withdrawal.  Tremors, no other signs of DTs at this point. Alcoholic ketoacidosis (resolved).   IV Lorazepam requirements have decreased --Tolerated well diazepam 5 mg bid.  To be continued as needed for 5 more days Continue to have tremors .  Continue multivitamins, including thiamine    Paroxysmal atrial fibrillation Patient has converted to sinus rhythm. Telemetry personally reviewed 60 to 70 bpm, sinus.   Patient has a Chad2 Vas2 c score of 2, considering severe alcohol abuse and risk for fall, will hold on anticoagulation for now.   Plan to continue Bb, changed to Toprol XL 100 mg    Essential hypertension Hypotensive on this admission Discontinuing clonidine, she just was not listed on med list with hospice services -Discontinuing losartan, increasing her Toprol Xl at 100 mg  -.   GERD (gastroesophageal reflux disease) Continue with pantoprazole.   Anxiety Continue with hydroxyzine, and paroxetine,    Suicidal ideation -Psych was consulted, patient was assessed daily,  Currently patient denies any suicidal or homicidal ideation.  In good spirits   Nicotine dependence due to vaping non-tobacco product - Nicotine patch  Class 1 obesity Calculated BMI is 34,7        Pain  control - [employer] Controlled Substance Reporting System database was reviewed. and patient was instructed, not to drive, operate heavy machinery, perform activities at heights, swimming or participation in water activities or provide baby-sitting services while on Pain, Sleep and Anxiety Medications; until their outpatient Physician has advised to do so again. Also recommended to not to take more than prescribed Pain, Sleep and Anxiety Medications.  Consultants: Psychiatry Disposition: Home Diet recommendation:  Discharge Diet Orders (From admission, onward)     Start     Ordered   02/11/23 0000  Diet - low sodium heart healthy        02/11/23 1134           Cardiac diet DISCHARGE MEDICATION: Allergies as of 02/11/2023       Reactions   Librium [chlordiazepoxide] Other (See Comments)   Does not like the way it makes her feel        Medication List     STOP taking these medications    atenolol 50 MG tablet Commonly known as: TENORMIN   chlorthalidone 25 MG tablet Commonly known as: HYGROTON   cloNIDine 0.1 MG tablet Commonly known as: CATAPRES   cloNIDine 0.2 MG tablet Commonly known as: CATAPRES   cyanocobalamin 1000 MCG tablet Commonly known as: VITAMIN B12   cyclobenzaprine 10 MG tablet Commonly known as: FLEXERIL   lisinopril 20 MG tablet Commonly known as: ZESTRIL   lisinopril 40 MG tablet Commonly known as: ZESTRIL   traZODone 100 MG tablet Commonly known as: DESYREL       TAKE these medications    acamprosate 333 MG tablet Commonly known as: CAMPRAL Take 2 tablets (666 mg total) by mouth 3 (three) times daily with meals.   acetaminophen 500 MG tablet Commonly known as: TYLENOL Take 1,000 mg by mouth every 6 (six) hours as needed for mild pain or headache.   amphetamine-dextroamphetamine 30 MG tablet Commonly known as: ADDERALL Take 0.5 tablets by mouth See admin instructions. Take 1/2 tablet by mouth in the morning and 1/2 at  noon What changed: Another medication with the same name was removed. Continue taking this medication, and follow the directions you see here.   ARIPiprazole 10 MG tablet Commonly known as: ABILIFY Take 1 tablet (10 mg total) by mouth daily.   aspirin 81 MG chewable tablet Chew 1 tablet (81 mg total) by mouth daily.   diazepam 5 MG tablet Commonly known as: VALIUM Take 1 tablet (5 mg total) by mouth 2 (two) times daily for 5 days.   esomeprazole 20 MG capsule Commonly known as: NEXIUM Take 20 mg by mouth daily.   folic acid 1 MG tablet Commonly known as: FOLVITE Take 1 tablet (1 mg total) by mouth daily for 5 days. Start taking on: February 12, 2023   hydrOXYzine 25 MG capsule Commonly known as: VISTARIL Take 1 capsule (25 mg total) by mouth at bedtime as needed (for sleep). What changed:  how much to take how to take this when to take this reasons to take this additional instructions   ibuprofen 200 MG tablet Commonly known as: ADVIL Take 400 mg by mouth every 6 (six) hours as needed for moderate pain.   metoprolol succinate 100 MG 24 hr tablet Commonly known  as: TOPROL-XL Take 1 tablet (100 mg total) by mouth daily. Take with or immediately following a meal. Start taking on: February 12, 2023   multivitamin with minerals Tabs tablet Take 1 tablet by mouth daily.   nicotine 21 mg/24hr patch Commonly known as: NICODERM CQ - dosed in mg/24 hours Place 1 patch (21 mg total) onto the skin daily. Start taking on: February 12, 2023   ondansetron 4 MG tablet Commonly known as: ZOFRAN Take 1 tablet every 8 hours by oral route as needed for 10 days.   PARoxetine 40 MG tablet Commonly known as: Paxil Take 1 tablet (40 mg total) by mouth every morning.   rosuvastatin 5 MG tablet Commonly known as: Crestor Take 1 tablet (5 mg total) by mouth daily.   Semaglutide (1 MG/DOSE) 4 MG/3ML Sopn Inject 1 mg as directed once a week.   thiamine 100 MG/ML injection Commonly known as:  VITAMIN B1 Inject 1 mL (100 mg total) into the vein daily for 5 days. Start taking on: February 12, 2023        Follow-up Information     [facility] - Follow up.   Contact information: [email or web address] [address], [place], [place] [address] [place] [facility] [place]. Schedule an appointment as soon as possible for a visit.   Why: mental health medication management and therapy services Contact information: [address] [place], [place] [address] [phone]               Discharge Exam: Filed Weights   02/06/23 2213 02/09/23 0500 02/10/23 0408  Weight: 106.8 kg 110.6 kg 110.7 kg        General:  AAO x 3,  cooperative, no distress;   HEENT:  Normocephalic, PERRL, otherwise with in Normal limits   Neuro:  CNII-XII intact. , normal motor and sensation, reflexes intact   Lungs:   Clear to auscultation BL, Respirations unlabored,  No wheezes / crackles  Cardio:    S1/S2, RRR, No murmure, No Rubs or Gallops   Abdomen:  Soft, non-tender, bowel sounds active all four quadrants, no guarding or peritoneal signs.  Muscular  skeletal:  Limited exam -global generalized weaknesses - in bed, able to move all 4 extremities,   2+ pulses,  symmetric, No pitting edema  Skin:  Dry, warm to touch, negative for any Rashes,  Wounds: Please see nursing documentation          Condition at discharge: good  The results of significant diagnostics from this hospitalization (including imaging, microbiology, ancillary and laboratory) are listed below for reference.   Imaging Studies: ECHOCARDIOGRAM COMPLETE  Result Date: 02/07/2023    ECHOCARDIOGRAM REPORT   Patient Name:   TEYLOR GERTH Date of Exam: 02/07/2023 Medical Rec #:  [id number]       Height:       69.0 in Accession #:    [id number]      Weight:       235.4 lb Date of Birth:  08/10/1966        BSA:          2.214 m Patient  Age:    10 years        BP:           151/102 mmHg Patient Gender: F               HR:  97 bpm. Exam Location:  [facility] Procedure: 2D Echo, Cardiac Doppler, Color Doppler and Intracardiac            Opacification Agent Indications:    Atrial Fibrillation I48.91  History:        Patient has prior history of Echocardiogram examinations, most                 recent 08/13/2021. Alchol withdraw; Risk Factors:Current Smoker                 and Hypertension.  Sonographer:    Greer Pickerel Referring Phys: [id number] ASIA B [place]  Sonographer Comments: No subcostal window and suboptimal parasternal window. Image acquisition challenging due to patient body habitus and Image acquisition challenging due to respiratory motion. IMPRESSIONS  1. Left ventricular ejection fraction, by estimation, is >75%. The left ventricle has hyperdynamic function. The left ventricle has no regional wall motion abnormalities. Left ventricular diastolic parameters were normal.  2. Right ventricular systolic function is normal. The right ventricular size is normal. Tricuspid regurgitation signal is inadequate for assessing PA pressure.  3. The mitral valve is normal in structure. No evidence of mitral valve regurgitation. No evidence of mitral stenosis.  4. The aortic valve was not well visualized. Aortic valve regurgitation is not visualized. No aortic stenosis is present. FINDINGS  Left Ventricle: Left ventricular ejection fraction, by estimation, is >75%. The left ventricle has hyperdynamic function. The left ventricle has no regional wall motion abnormalities. Definity contrast agent was given IV to delineate the left ventricular endocardial borders. The left ventricular internal cavity size was normal in size. There is no left ventricular hypertrophy. Left ventricular diastolic parameters were normal. Right Ventricle: The right ventricular size is normal. Right vetricular wall thickness was not well visualized. Right ventricular  systolic function is normal. Tricuspid regurgitation signal is inadequate for assessing PA pressure. Left Atrium: Left atrial size was normal in size. Right Atrium: Right atrial size was normal in size. Pericardium: There is no evidence of pericardial effusion. Mitral Valve: The mitral valve is normal in structure. No evidence of mitral valve regurgitation. No evidence of mitral valve stenosis. Tricuspid Valve: The tricuspid valve is normal in structure. Tricuspid valve regurgitation is trivial. No evidence of tricuspid stenosis. Aortic Valve: The aortic valve was not well visualized. Aortic valve regurgitation is not visualized. No aortic stenosis is present. Aortic valve mean gradient measures 7.0 mmHg. Aortic valve peak gradient measures 14.4 mmHg. Aortic valve area, by VTI measures 1.83 cm. Pulmonic Valve: The pulmonic valve was not well visualized. Pulmonic valve regurgitation is not visualized. No evidence of pulmonic stenosis. Aorta: The aortic root is normal in size and structure. IAS/Shunts: The interatrial septum was not well visualized.  LEFT VENTRICLE PLAX 2D LVIDd:         4.20 cm   Diastology LVIDs:         2.00 cm   LV e' medial:    8.70 cm/s LV PW:         1.00 cm   LV E/e' medial:  9.2 LV IVS:        0.90 cm   LV e' lateral:   10.60 cm/s LVOT diam:     1.90 cm   LV E/e' lateral: 7.5 LV SV:         59 LV SV Index:   27 LVOT Area:     2.84 cm  RIGHT VENTRICLE RV S prime:     15.00 cm/s TAPSE (M-mode):  1.6 cm LEFT ATRIUM             Index        RIGHT ATRIUM           Index LA diam:        3.80 cm 1.72 cm/m   RA Area:     13.60 cm LA Vol (A2C):   51.1 ml 23.08 ml/m  RA Volume:   33.40 ml  15.08 ml/m LA Vol (A4C):   43.8 ml 19.78 ml/m LA Biplane Vol: 51.2 ml 23.12 ml/m  AORTIC VALVE AV Area (Vmax):    1.98 cm AV Area (Vmean):   1.99 cm AV Area (VTI):     1.83 cm AV Vmax:           190.03 cm/s AV Vmean:          122.896 cm/s AV VTI:            0.321 m AV Peak Grad:      14.4 mmHg AV Mean Grad:       7.0 mmHg LVOT Vmax:         133.00 cm/s LVOT Vmean:        86.300 cm/s LVOT VTI:          0.207 m LVOT/AV VTI ratio: 0.64  AORTA Ao Root diam: 3.90 cm Ao Asc diam:  3.30 cm MITRAL VALVE MV Area (PHT): 4.21 cm    SHUNTS MV Decel Time: 180 msec    Systemic VTI:  0.21 m MR Peak grad: 7.7 mmHg     Systemic Diam: 1.90 cm MR Vmax:      139.00 cm/s MV E velocity: 80.00 cm/s MV A velocity: 70.80 cm/s MV E/A ratio:  1.13 Carlyle Dolly MD Electronically signed by Carlyle Dolly MD Signature Date/Time: 02/07/2023/12:38:06 PM    Final    US Abdomen Limited RUQ (LIVER/GB)  Result Date: 02/07/2023 CLINICAL DATA:  Transaminitis EXAM: ULTRASOUND ABDOMEN LIMITED RIGHT UPPER QUADRANT COMPARISON:  None Available. FINDINGS: Gallbladder: Surgically absent Common bile duct: Diameter: 5 mm Liver: Heterogeneous hepatic parenchyma. No discrete mass lesion or intrahepatic biliary ductal dilatation. Portal vein is patent on color Doppler imaging with normal direction of blood flow towards the liver. Other: None. IMPRESSION: Previous cholecystectomy.  No ductal dilatation. Heterogeneous hepatic parenchyma. Please correlate for any history of chronic liver disease. Additional evaluation as clinically appropriate Electronically Signed   By: Jill Side M.D.   On: 02/07/2023 10:40    Microbiology: Results for orders placed or performed during the hospital encounter of 02/06/23  MRSA Next Gen by PCR, Nasal     Status: None   Collection Time: 02/06/23  9:46 PM   Specimen: Nasal Mucosa; Nasal Swab  Result Value Ref Range Status   MRSA by PCR Next Gen NOT DETECTED NOT DETECTED Final    Comment: (NOTE) The GeneXpert MRSA Assay (FDA approved for NASAL specimens only), is one component of a comprehensive MRSA colonization surveillance program. It is not intended to diagnose MRSA infection nor to guide or monitor treatment for MRSA infections. Test performance is not FDA approved in patients less than 71 years old. Performed  at [facility], [address], [place],  [address]     Labs: CBC: Recent Labs  Lab 02/06/23 1742 02/07/23 0424  WBC 10.7* 6.3  HGB 15.6* 13.3  HCT 45.2 40.0  MCV 102.5* 106.1*  PLT 321 [id number]   Basic Metabolic Panel: Recent Labs  Lab 02/06/23 1742 02/07/23 0424 02/08/23  0418 02/10/23 0442  NA 138 138 135 139  K 3.6 3.7 4.0 3.3*  CL 106 109 105 105  CO2 13* 18* 21* 25  GLUCOSE 111* 96 134* 97  BUN 13 14 10 7   CREATININE 0.81 0.70 0.71 0.73  CALCIUM 8.7* 8.4* 8.5* 8.3*  MG 2.0 1.8 1.7 1.8  PHOS 3.8  --   --   --    Liver Function Tests: Recent Labs  Lab 02/06/23 1742 02/07/23 0424  AST 243* 210*  ALT 64* 57*  ALKPHOS 129* 106  BILITOT 0.9 1.2  PROT 7.5 6.2*  ALBUMIN 4.5 3.6   CBG: No results for input(s): "GLUCAP" in the last 168 hours.  Discharge time spent: greater than 40 minutes.  Signed: Deatra James, MD [facility] 02/11/2023

## 2023-02-11 NOTE — Progress Notes (Signed)
Patient discharged with instructions given on medications and follow up visits, patient and family verbalized understanding. Prescriptions sent to Pharmacy of choice documented on AVS. IV discontinued catheter intact. Accompanied by staff to an awaiting vehicle.

## 2023-02-11 NOTE — Progress Notes (Signed)
Mobility Specialist Progress Note:    02/11/23 1115  Mobility  Activity Ambulated with assistance in hallway  Level of Assistance Contact guard assist, steadying assist  Assistive Device Other (Comment) (HHA, hand rails)  Distance Ambulated (ft) 80 ft  Activity Response Tolerated well  Mobility Referral Yes  $Mobility charge 1 Mobility   Pt agreeable to mobility session. Tolerated well, had substantial tremors throughout ambulation leading to moments of LOB during session. Pt required CGA/ HHA for safety. Returned pt to bed with all needs met, call bell in reach.   Royetta Crochet Mobility Specialist Please contact via Solicitor or  Rehab office at [phone]

## 2024-10-12 ENCOUNTER — Emergency Department (HOSPITAL_COMMUNITY)
Admission: EM | Admit: 2024-10-12 | Discharge: 2024-10-12 | Disposition: A | Discharge status: Home / Self Care | Attending: Emergency Medicine | Admitting: Emergency Medicine

## 2024-10-12 ENCOUNTER — Emergency Department (HOSPITAL_COMMUNITY)

## 2024-10-12 ENCOUNTER — Encounter (HOSPITAL_COMMUNITY): Payer: Self-pay

## 2024-10-12 ENCOUNTER — Other Ambulatory Visit: Payer: Self-pay

## 2024-10-12 DIAGNOSIS — Z7982 Long term (current) use of aspirin: Secondary | ICD-10-CM | POA: Diagnosis not present

## 2024-10-12 DIAGNOSIS — Z794 Long term (current) use of insulin: Secondary | ICD-10-CM | POA: Insufficient documentation

## 2024-10-12 DIAGNOSIS — I2609 Other pulmonary embolism with acute cor pulmonale: Secondary | ICD-10-CM

## 2024-10-12 DIAGNOSIS — I2699 Other pulmonary embolism without acute cor pulmonale: Secondary | ICD-10-CM | POA: Insufficient documentation

## 2024-10-12 DIAGNOSIS — R0602 Shortness of breath: Secondary | ICD-10-CM | POA: Diagnosis present

## 2024-10-12 DIAGNOSIS — R7989 Other specified abnormal findings of blood chemistry: Secondary | ICD-10-CM | POA: Diagnosis not present

## 2024-10-12 LAB — COMPREHENSIVE METABOLIC PANEL WITH GFR
ALT: 22 U/L (ref 0–44)
AST: 46 U/L — ABNORMAL HIGH (ref 15–41)
Albumin: 4.3 g/dL (ref 3.5–5.0)
Alkaline Phosphatase: 90 U/L (ref 38–126)
Anion gap: 16 — ABNORMAL HIGH (ref 5–15)
BUN: 17 mg/dL (ref 6–20)
CO2: 21 mmol/L — ABNORMAL LOW (ref 22–32)
Calcium: 8.9 mg/dL (ref 8.9–10.3)
Chloride: 99 mmol/L (ref 98–111)
Creatinine, Ser: 0.93 mg/dL (ref 0.44–1.00)
GFR, Estimated: >60 mL/min (ref >60)
Glucose, Bld: 123 mg/dL — ABNORMAL HIGH (ref 70–99)
Potassium: 4.1 mmol/L (ref 3.5–5.1)
Sodium: 137 mmol/L (ref 135–145)
Total Bilirubin: 0.4 mg/dL (ref 0.0–1.2)
Total Protein: 6.8 g/dL (ref 6.5–8.1)

## 2024-10-12 LAB — CBC WITH DIFFERENTIAL/PLATELET
Abs Immature Granulocytes: 0.03 K/uL (ref 0.00–0.07)
Basophils Absolute: 0.1 K/uL (ref 0.0–0.1)
Basophils Relative: 1 %
Eosinophils Absolute: 0.2 K/uL (ref 0.0–0.5)
Eosinophils Relative: 3 %
HCT: 38.4 % (ref 36.0–46.0)
Hemoglobin: 12.7 g/dL (ref 12.0–15.0)
Immature Granulocytes: 1 %
Lymphocytes Relative: 34 %
Lymphs Abs: 2.2 K/uL (ref 0.7–4.0)
MCH: 34.8 pg — ABNORMAL HIGH (ref 26.0–34.0)
MCHC: 33.1 g/dL (ref 30.0–36.0)
MCV: 105.2 fL — ABNORMAL HIGH (ref 80.0–100.0)
Monocytes Absolute: 0.6 K/uL (ref 0.1–1.0)
Monocytes Relative: 10 %
Neutro Abs: 3.3 K/uL (ref 1.7–7.7)
Neutrophils Relative %: 51 %
Platelets: 203 K/uL (ref 150–400)
RBC: 3.65 MIL/uL — ABNORMAL LOW (ref 3.87–5.11)
RDW: 12.5 % (ref 11.5–15.5)
WBC: 6.4 K/uL (ref 4.0–10.5)
nRBC: 0 % (ref 0.0–0.2)

## 2024-10-12 LAB — PRO BRAIN NATRIURETIC PEPTIDE: Pro Brain Natriuretic Peptide: 600 pg/mL — ABNORMAL HIGH (ref <300.0)

## 2024-10-12 LAB — TROPONIN T, HIGH SENSITIVITY: Troponin T High Sensitivity: <15 ng/L (ref 0–19)

## 2024-10-12 MED ORDER — RIVAROXABAN 15 MG PO TABS
15.0000 mg | ORAL_TABLET | Freq: Once | ORAL | Status: AC
  Administered 2024-10-12: 15 mg via ORAL
  Filled 2024-10-12: qty 1

## 2024-10-12 MED ORDER — IOHEXOL 350 MG/ML SOLN
75.0000 mL | Freq: Once | INTRAVENOUS | Status: AC | PRN
  Administered 2024-10-12: 75 mL via INTRAVENOUS

## 2024-10-12 MED ORDER — RIVAROXABAN (XARELTO) VTE STARTER PACK (15 & 20 MG): ORAL_TABLET | ORAL | 0 refills | Status: DC

## 2024-10-12 NOTE — ED Provider Notes (Signed)
 [place] EMERGENCY DEPARTMENT AT [facility] Provider Note   CSN: [id number] Arrival date & time: 10/12/24  1615     Patient presents with: abnormal labs   Renee [name] is a 58 y.o. female.   HPI Patient presents with chest pain, shortness of breath. Symptoms present for about 2 weeks, she is currently wearing a cardiac monitor. She has been evaluated by cardiology, primary care, and after labs from primary care today resulted with abnormal D-dimer she was sent here for evaluation. At rest patient has negligible dyspnea, but with activity, she has notable shortness of breath.  No current chest pain.  On she is here with her husband assists with the history.    Prior to Admission medications   Medication Sig Start Date End Date Taking? Authorizing Provider  acetaminophen  (TYLENOL ) 500 MG tablet Take 1,000 mg by mouth every 6 (six) hours as needed for mild pain or headache.    [provider]  amphetamine -dextroamphetamine  (ADDERALL) 30 MG tablet Take 0.5 tablets by mouth See admin instructions. Take 1/2 tablet by mouth in the morning and 1/2 at noon    [provider]  ARIPiprazole  (ABILIFY ) 10 MG tablet Take 1 tablet (10 mg total) by mouth daily. 02/11/23 04/12/23  Willette Adriana LABOR, MD  aspirin  81 MG chewable tablet Chew 1 tablet (81 mg total) by mouth daily. 09/15/21   Rehman, Claudis PENNER, MD  esomeprazole (NEXIUM) 20 MG capsule Take 20 mg by mouth daily.    [provider]  hydrOXYzine  (VISTARIL ) 25 MG capsule Take 1 capsule (25 mg total) by mouth at bedtime as needed (for sleep). 02/11/23   Shahmehdi, Adriana LABOR, MD  ibuprofen  (ADVIL ) 200 MG tablet Take 400 mg by mouth every 6 (six) hours as needed for moderate pain.    [provider]  metoprolol  succinate (TOPROL -XL) 100 MG 24 hr tablet Take 1 tablet (100 mg total) by mouth daily. Take with or immediately following a meal. 02/12/23 04/13/23  Shahmehdi, Adriana LABOR, MD  Multiple Vitamin  (MULTIVITAMIN WITH MINERALS) TABS tablet Take 1 tablet by mouth daily. Patient not taking: Reported on 02/06/2023 08/19/21   Pearlean Manus, MD  nicotine  (NICODERM CQ  - DOSED IN MG/24 HOURS) 21 mg/24hr patch Place 1 patch (21 mg total) onto the skin daily. 02/12/23   Shahmehdi, Adriana LABOR, MD  ondansetron  (ZOFRAN ) 4 MG tablet Take 1 tablet every 8 hours by oral route as needed for 10 days. 05/21/22   [provider]  PARoxetine  (PAXIL ) 40 MG tablet Take 1 tablet (40 mg total) by mouth every morning. 08/23/21   Antonetta Rollene BRAVO, MD  rosuvastatin  (CRESTOR ) 5 MG tablet Take 1 tablet (5 mg total) by mouth daily. 02/11/23   Shahmehdi, Adriana LABOR, MD  Semaglutide , 1 MG/DOSE, 4 MG/3ML SOPN Inject 1 mg as directed once a week. Patient not taking: Reported on 02/06/2023 10/27/21   Tobie Suzzane POUR, MD    Allergies: Librium  [chlordiazepoxide ]    Review of Systems  Updated Vital Signs BP (!) 161/94   Pulse 60   Temp 98.3 F (36.8 C) (Oral)   Resp 20   Ht 1.753 m (5' 9)   Wt 110.7 kg   SpO2 97%   BMI 36.04 kg/m   Physical Exam Vitals and nursing note reviewed.  Constitutional:      General: She is not in acute distress.    Appearance: She is well-developed.  HENT:     Head: Normocephalic and atraumatic.  Eyes:  Conjunctiva/sclera: Conjunctivae normal.  Cardiovascular:     Rate and Rhythm: Normal rate and regular rhythm.  Pulmonary:     Effort: Pulmonary effort is normal. No respiratory distress.     Breath sounds: No stridor.  Abdominal:     General: There is no distension.  Skin:    General: Skin is warm and dry.  Neurological:     Mental Status: She is alert and oriented to person, place, and time.     Cranial Nerves: No cranial nerve deficit.  Psychiatric:        Mood and Affect: Mood normal.     (all labs ordered are listed, but only abnormal results are displayed) Labs Reviewed  COMPREHENSIVE METABOLIC PANEL WITH GFR - Abnormal; Notable for the following components:       Result Value   CO2 21 (*)    Glucose, Bld 123 (*)    AST 46 (*)    Anion gap 16 (*)    All other components within normal limits  CBC WITH DIFFERENTIAL/PLATELET - Abnormal; Notable for the following components:   RBC 3.65 (*)    MCV 105.2 (*)    MCH 34.8 (*)    All other components within normal limits  PRO BRAIN NATRIURETIC PEPTIDE - Abnormal; Notable for the following components:   Pro Brain Natriuretic Peptide 600.0 (*)    All other components within normal limits  TROPONIN T, HIGH SENSITIVITY    EKG: EKG Interpretation Date/Time:  Monday October 12 2024 17:30:52 EST Ventricular Rate:  60 PR Interval:  164 QRS Duration:  100 QT Interval:  466 QTC Calculation: 466 R Axis:   29  Text Interpretation: Sinus rhythm T wave abnormality Confirmed by Garrick Charleston [phone]) on 10/12/2024 5:33:46 PM  Radiology: CT Angio Chest PE W/Cm &/Or Wo Cm Result Date: 10/12/2024 EXAM: CTA CHEST 10/12/2024 08:08:05 PM TECHNIQUE: CTA of the chest was performed without and with the administration of 75 mL of intravenous contrast (iohexol (OMNIPAQUE) 350 MG/ML injection 75 mL IOHEXOL 350 MG/ML SOLN). Multiplanar reformatted images are provided for review. MIP images are provided for review. Automated exposure control, iterative reconstruction, and/or weight based adjustment of the mA/kV was utilized to reduce the radiation dose to as low as reasonably achievable. COMPARISON: X-ray 11/24/2022 and CT 11/18/2020. CLINICAL HISTORY: Pulmonary embolism (PE) suspected, low to intermediate prob, positive D-dimer. Exertional dyspnea FINDINGS: PULMONARY ARTERIES: Pulmonary arteries are adequately opacified for evaluation. Linear filling defect in the right lower lobe pulmonary artery (series 4 image 62) is compatible with chronic thromboembolism. No evidence of acute pulmonary embolism. Main pulmonary artery is normal in caliber. MEDIASTINUM: The heart and pericardium demonstrate no acute abnormality. Coronary  artery and aortic atherosclerotic calcification. There is no acute abnormality of the thoracic aorta. LYMPH NODES: No mediastinal, hilar or axillary lymphadenopathy. LUNGS AND PLEURA: Mild centrilobular emphysema. No focal consolidation or pulmonary edema. No evidence of pleural effusion or pneumothorax. UPPER ABDOMEN: Hepatic steatosis. SOFT TISSUES AND BONES: No acute bone or soft tissue abnormality. IMPRESSION: 1. No evidence of acute pulmonary embolism. 2. Linear filling defect in the right lower lobe pulmonary artery compatible with chronic thromboembolism. Electronically signed by: Norman Gatlin MD 10/12/2024 08:25 PM EST RP Workstation: [id number]     Procedures   Medications Ordered in the ED  Rivaroxaban (XARELTO) tablet 15 mg (has no administration in time range)  iohexol (OMNIPAQUE) 350 MG/ML injection 75 mL (75 mLs Intravenous Contrast Given 10/12/24 1834)  Medical Decision Making Adult female with reported questionable history of A-fib, now presents with dyspnea, chest pain intermittently, positive D-dimer as an outpatient. Differential including atypical ACS versus PE pneumonia other pulmonology etiology. Cardiac 70 sinus normal pulse ox 98% room air normal  Amount and/or Complexity of Data Reviewed Independent Historian: spouse External Data Reviewed: notes. Labs: ordered. Decision-making details documented in ED Course. Radiology: ordered and independent interpretation performed. Decision-making details documented in ED Course. ECG/medicine tests: ordered and independent interpretation performed. Decision-making details documented in ED Course.  Risk Prescription drug management. Decision regarding hospitalization.  Patient notes that she has had prior episodes of A-fib, both attributed to heavy alcohol use. 8:56 PM Patient awake, alert, in no distress.  No increased work of breathing, no hypoxia, no tachypnea, no tachycardia.  After  review the patient CT, discussed it with her, husband. Patient with evidence for chronic thromboembolism, no acute PE.  With no acute heart strain, no elevated troponin, no hemodynamic instability patient will start Xarelto. Patient has a scheduled cardiology follow-up later this week, follow-up with cardiology, primary care.  CRITICAL CARE Performed by: Lamar Salen Total critical care time: 35 minutes Critical care time was exclusive of separately billable procedures and treating other patients. Critical care was necessary to treat or prevent imminent or life-threatening deterioration. Critical care was time spent personally by me on the following activities: development of treatment plan with patient and/or surrogate as well as nursing, discussions with consultants, evaluation of patient's response to treatment, examination of patient, obtaining history from patient or surrogate, ordering and performing treatments and interventions, ordering and review of laboratory studies, ordering and review of radiographic studies, pulse oximetry and re-evaluation of patient's condition.   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

## 2024-10-12 NOTE — ED Notes (Signed)
 Pt reports she is actively wearing a heart monitor due to recent intermittent chest pain.

## 2024-10-12 NOTE — Discharge Instructions (Signed)
 Please be sure to obtain and take all medication as prescribed.  Equally important that you follow-up with your physician. Return here for concerning changes in your condition.

## 2024-10-12 NOTE — ED Triage Notes (Signed)
 Pt arrived via POV c/o abnormal lab results. Pt reports seeing her PCP recently due to exertional dyspnea and reports her doctor called her advising to go to the ER due to having an elevated D-Dimer of 0.78 and concern for possible PE.

## 2024-10-12 NOTE — ED Notes (Signed)
 Discharge instructions reviewed with pt. Pt had no questions. Pt declined wheelchair, pt ambulated out of ED.

## 2024-11-06 DIAGNOSIS — I480 Paroxysmal atrial fibrillation: Secondary | ICD-10-CM | POA: Diagnosis not present

## 2024-11-06 DIAGNOSIS — J101 Influenza due to other identified influenza virus with other respiratory manifestations: Secondary | ICD-10-CM | POA: Diagnosis not present

## 2024-11-06 DIAGNOSIS — E785 Hyperlipidemia, unspecified: Secondary | ICD-10-CM | POA: Insufficient documentation

## 2024-11-06 DIAGNOSIS — E878 Other disorders of electrolyte and fluid balance, not elsewhere classified: Secondary | ICD-10-CM | POA: Diagnosis not present

## 2024-11-06 DIAGNOSIS — J9601 Acute respiratory failure with hypoxia: Secondary | ICD-10-CM | POA: Insufficient documentation

## 2024-11-06 DIAGNOSIS — I10 Essential (primary) hypertension: Secondary | ICD-10-CM | POA: Diagnosis not present

## 2024-11-06 DIAGNOSIS — K219 Gastro-esophageal reflux disease without esophagitis: Secondary | ICD-10-CM | POA: Insufficient documentation

## 2024-11-06 DIAGNOSIS — E66812 Obesity, class 2: Secondary | ICD-10-CM | POA: Insufficient documentation

## 2024-11-06 DIAGNOSIS — R0602 Shortness of breath: Secondary | ICD-10-CM | POA: Diagnosis present

## 2024-11-06 DIAGNOSIS — E871 Hypo-osmolality and hyponatremia: Secondary | ICD-10-CM | POA: Diagnosis not present

## 2024-11-07 ENCOUNTER — Emergency Department (HOSPITAL_COMMUNITY)
Admission: EM | Admit: 2024-11-07 | Discharge: 2024-11-07 | Disposition: A | Discharge status: Home / Self Care | Attending: Emergency Medicine | Admitting: Emergency Medicine

## 2024-11-07 ENCOUNTER — Encounter (HOSPITAL_COMMUNITY): Payer: Self-pay

## 2024-11-07 ENCOUNTER — Emergency Department (HOSPITAL_COMMUNITY)

## 2024-11-07 ENCOUNTER — Other Ambulatory Visit: Payer: Self-pay

## 2024-11-07 DIAGNOSIS — I480 Paroxysmal atrial fibrillation: Secondary | ICD-10-CM | POA: Diagnosis present

## 2024-11-07 DIAGNOSIS — I10 Essential (primary) hypertension: Secondary | ICD-10-CM | POA: Diagnosis present

## 2024-11-07 DIAGNOSIS — F1020 Alcohol dependence, uncomplicated: Secondary | ICD-10-CM | POA: Diagnosis present

## 2024-11-07 DIAGNOSIS — J9601 Acute respiratory failure with hypoxia: Secondary | ICD-10-CM

## 2024-11-07 DIAGNOSIS — R7989 Other specified abnormal findings of blood chemistry: Secondary | ICD-10-CM

## 2024-11-07 DIAGNOSIS — J101 Influenza due to other identified influenza virus with other respiratory manifestations: Principal | ICD-10-CM | POA: Diagnosis present

## 2024-11-07 DIAGNOSIS — J40 Bronchitis, not specified as acute or chronic: Secondary | ICD-10-CM

## 2024-11-07 LAB — COMPREHENSIVE METABOLIC PANEL WITH GFR
ALT: 26 U/L (ref 0–44)
AST: 60 U/L — ABNORMAL HIGH (ref 15–41)
Albumin: 4.1 g/dL (ref 3.5–5.0)
Alkaline Phosphatase: 80 U/L (ref 38–126)
Anion gap: 11 (ref 5–15)
BUN: 8 mg/dL (ref 6–20)
CO2: 26 mmol/L (ref 22–32)
Calcium: 8.7 mg/dL — ABNORMAL LOW (ref 8.9–10.3)
Chloride: 94 mmol/L — ABNORMAL LOW (ref 98–111)
Creatinine, Ser: 0.76 mg/dL (ref 0.44–1.00)
GFR, Estimated: >60 mL/min
Glucose, Bld: 114 mg/dL — ABNORMAL HIGH (ref 70–99)
Potassium: 4.2 mmol/L (ref 3.5–5.1)
Sodium: 130 mmol/L — ABNORMAL LOW (ref 135–145)
Total Bilirubin: 0.4 mg/dL (ref 0.0–1.2)
Total Protein: 6.9 g/dL (ref 6.5–8.1)

## 2024-11-07 LAB — CBC WITH DIFFERENTIAL/PLATELET
Abs Immature Granulocytes: 0.02 K/uL (ref 0.00–0.07)
Basophils Absolute: 0 K/uL (ref 0.0–0.1)
Basophils Relative: 0 %
Eosinophils Absolute: 0.3 K/uL (ref 0.0–0.5)
Eosinophils Relative: 4 %
HCT: 35.8 % — ABNORMAL LOW (ref 36.0–46.0)
Hemoglobin: 12.1 g/dL (ref 12.0–15.0)
Immature Granulocytes: 0 %
Lymphocytes Relative: 7 %
Lymphs Abs: 0.5 K/uL — ABNORMAL LOW (ref 0.7–4.0)
MCH: 34.9 pg — ABNORMAL HIGH (ref 26.0–34.0)
MCHC: 33.8 g/dL (ref 30.0–36.0)
MCV: 103.2 fL — ABNORMAL HIGH (ref 80.0–100.0)
Monocytes Absolute: 0.7 K/uL (ref 0.1–1.0)
Monocytes Relative: 9 %
Neutro Abs: 5.8 K/uL (ref 1.7–7.7)
Neutrophils Relative %: 80 %
Platelets: 184 K/uL (ref 150–400)
RBC: 3.47 MIL/uL — ABNORMAL LOW (ref 3.87–5.11)
RDW: 11.6 % (ref 11.5–15.5)
WBC: 7.3 K/uL (ref 4.0–10.5)
nRBC: 0 % (ref 0.0–0.2)

## 2024-11-07 LAB — PRO BRAIN NATRIURETIC PEPTIDE: Pro Brain Natriuretic Peptide: 2151 pg/mL — ABNORMAL HIGH

## 2024-11-07 LAB — TROPONIN T, HIGH SENSITIVITY: Troponin T High Sensitivity: <15 ng/L (ref 0–19)

## 2024-11-07 LAB — RESP PANEL BY RT-PCR (RSV, FLU A&B, COVID)  RVPGX2
Influenza A by PCR: POSITIVE — AB
Influenza B by PCR: NEGATIVE
Resp Syncytial Virus by PCR: NEGATIVE
SARS Coronavirus 2 by RT PCR: NEGATIVE

## 2024-11-07 MED ORDER — IOHEXOL 350 MG/ML SOLN
100.0000 mL | Freq: Once | INTRAVENOUS | Status: AC | PRN
  Administered 2024-11-07: 100 mL via INTRAVENOUS

## 2024-11-07 MED ORDER — OSELTAMIVIR PHOSPHATE 75 MG PO CAPS
75.0000 mg | ORAL_CAPSULE | Freq: Once | ORAL | Status: AC
  Administered 2024-11-07: 75 mg via ORAL
  Filled 2024-11-07: qty 1

## 2024-11-07 MED ORDER — RIVAROXABAN 20 MG PO TABS: 20.0000 mg | ORAL_TABLET | Freq: Every day | ORAL | 2 refills | Status: AC

## 2024-11-07 MED ORDER — IOHEXOL 350 MG/ML SOLN: 75.0000 mL | Freq: Once | INTRAVENOUS | Status: DC | PRN

## 2024-11-07 MED ORDER — IPRATROPIUM-ALBUTEROL 0.5-2.5 (3) MG/3ML IN SOLN
3.0000 mL | RESPIRATORY_TRACT | Status: AC
  Administered 2024-11-07 (×3): 3 mL via RESPIRATORY_TRACT
  Filled 2024-11-07: qty 9

## 2024-11-07 MED ORDER — OSELTAMIVIR PHOSPHATE 75 MG PO CAPS: 75.0000 mg | ORAL_CAPSULE | Freq: Two times a day (BID) | ORAL | 0 refills | Status: AC

## 2024-11-07 MED ORDER — SODIUM CHLORIDE 0.9 % IV BOLUS
1000.0000 mL | Freq: Once | INTRAVENOUS | Status: AC
  Administered 2024-11-07: 1000 mL via INTRAVENOUS

## 2024-11-07 MED ORDER — PREDNISONE 20 MG PO TABS: 40.0000 mg | ORAL_TABLET | Freq: Every day | ORAL | 0 refills | Status: AC

## 2024-11-07 MED ORDER — METHYLPREDNISOLONE SODIUM SUCC 125 MG IJ SOLR
125.0000 mg | Freq: Once | INTRAMUSCULAR | Status: AC
  Administered 2024-11-07: 125 mg via INTRAVENOUS
  Filled 2024-11-07: qty 2

## 2024-11-07 MED ORDER — IBUPROFEN 400 MG PO TABS
400.0000 mg | ORAL_TABLET | Freq: Once | ORAL | Status: AC
  Administered 2024-11-07: 400 mg via ORAL
  Filled 2024-11-07: qty 1

## 2024-11-07 MED ORDER — HYDROCODONE BIT-HOMATROP MBR 5-1.5 MG/5ML PO SOLN
5.0000 mL | ORAL | Status: DC | PRN
  Administered 2024-11-07 (×2): 5 mL via ORAL
  Filled 2024-11-07 (×2): qty 5

## 2024-11-07 MED ORDER — ACETAMINOPHEN 325 MG PO TABS
650.0000 mg | ORAL_TABLET | Freq: Once | ORAL | Status: AC | PRN
  Administered 2024-11-07: 650 mg via ORAL
  Filled 2024-11-07: qty 2

## 2024-11-07 MED ORDER — GUAIFENESIN ER 600 MG PO TB12: 600.0000 mg | ORAL_TABLET | Freq: Two times a day (BID) | ORAL | 0 refills | Status: AC

## 2024-11-07 MED ORDER — ALBUTEROL SULFATE HFA 108 (90 BASE) MCG/ACT IN AERS: 2.0000 | INHALATION_SPRAY | RESPIRATORY_TRACT | 5 refills | Status: AC | PRN

## 2024-11-07 MED ORDER — HYDROCODONE BIT-HOMATROP MBR 5-1.5 MG/5ML PO SOLN: 5.0000 mL | ORAL | 0 refills | Status: AC | PRN

## 2024-11-07 NOTE — Discharge Instructions (Addendum)
 1)Avoid ibuprofen /Advil /Aleve/Motrin Josefine Powders/Naproxen/BC powders/Meloxicam/Diclofenac/Indomethacin and other Nonsteroidal anti-inflammatory medications as these will make you more likely to bleed and can cause stomach ulcers, can also cause Kidney problems.   2)Hold Lasix/Furosemide for 2 days to avoid dehydration in the setting of poor oral intake due to flu and insensible water  losses from fevers  3) please avoid strenuous activity for the next few days  4) drink plenty fluids and avoid dehydration  5) please take medications as prescribed  6)Watch for bleeding while on Blood Thinners--watch for blood in your stool which can make your stool black, maroon, mahogany or red---, blood in your urine which can make your urine pink or red, nosebleeds , also watch for possible bruising -You are taking Xarelto /rivaroxaban  --- which is a blood thinner--- be careful to avoid injury or falls

## 2024-11-07 NOTE — ED Notes (Signed)
 Patient transported to CT

## 2024-11-07 NOTE — Discharge Summary (Signed)
" °-  Patient improved significantly after treatment in the ED --- No further hypoxia even with ambulation - Patient discharged home from the ED with her husband  -Patient was not admitted to the hospital  Please see full consult note for same date of service  Rendall Carwin, MD  "

## 2024-11-07 NOTE — ED Provider Notes (Signed)
 " [place] EMERGENCY DEPARTMENT AT [facility] Provider Note   CSN: [id number] Arrival date & time: 11/06/24  2317     Patient presents with: Cough   Renee [name] is a 58 y.o. female.   58 year old female who presents ER today with fever, chills, cough, shortness of breath, generalized weakness and nausea starting at 4:00 in the morning.  She when she went to sleep on Thursday evening she felt fine but at 4:00 the morning on Friday she woke up with all the symptoms.  She was recently diagnosed with a chronic pulmonary embolus has started blood thinners for that.  No history of heart failure.  She has a distant history of smoking approximately 10 pack years but more recently she is started vaping.  Never been diagnosed with asthma or COPD.  Multiple sick contacts on Christmas but none prior to that.  No lower extremity swelling.   Cough      Prior to Admission medications  Medication Sig Start Date End Date Taking? Authorizing Provider  acetaminophen  (TYLENOL ) 500 MG tablet Take 1,000 mg by mouth every 6 (six) hours as needed for mild pain or headache.    [provider]  amphetamine -dextroamphetamine  (ADDERALL) 30 MG tablet Take 0.5 tablets by mouth See admin instructions. Take 1/2 tablet by mouth in the morning and 1/2 at noon    [provider]  ARIPiprazole  (ABILIFY ) 10 MG tablet Take 1 tablet (10 mg total) by mouth daily. 02/11/23 04/12/23  ShahmehdiAdriana LABOR, MD  esomeprazole (NEXIUM) 20 MG capsule Take 20 mg by mouth daily.    [provider]  hydrOXYzine  (VISTARIL ) 25 MG capsule Take 1 capsule (25 mg total) by mouth at bedtime as needed (for sleep). 02/11/23   Shahmehdi, Adriana LABOR, MD  ibuprofen  (ADVIL ) 200 MG tablet Take 400 mg by mouth every 6 (six) hours as needed for moderate pain.    [provider]  metoprolol  succinate (TOPROL -XL) 100 MG 24 hr tablet Take 1 tablet (100 mg total) by mouth daily. Take with or immediately following a  meal. 02/12/23 04/13/23  Shahmehdi, Adriana LABOR, MD  Multiple Vitamin (MULTIVITAMIN WITH MINERALS) TABS tablet Take 1 tablet by mouth daily. Patient not taking: Reported on 02/06/2023 08/19/21   Pearlean Manus, MD  nicotine  (NICODERM CQ  - DOSED IN MG/24 HOURS) 21 mg/24hr patch Place 1 patch (21 mg total) onto the skin daily. 02/12/23   Shahmehdi, Adriana LABOR, MD  ondansetron  (ZOFRAN ) 4 MG tablet Take 1 tablet every 8 hours by oral route as needed for 10 days. 05/21/22   [provider]  PARoxetine  (PAXIL ) 40 MG tablet Take 1 tablet (40 mg total) by mouth every morning. 08/23/21   Antonetta Rollene BRAVO, MD  RIVAROXABAN  (XARELTO ) VTE STARTER PACK (15 & 20 MG) Follow package directions: Take one 15mg  tablet by mouth twice a day. On day 22, switch to one 20mg  tablet once a day. Take with food. 10/12/24   Garrick Charleston, MD  rosuvastatin  (CRESTOR ) 5 MG tablet Take 1 tablet (5 mg total) by mouth daily. 02/11/23   Shahmehdi, Adriana LABOR, MD  Semaglutide , 1 MG/DOSE, 4 MG/3ML SOPN Inject 1 mg as directed once a week. Patient not taking: Reported on 02/06/2023 10/27/21   Tobie Suzzane POUR, MD    Allergies: Librium  [chlordiazepoxide ]    Review of Systems  Respiratory:  Positive for cough.     Updated Vital Signs BP 128/89   Pulse 94   Temp (!) 101.2 F (38.4 C) (  Oral)   Resp (!) 24   Ht 5' 9 (1.753 m)   Wt 111.6 kg   SpO2 97%   BMI 36.33 kg/m   Physical Exam Vitals and nursing note reviewed.  Constitutional:      Appearance: She is well-developed.  HENT:     Head: Normocephalic and atraumatic.  Cardiovascular:     Rate and Rhythm: Normal rate and regular rhythm.  Pulmonary:     Effort: Tachypnea present. No respiratory distress.     Breath sounds: Decreased air movement present. No stridor. Wheezing present.  Abdominal:     General: There is no distension.  Musculoskeletal:     Cervical back: Normal range of motion.  Neurological:     Mental Status: She is alert.     (all labs ordered are  listed, but only abnormal results are displayed) Labs Reviewed  RESP PANEL BY RT-PCR (RSV, FLU A&B, COVID)  RVPGX2 - Abnormal; Notable for the following components:      Result Value   Influenza A by PCR POSITIVE (*)    All other components within normal limits  CBC WITH DIFFERENTIAL/PLATELET - Abnormal; Notable for the following components:   RBC 3.47 (*)    HCT 35.8 (*)    MCV 103.2 (*)    MCH 34.9 (*)    Lymphs Abs 0.5 (*)    All other components within normal limits  COMPREHENSIVE METABOLIC PANEL WITH GFR - Abnormal; Notable for the following components:   Sodium 130 (*)    Chloride 94 (*)    Glucose, Bld 114 (*)    Calcium  8.7 (*)    AST 60 (*)    All other components within normal limits  PRO BRAIN NATRIURETIC PEPTIDE - Abnormal; Notable for the following components:   Pro Brain Natriuretic Peptide 2,151.0 (*)    All other components within normal limits  TROPONIN T, HIGH SENSITIVITY    EKG: None  Radiology: CT Angio Chest PE W and/or Wo Contrast Result Date: 11/07/2024 EXAM: CTA CHEST 11/07/2024 05:17:04 AM TECHNIQUE: CTA of the chest was performed without and with the administration of 100 mL of intravenous contrast (iohexol  (OMNIPAQUE ) 350 MG/ML injection 100 mL IOHEXOL  350 MG/ML SOLN). Multiplanar reformatted images are provided for review. MIP images are provided for review. Automated exposure control, iterative reconstruction, and/or weight based adjustment of the mA/kV was utilized to reduce the radiation dose to as low as reasonably achievable. COMPARISON: Portable chest today, CTA chest 10/12/2024, and chest CT without contrast 11/18/2020. CLINICAL HISTORY: Coughing, fever, and shortness of breath, with recent sick contacts at home. Pulmonary embolism is suspected. FINDINGS: PULMONARY ARTERIES: Pulmonary arteries are adequately opacified for evaluation. The pulmonary trunk measures 3.3 cm, indicating arterial hypertension; it was previously 2.8 cm. Noted is a very thin  wispy filling defect in the proximal right lower lobe pulmonary artery in keeping with chronic thrombus on series 14 axial 71-73. No arterial filling defect is seen suspicious for an acute embolism. Small peripheral arteries are not well seen due to streak artifacts from patient body habitus and the patient's arms in the field. MEDIASTINUM: The heart is slightly enlarged. No pericardial effusion. A small amount of scattered 2-vessel positive plaque is noted in the LAD and circumflex coronary arteries. There is mild aortic atherosclerosis and tortuosity without aneurysm, dissection, or stenosis. Minimal calcific plaques in the great vessels. The pulmonary veins are nondistended. LYMPH NODES: No mediastinal, hilar or axillary lymphadenopathy. LUNGS AND PLEURA: There is mild centrilobular emphysema and  scarring in the lung apices. There is diffuse bronchial thickening. There is a chronic 5 mm lingular nodule on series 20 axial 104. Chronic 3 mm right middle lobe nodule on axial 78. 2 mm right middle lobe nodule on axial 63. Benign process is presumed given the length of stability. There is no consolidation, effusion, or pneumothorax. Mild chronic elevation again noted of the right hemidiaphragm. UPPER ABDOMEN: The liver is moderately steatotic. Status post cholecystectomy. No acute upper abdominal findings. SOFT TISSUES AND BONES: There is spondylosis and bridging enthesopathy of the thoracic spine. No acute or significant osseous findings. No acute soft tissue abnormality. IMPRESSION: 1. No evidence of acute pulmonary embolism. 2. Minimal mole Chronic thrombus in the proximal right lower lobe pulmonary artery. 3. Enlarged pulmonary trunk measuring 3.3 cm, increased from 2.8 cm, compatible with pulmonary arterial hypertension. 4. Diffuse bronchial thickening without consolidation, pleural effusion, or pneumothorax. 5. Mild centrilobular emphysema and apical scarring, and pulmonary emphysema is an independent risk  factor for lung cancer and recommend consideration for evaluation for a low-dose CT lung cancer screening program. Electronically signed by: Francis Quam MD 11/07/2024 05:48 AM EST RP Workstation: [id number]   DG Chest Portable 1 View Result Date: 11/07/2024 EXAM: 1 VIEW(S) XRAY OF THE CHEST 11/07/2024 01:04:13 AM COMPARISON: CTA chest 10/12/2024. CLINICAL HISTORY: cough; shob FINDINGS: LUNGS AND PLEURA: The lungs are mildly emphysematous. No focal pulmonary opacity. No pleural effusion. No pneumothorax. HEART AND MEDIASTINUM: No acute abnormality of the cardiac and mediastinal silhouettes. BONES AND SOFT TISSUES: There are healed fractures of the posterior right rib cage. IMPRESSION: 1. No acute findings. 2. Mildly emphysematous lungs. Electronically signed by: Francis Quam MD 11/07/2024 01:17 AM EST RP Workstation: [id number]     .Critical Care  Performed by: Lorette Mayo, MD Authorized by: Lorette Mayo, MD   Critical care provider statement:    Critical care time (minutes):  30   Critical care was necessary to treat or prevent imminent or life-threatening deterioration of the following conditions:  Respiratory failure and sepsis   Critical care was time spent personally by me on the following activities:  Development of treatment plan with patient or surrogate, discussions with consultants, evaluation of patient's response to treatment, examination of patient, ordering and review of laboratory studies, ordering and review of radiographic studies, ordering and performing treatments and interventions, pulse oximetry, re-evaluation of patient's condition and review of old charts    Medications Ordered in the ED  HYDROcodone  bit-homatropine (HYCODAN) 5-1.5 MG/5ML syrup 5 mL (5 mLs Oral Given 11/07/24 0527)  iohexol  (OMNIPAQUE ) 350 MG/ML injection 75 mL (has no administration in time range)  ipratropium-albuterol  (DUONEB) 0.5-2.5 (3) MG/3ML nebulizer solution 3 mL (3 mLs Nebulization Given  11/07/24 0155)  acetaminophen  (TYLENOL ) tablet 650 mg (650 mg Oral Given 11/07/24 0258)  iohexol  (OMNIPAQUE ) 350 MG/ML injection 100 mL (100 mLs Intravenous Contrast Given 11/07/24 0358)  ibuprofen  (ADVIL ) tablet 400 mg (400 mg Oral Given 11/07/24 0355)  oseltamivir  (TAMIFLU ) capsule 75 mg (75 mg Oral Given 11/07/24 0523)                                    Medical Decision Making Amount and/or Complexity of Data Reviewed Labs: ordered. Radiology: ordered.  Risk OTC drugs. Prescription drug management. Decision regarding hospitalization.  Patient found to have influenza which could very well explain all of her symptoms including bronchitis however also her BNP is quite elevated and  some emphysematous changes on her x-ray which do not make a lot of sense since she does not have emphysema.  Will repeat a CT scan to get a better look at her lungs and just to rule out any new thromboembolism.  Patient will need to be admitted for further workup and management.  Tamiflu  given secondary to early flu with emphysema and likely admission to the hospital. Low likelihood of bacterial infection at this time, will hold on antibiotics.  Held on Lasix secondary to the concurrent viral infection and not want to make her hypovolemic.  Discussed with hospitalist for observation stay for echo and repeat exams and evaluation for improvement in symptoms.   Final diagnoses:  Influenza A  Elevated brain natriuretic peptide (BNP) level  Bronchitis  Acute respiratory failure with hypoxia [facility])    ED Discharge Orders     None          Olive Zmuda, Selinda, MD 11/07/24 [phone]  "

## 2024-11-07 NOTE — Consult Note (Addendum)
 Initial Consultation Note   Patient: Renee [name] FMW:[id number] DOB: 1966/04/04 PCP: Myra Geni ORN, FNP DOA: 11/07/2024 DOS: the patient was seen and examined on 11/07/2024 Primary service: No att. providers found  Referring physician: EDP Reason for consult: Influenza A  Assessment/Plan: Assessment and Plan: 1) influenza A positive--- patient presented with fevers cough congestion And transient hypoxia -- Symptoms are just over 24 hours old - She responded well to IV Solu-Medrol , bronchodilators, antitussives and supplemental oxygen -- She was subsequently weaned off oxygen -Patient ambulated around the ED with O2 sats around 90% with ambulation -Orthostatic vitals improved significantly after IV fluids, patient will hold PTA Lasix for the next 48 hours - Okay to discharge on Tamiflu , prednisone , albuterol  and guaifenesin   2)PAFib--stable at this time continue metoprolol  for rate control and Xarelto  for stroke prophylaxis  3)GERD--stable, continue Meprazole especially while on steroids  4)HTN--stable, continue lisinopril and metoprolol   5)HLD--- continue Crestor   6)Class II obesity- -Low calorie diet, portion control and increase physical activity discussed with patient once acute medical issues have resolved -Body mass index is 36.33 kg/m.  7) acute hypoxic respiratory failure--- due to influenza A, resolved with interventions as noted above #1  8) isolated AST elevation--- consistent with ongoing alcohol abuse - Abstinence from alcohol advised  9) mild hyponatremia/hypochloremia--- in the setting of Lasix use PTA compounded by poor oral intake due to influenza A infection -- Patient received IV fluids in the ED - Lasix on hold for the next 48 hours until oral intake improves  -Patient improved significantly with measures as above #1 and #7 - No further hypoxia even with ambulation - Patient discharged home from the ED with her husband  Discharge  Instructions:- 1)Avoid ibuprofen /Advil /Aleve/Motrin Josefine Powders/Naproxen/BC powders/Meloxicam/Diclofenac/Indomethacin and other Nonsteroidal anti-inflammatory medications as these will make you more likely to bleed and can cause stomach ulcers, can also cause Kidney problems.   2)Hold Lasix/Furosemide for 2 days to avoid dehydration in the setting of poor oral intake due to flu and insensible water  losses from fevers  3) please avoid strenuous activity for the next few days  4) drink plenty fluids and avoid dehydration  5) please take medications as prescribed  6)Watch for bleeding while on Blood Thinners--watch for blood in your stool which can make your stool black, maroon, mahogany or red---, blood in your urine which can make your urine pink or red, nosebleeds , also watch for possible bruising -You are taking Xarelto /rivaroxaban  --- which is a blood thinner--- be careful to avoid injury or falls   Allergies as of 11/07/2024       Reactions   Librium  [chlordiazepoxide ] Other (See Comments)   Does not like the way it makes her feel        Medication List     PAUSE taking these medications    furosemide 20 MG tablet Wait to take this until: November 10, 2024 Morning Commonly known as: LASIX Take 20 mg by mouth daily.       STOP taking these medications    Rivaroxaban  Starter Pack (15 mg and 20 mg) Commonly known as: XARELTO  STARTER PACK Replaced by: rivaroxaban  20 MG Tabs tablet       TAKE these medications    acetaminophen  500 MG tablet Commonly known as: TYLENOL  Take 1,000 mg by mouth every 6 (six) hours as needed for mild pain or headache.   albuterol  108 (90 Base) MCG/ACT inhaler Commonly known as: VENTOLIN  HFA Inhale 2 puffs into the lungs every 4 (four) hours  as needed for wheezing or shortness of breath. What changed: reasons to take this   amphetamine -dextroamphetamine  30 MG tablet Commonly known as: ADDERALL Take 15 mg by mouth 2 (two) times  daily. Take 1/2 tablet by mouth in the morning and 1/2 at noon   guaiFENesin  600 MG 12 hr tablet Commonly known as: Mucinex  Take 1 tablet (600 mg total) by mouth 2 (two) times daily.   HYDROcodone  bit-homatropine 5-1.5 MG/5ML syrup Commonly known as: HYCODAN Take 5 mLs by mouth every 4 (four) hours as needed for cough.   lisinopril 20 MG tablet Commonly known as: ZESTRIL Take 20 mg by mouth daily.   metoprolol  succinate 100 MG 24 hr tablet Commonly known as: TOPROL -XL Take 1 tablet (100 mg total) by mouth daily. Take with or immediately following a meal.   omeprazole 20 MG capsule Commonly known as: PRILOSEC Take 20 mg by mouth daily.   ondansetron  4 MG disintegrating tablet Commonly known as: ZOFRAN -ODT Take 4 mg by mouth every 8 (eight) hours as needed for nausea or vomiting.   oseltamivir  75 MG capsule Commonly known as: Tamiflu  Take 1 capsule (75 mg total) by mouth 2 (two) times daily for 5 days.   PARoxetine  40 MG tablet Commonly known as: Paxil  Take 1 tablet (40 mg total) by mouth every morning.   predniSONE  20 MG tablet Commonly known as: DELTASONE  Take 2 tablets (40 mg total) by mouth daily with breakfast for 5 days.   rivaroxaban  20 MG Tabs tablet Commonly known as: XARELTO  Take 1 tablet (20 mg total) by mouth daily with supper. Replaces: Rivaroxaban  Starter Pack (15 mg and 20 mg)   rosuvastatin  5 MG tablet Commonly known as: Crestor  Take 1 tablet (5 mg total) by mouth daily.       TR[facility] will continue to follow the patient.  HPI: Renee [name] is a 58 y.o. female reformed smoker with past medical history remarkable for alcohol abuse, paroxysmal A-fib on Xarelto  for anticoagulation, HTN and HLD and obesity presents to the ED with about 24-hour history of fevers, cough, congestion and dyspnea - No sick contacts at home - Patient's husband who is about 9 years older is present and he is asymptomatic - additional history obtained from patient's husband at  bedside - - Patient with fevers but no vomiting or diarrhea - Patient admits to poor appetite and poor oral intake over the last couple days - No chest pains or palpitations - She had some orthostatic dizziness earlier improved with IV fluids in the ED -- CT angio chest without acute PE, evidence of pulmonary arterial hypertension noted, emphysematous changes noted, - Troponin is 15, elevated proBNP noted - Sodium is 130, potassium is 4.2, chloride of 94, glucose is 114, creatinine 0.76, AST 60, ALT is only 26, otherwise LFTs are WNL -- Influenza A+, RSV and COVID-negative - WBC 7.3 hemoglobin is 12.1 platelets 184  Review of Systems: As mentioned in the history of present illness. All other systems reviewed and are negative. Past Medical History:  Diagnosis Date   Alcohol abuse    Depression    History of total abdominal hysterectomy    Hypertension    PAF (paroxysmal atrial fibrillation) (HCC)    Past Surgical History:  Procedure Laterality Date   CHOLECYSTECTOMY     COLONOSCOPY N/A 01/26/2021   Procedure: COLONOSCOPY;  Surgeon: Golda Claudis PENNER, MD;  Location: [facility];  Service: Endoscopy;  Laterality: N/A;  AM   COLONOSCOPY N/A 09/14/2021   Procedure:  COLONOSCOPY;  Surgeon: Golda Claudis PENNER, MD;  Location: [facility];  Service: Endoscopy;  Laterality: N/A;  10:30   POLYPECTOMY  09/14/2021   Procedure: POLYPECTOMY;  Surgeon: Golda Claudis PENNER, MD;  Location: [facility];  Service: Endoscopy;;   SMALL INTESTINE SURGERY N/A    TONSILLECTOMY     TOTAL ABDOMINAL HYSTERECTOMY     Social History:  reports that she quit smoking about 7 years ago. Her smoking use included cigarettes. She has never used smokeless tobacco. She reports current alcohol use. She reports that she does not use drugs.  Allergies[1]  Family History  Problem Relation Age of Onset   Hypertension Father     Prior to Admission medications  Medication Sig Start Date End Date Taking? Authorizing  Provider  acetaminophen  (TYLENOL ) 500 MG tablet Take 1,000 mg by mouth every 6 (six) hours as needed for mild pain or headache.   Yes [provider]  amphetamine -dextroamphetamine  (ADDERALL) 30 MG tablet Take 15 mg by mouth 2 (two) times daily. Take 1/2 tablet by mouth in the morning and 1/2 at noon   Yes [provider]  [Paused] furosemide (LASIX) 20 MG tablet Take 20 mg by mouth daily. Wait to take this until: November 10, 2024 Morning   Yes [provider]  guaiFENesin  (MUCINEX ) 600 MG 12 hr tablet Take 1 tablet (600 mg total) by mouth 2 (two) times daily. 11/07/24  Yes Tamorah Hada, MD  lisinopril (ZESTRIL) 20 MG tablet Take 20 mg by mouth daily. 10/07/24  Yes [provider]  metoprolol  succinate (TOPROL -XL) 100 MG 24 hr tablet Take 1 tablet (100 mg total) by mouth daily. Take with or immediately following a meal. 02/12/23 11/07/24 Yes Shahmehdi, Seyed A, MD  omeprazole (PRILOSEC) 20 MG capsule Take 20 mg by mouth daily.   Yes [provider]  ondansetron  (ZOFRAN -ODT) 4 MG disintegrating tablet Take 4 mg by mouth every 8 (eight) hours as needed for nausea or vomiting.   Yes [provider]  oseltamivir  (TAMIFLU ) 75 MG capsule Take 1 capsule (75 mg total) by mouth 2 (two) times daily for 5 days. 11/07/24 11/12/24 Yes Jamee Pacholski, MD  PARoxetine  (PAXIL ) 40 MG tablet Take 1 tablet (40 mg total) by mouth every morning. 08/23/21  Yes Antonetta Rollene BRAVO, MD  predniSONE  (DELTASONE ) 20 MG tablet Take 2 tablets (40 mg total) by mouth daily with breakfast for 5 days. 11/07/24 11/12/24 Yes Helon Wisinski, MD  rivaroxaban  (XARELTO ) 20 MG TABS tablet Take 1 tablet (20 mg total) by mouth daily with supper. 11/07/24  Yes Luba Matzen, MD  rosuvastatin  (CRESTOR ) 5 MG tablet Take 1 tablet (5 mg total) by mouth daily. 02/11/23  Yes Shahmehdi, Adriana LABOR, MD  albuterol  (VENTOLIN  HFA) 108 (90 Base) MCG/ACT inhaler Inhale 2 puffs into the lungs every 4  (four) hours as needed for wheezing or shortness of breath. 11/07/24   Pearlean Manus, MD  HYDROcodone  bit-homatropine (HYCODAN) 5-1.5 MG/5ML syrup Take 5 mLs by mouth every 4 (four) hours as needed for cough. 11/07/24   Pearlean Manus, MD    Physical Exam: Vitals:   11/07/24 1122 11/07/24 1123 11/07/24 1200 11/07/24 1230  BP:   132/74   Pulse: 75 79  79  Resp: (!) 23 (!) 23  16  Temp:      TempSrc:      SpO2: (!) 88% (!) 87%  94%  Weight:      Height:        Physical Exam  Gen:- Awake Alert, in no acute distress, speaking in sentences after bronchodilators and steroids HEENT:- [place].AT, No sclera icterus Neck-Supple Neck,No JVD,.  Lungs-much improved air movement, no further wheezing after steroids and bronchodilators  CV- S1, S2 normal, RRR Abd-  +ve B.Sounds, Abd Soft, No tenderness,    Extremity/Skin:- No  edema,   good pedal pulses  Psych-affect is appropriate, oriented x3 Neuro-no new focal deficits, no tremors  Data Reviewed:  CT angio chest without acute PE, evidence of pulmonary arterial hypertension noted, emphysematous changes noted, - Troponin is 15, elevated proBNP noted - Sodium is 130, potassium is 4.2, chloride of 94, glucose is 114, creatinine 0.76, AST 60, ALT is only 26, otherwise LFTs are WNL -- Influenza A+, RSV and COVID-negative - WBC 7.3 hemoglobin is 12.1 platelets 184  Family Communication: Discussed with patient's husband at bedside  Thank you very much for involving us  in the care of your patient.  Author: Rendall Carwin, MD 11/07/2024 2:27 PM  For on call review [email or web address].      [1]  Allergies Allergen Reactions   Librium  [Chlordiazepoxide ] Other (See Comments)    Does not like the way it makes her feel

## 2024-11-07 NOTE — Progress Notes (Incomplete)
 Prior to study, this CT technologist hand flushed IV with saline and received slight blood return with no complaint of pain from patient. Prior to contrast injection, the IV was also power injected with a test saline bolus. The patient said it did slightly sting, but no extreme pain or swelling noted. After starting the contrast for the angio study, contrast appeared to be injecting normally, and the patient did not have complaint. Over a few seconds, this technologist noticed the contrast diminishing, and stopped the scan. The patient still had no complaint. A scout scan was completed to show contrast ended up extravasating into the patients arm. The IV was promptly removed, upon which the patient then said it was hurting, and the IV site felt hardened. An ice pack was given to the patient, and the patient was given instruction to cycle applying the ice pack to her arm, and post care. The RN and MD were notified also. MD was asked to assess the site of infiltration due to the amount of contrast extravasated.

## 2024-11-07 NOTE — ED Notes (Addendum)
 Pt to be D/C from ED per primary nurse.

## 2024-11-07 NOTE — ED Triage Notes (Signed)
 Says she woke up abruptly ~0400 this morning coughing.   Had a fever that responded with Tylenol , Cough unresolved after multiple OTC medications.   Says she was around family members for Christmas who felt unwell.

## 2024-12-15 ENCOUNTER — Ambulatory Visit: Payer: Self-pay | Admitting: Cardiology

## 2025-02-16 ENCOUNTER — Ambulatory Visit: Admitting: Cardiology
